# Patient Record
Sex: Female | Born: 1965 | Race: White | Hispanic: No | Marital: Married | Smoking: Former smoker
Health system: Southern US, Community
[De-identification: ages and names within clinical notes are randomized; demographics above are authoritative.]

## PROBLEM LIST (undated history)

## (undated) DIAGNOSIS — M51369 Other intervertebral disc degeneration, lumbar region without mention of lumbar back pain or lower extremity pain: Secondary | ICD-10-CM

## (undated) DIAGNOSIS — I83891 Varicose veins of right lower extremities with other complications: Secondary | ICD-10-CM

## (undated) DIAGNOSIS — M48061 Spinal stenosis, lumbar region without neurogenic claudication: Secondary | ICD-10-CM

## (undated) DIAGNOSIS — I83893 Varicose veins of bilateral lower extremities with other complications: Secondary | ICD-10-CM

## (undated) DIAGNOSIS — K449 Diaphragmatic hernia without obstruction or gangrene: Secondary | ICD-10-CM

## (undated) DIAGNOSIS — R1011 Right upper quadrant pain: Secondary | ICD-10-CM

## (undated) DIAGNOSIS — M47817 Spondylosis without myelopathy or radiculopathy, lumbosacral region: Secondary | ICD-10-CM

## (undated) DIAGNOSIS — M519 Unspecified thoracic, thoracolumbar and lumbosacral intervertebral disc disorder: Secondary | ICD-10-CM

## (undated) DIAGNOSIS — M7989 Other specified soft tissue disorders: Secondary | ICD-10-CM

## (undated) DIAGNOSIS — M5136 Other intervertebral disc degeneration, lumbar region: Secondary | ICD-10-CM

## (undated) DIAGNOSIS — M47816 Spondylosis without myelopathy or radiculopathy, lumbar region: Secondary | ICD-10-CM

## (undated) DIAGNOSIS — I83812 Varicose veins of left lower extremities with pain: Secondary | ICD-10-CM

## (undated) DIAGNOSIS — K5900 Constipation, unspecified: Secondary | ICD-10-CM

## (undated) DIAGNOSIS — F32A Depression, unspecified: Secondary | ICD-10-CM

## (undated) DIAGNOSIS — E7800 Pure hypercholesterolemia, unspecified: Secondary | ICD-10-CM

## (undated) DIAGNOSIS — I341 Nonrheumatic mitral (valve) prolapse: Secondary | ICD-10-CM

## (undated) DIAGNOSIS — R51 Headache: Secondary | ICD-10-CM

## (undated) DIAGNOSIS — K859 Acute pancreatitis without necrosis or infection, unspecified: Secondary | ICD-10-CM

## (undated) DIAGNOSIS — N8110 Cystocele, unspecified: Secondary | ICD-10-CM

## (undated) DIAGNOSIS — R0602 Shortness of breath: Secondary | ICD-10-CM

## (undated) DIAGNOSIS — N809 Endometriosis, unspecified: Secondary | ICD-10-CM

## (undated) DIAGNOSIS — E785 Hyperlipidemia, unspecified: Secondary | ICD-10-CM

## (undated) DIAGNOSIS — I454 Nonspecific intraventricular block: Secondary | ICD-10-CM

## (undated) DIAGNOSIS — G4733 Obstructive sleep apnea (adult) (pediatric): Secondary | ICD-10-CM

## (undated) DIAGNOSIS — E039 Hypothyroidism, unspecified: Secondary | ICD-10-CM

## (undated) DIAGNOSIS — D219 Benign neoplasm of connective and other soft tissue, unspecified: Secondary | ICD-10-CM

## (undated) DIAGNOSIS — K760 Fatty (change of) liver, not elsewhere classified: Secondary | ICD-10-CM

## (undated) DIAGNOSIS — N83519 Torsion of ovary and ovarian pedicle, unspecified side: Secondary | ICD-10-CM

## (undated) DIAGNOSIS — D352 Benign neoplasm of pituitary gland: Secondary | ICD-10-CM

## (undated) DIAGNOSIS — F419 Anxiety disorder, unspecified: Secondary | ICD-10-CM

## (undated) DIAGNOSIS — F319 Bipolar disorder, unspecified: Secondary | ICD-10-CM

## (undated) HISTORY — DX: Obstructive sleep apnea (adult) (pediatric): G47.33

## (undated) HISTORY — DX: Constipation, unspecified: K59.00

## (undated) HISTORY — DX: Anxiety disorder, unspecified: F41.9

## (undated) HISTORY — DX: Hyperlipidemia, unspecified: E78.5

## (undated) HISTORY — PX: DILATION AND CURETTAGE OF UTERUS: SHX78

## (undated) HISTORY — PX: CHOLECYSTECTOMY: SHX55

## (undated) HISTORY — DX: Depression, unspecified: F32.A

## (undated) HISTORY — PX: HERNIA REPAIR: SHX51

## (undated) HISTORY — PX: OTHER SURGICAL HISTORY: SHX169

## (undated) HISTORY — PX: OOPHORECTOMY: SHX86

## (undated) HISTORY — PX: ABDOMINAL HYSTERECTOMY: SHX81

## (undated) HISTORY — DX: Fatty (change of) liver, not elsewhere classified: K76.0

## (undated) HISTORY — PX: APPENDECTOMY: SHX54

## (undated) HISTORY — DX: Benign neoplasm of pituitary gland: D35.2

## (undated) HISTORY — PX: COLONOSCOPY: SHX174

## (undated) MED FILL — OXYCODONE-ACETAMINOPHEN 5-325 TABS: 5-325 MG | 3 days supply | Qty: 12 | Fill #0 | Status: AC

---

## 2012-04-19 ENCOUNTER — Other Ambulatory Visit: Payer: Self-pay | Admitting: Internal Medicine

## 2012-04-19 DIAGNOSIS — Z1231 Encounter for screening mammogram for malignant neoplasm of breast: Secondary | ICD-10-CM

## 2012-05-24 ENCOUNTER — Ambulatory Visit: Payer: Self-pay

## 2012-06-03 ENCOUNTER — Ambulatory Visit: Payer: Self-pay

## 2013-08-16 ENCOUNTER — Other Ambulatory Visit: Payer: Self-pay | Admitting: Obstetrics and Gynecology

## 2013-08-16 DIAGNOSIS — Z1231 Encounter for screening mammogram for malignant neoplasm of breast: Secondary | ICD-10-CM

## 2013-08-31 ENCOUNTER — Ambulatory Visit
Admission: RE | Admit: 2013-08-31 | Discharge: 2013-08-31 | Disposition: A | Discharge status: Home / Self Care | Payer: Federal, State, Local not specified - PPO | Source: Ambulatory Visit | Attending: Obstetrics and Gynecology | Admitting: Obstetrics and Gynecology

## 2013-08-31 DIAGNOSIS — Z1231 Encounter for screening mammogram for malignant neoplasm of breast: Secondary | ICD-10-CM

## 2013-09-05 ENCOUNTER — Encounter (HOSPITAL_COMMUNITY): Payer: Self-pay | Admitting: Emergency Medicine

## 2013-09-05 ENCOUNTER — Emergency Department (HOSPITAL_COMMUNITY): Payer: Federal, State, Local not specified - PPO

## 2013-09-05 ENCOUNTER — Observation Stay (HOSPITAL_COMMUNITY)
Admission: EM | Admit: 2013-09-05 | Discharge: 2013-09-06 | Disposition: A | Discharge status: Home / Self Care | Payer: Federal, State, Local not specified - PPO | Attending: Cardiovascular Disease | Admitting: Cardiovascular Disease

## 2013-09-05 DIAGNOSIS — R0602 Shortness of breath: Secondary | ICD-10-CM | POA: Insufficient documentation

## 2013-09-05 DIAGNOSIS — R42 Dizziness and giddiness: Secondary | ICD-10-CM | POA: Insufficient documentation

## 2013-09-05 DIAGNOSIS — I454 Nonspecific intraventricular block: Secondary | ICD-10-CM | POA: Insufficient documentation

## 2013-09-05 DIAGNOSIS — I059 Rheumatic mitral valve disease, unspecified: Secondary | ICD-10-CM | POA: Insufficient documentation

## 2013-09-05 DIAGNOSIS — I341 Nonrheumatic mitral (valve) prolapse: Secondary | ICD-10-CM | POA: Diagnosis present

## 2013-09-05 DIAGNOSIS — E7800 Pure hypercholesterolemia, unspecified: Secondary | ICD-10-CM

## 2013-09-05 DIAGNOSIS — R079 Chest pain, unspecified: Principal | ICD-10-CM

## 2013-09-05 HISTORY — DX: Pure hypercholesterolemia, unspecified: E78.00

## 2013-09-05 HISTORY — DX: Nonrheumatic mitral (valve) prolapse: I34.1

## 2013-09-05 HISTORY — DX: Nonspecific intraventricular block: I45.4

## 2013-09-05 LAB — BASIC METABOLIC PANEL
BUN: 10 mg/dL (ref 6–23)
CHLORIDE: 102 meq/L (ref 96–112)
CO2: 25 mEq/L (ref 19–32)
Calcium: 9.7 mg/dL (ref 8.4–10.5)
Creatinine, Ser: 0.71 mg/dL (ref 0.50–1.10)
GFR calc non Af Amer: >90 mL/min (ref >90)
Glucose, Bld: 98 mg/dL (ref 70–99)
POTASSIUM: 4.2 meq/L (ref 3.7–5.3)
Sodium: 140 mEq/L (ref 137–147)

## 2013-09-05 LAB — CBC
HCT: 37.3 % (ref 36.0–46.0)
HCT: 34.7 % — ABNORMAL LOW (ref 36.0–46.0)
Hemoglobin: 12.8 g/dL (ref 12.0–15.0)
Hemoglobin: 11.7 g/dL — ABNORMAL LOW (ref 12.0–15.0)
MCH: 28.5 pg (ref 26.0–34.0)
MCH: 28 pg (ref 26.0–34.0)
MCHC: 34.3 g/dL (ref 30.0–36.0)
MCHC: 33.7 g/dL (ref 30.0–36.0)
MCV: 83.1 fL (ref 78.0–100.0)
MCV: 83 fL (ref 78.0–100.0)
PLATELETS: 259 10*3/uL (ref 150–400)
PLATELETS: 242 10*3/uL (ref 150–400)
RBC: 4.49 MIL/uL (ref 3.87–5.11)
RBC: 4.18 MIL/uL (ref 3.87–5.11)
RDW: 13.6 % (ref 11.5–15.5)
RDW: 13.7 % (ref 11.5–15.5)
WBC: 9.7 10*3/uL (ref 4.0–10.5)
WBC: 9.7 10*3/uL (ref 4.0–10.5)

## 2013-09-05 LAB — PROTIME-INR
INR: 1.06 (ref 0.00–1.49)
Prothrombin Time: 13.6 seconds (ref 11.6–15.2)

## 2013-09-05 LAB — D-DIMER, QUANTITATIVE (NOT AT ARMC)

## 2013-09-05 LAB — TROPONIN I
Troponin I: <0.3 ng/mL (ref <0.30)
Troponin I: <0.3 ng/mL (ref <0.30)

## 2013-09-05 LAB — CREATININE, SERUM
CREATININE: 0.79 mg/dL (ref 0.50–1.10)
GFR calc non Af Amer: >90 mL/min (ref >90)

## 2013-09-05 LAB — I-STAT TROPONIN, ED: Troponin i, poc: 0 ng/mL (ref 0.00–0.08)

## 2013-09-05 LAB — APTT: APTT: 29 s (ref 24–37)

## 2013-09-05 LAB — MRSA PCR SCREENING: MRSA by PCR: NEGATIVE

## 2013-09-05 MED ORDER — HEPARIN SODIUM (PORCINE) 5000 UNIT/ML IJ SOLN
5000.0000 [IU] | Freq: Three times a day (TID) | INTRAMUSCULAR | Status: DC
  Administered 2013-09-05 – 2013-09-06 (×3): 5000 [IU] via SUBCUTANEOUS
  Filled 2013-09-05 (×6): qty 1

## 2013-09-05 MED ORDER — LEVOTHYROXINE SODIUM 88 MCG PO TABS
88.0000 ug | ORAL_TABLET | Freq: Every day | ORAL | Status: DC
  Administered 2013-09-06: 88 ug via ORAL
  Filled 2013-09-05 (×2): qty 1

## 2013-09-05 MED ORDER — NITROGLYCERIN 0.4 MG SL SUBL
0.4000 mg | SUBLINGUAL_TABLET | SUBLINGUAL | Status: DC | PRN
  Administered 2013-09-05: 0.4 mg via SUBLINGUAL
  Filled 2013-09-05: qty 1

## 2013-09-05 MED ORDER — ASPIRIN 81 MG PO CHEW
324.0000 mg | CHEWABLE_TABLET | ORAL | Status: AC
  Administered 2013-09-05: 324 mg via ORAL
  Filled 2013-09-05: qty 4

## 2013-09-05 MED ORDER — CLOBETASOL PROPIONATE 0.05 % EX CREA
1.0000 "application " | TOPICAL_CREAM | Freq: Two times a day (BID) | CUTANEOUS | Status: DC
  Administered 2013-09-05: 1 via TOPICAL
  Filled 2013-09-05: qty 15

## 2013-09-05 MED ORDER — LAMOTRIGINE 200 MG PO TABS
200.0000 mg | ORAL_TABLET | Freq: Every day | ORAL | Status: DC
  Administered 2013-09-05: 200 mg via ORAL
  Filled 2013-09-05 (×2): qty 1

## 2013-09-05 MED ORDER — MORPHINE SULFATE 4 MG/ML IJ SOLN
3.0000 mg | INTRAMUSCULAR | Status: DC | PRN
  Administered 2013-09-05: 3 mg via INTRAVENOUS
  Filled 2013-09-05: qty 1

## 2013-09-05 MED ORDER — ACETAMINOPHEN 500 MG PO TABS
1000.0000 mg | ORAL_TABLET | Freq: Once | ORAL | Status: AC
  Administered 2013-09-05: 1000 mg via ORAL
  Filled 2013-09-05: qty 2

## 2013-09-05 MED ORDER — ASPIRIN EC 81 MG PO TBEC: 81.0000 mg | DELAYED_RELEASE_TABLET | Freq: Every day | ORAL | Status: DC

## 2013-09-05 MED ORDER — VENLAFAXINE HCL ER 75 MG PO CP24
75.0000 mg | ORAL_CAPSULE | Freq: Every day | ORAL | Status: DC
  Administered 2013-09-06: 75 mg via ORAL
  Filled 2013-09-05 (×2): qty 1

## 2013-09-05 MED ORDER — ASPIRIN 81 MG PO CHEW
324.0000 mg | CHEWABLE_TABLET | Freq: Once | ORAL | Status: AC
  Administered 2013-09-05: 324 mg via ORAL
  Filled 2013-09-05: qty 4

## 2013-09-05 MED ORDER — NITROGLYCERIN 0.4 MG SL SUBL
0.4000 mg | SUBLINGUAL_TABLET | SUBLINGUAL | Status: AC | PRN
  Administered 2013-09-05 (×3): 0.4 mg via SUBLINGUAL

## 2013-09-05 MED ORDER — ASPIRIN 300 MG RE SUPP
300.0000 mg | RECTAL | Status: AC
  Filled 2013-09-05: qty 1

## 2013-09-05 MED ORDER — ASPIRIN EC 81 MG PO TBEC
81.0000 mg | DELAYED_RELEASE_TABLET | Freq: Every day | ORAL | Status: DC
  Filled 2013-09-05: qty 1

## 2013-09-05 NOTE — ED Notes (Signed)
Pt's husband found pt's cardiologist name: Lendell Caprice.

## 2013-09-05 NOTE — ED Provider Notes (Signed)
CSN: [id number]     Arrival date & time 09/05/13  0815 History   First MD Initiated Contact with Patient 09/05/13 [phone]     Chief Complaint  Patient presents with  . Chest Pain     (Consider location/radiation/quality/duration/timing/severity/associated sxs/prior Treatment) HPI Comments: 48 year old female presents with chest pressure and dyspnea that started about 10:30 pm last night (about 10 hours ago). The pain has been constant but intermittently becomes worse and the pain radiates up to her neck. This lasts only a minute or so. Denies any cough or fever. When the pain is not severe she has a mild, 7/10 pressure, but seems to only occur with inspiration. Denies any leg swelling or leg pain. No recent trips or surgeries. She took times for the pain last night thinking it was GI in origin but this did not help. The pain also seems to be a little bit her back. Patient has a history of mitral valve prolapse and a "bundle branch block" but has not had any coronary problems to her knowledge. She has a cardiologist in [place] but does not know his name.   Past Medical History  Diagnosis Date  . Elevated cholesterol     dx in early 85s  . Mitral valve prolapse     during adolescence   Past Surgical History  Procedure Laterality Date  . Appendectomy    . Cholecystectomy    . Abdominal hysterectomy     No family history on file. History  Substance Use Topics  . Smoking status: Not on file  . Smokeless tobacco: Not on file  . Alcohol Use: Not on file   OB History   Grav Para Term Preterm Abortions TAB SAB Ect Mult Living                 Review of Systems  Constitutional: Negative for fever and diaphoresis.  Respiratory: Positive for shortness of breath. Negative for cough.   Cardiovascular: Positive for chest pain. Negative for leg swelling.  Gastrointestinal: Negative for nausea, vomiting and abdominal pain.  Neurological: Positive for dizziness.  All other systems reviewed  and are negative.      Allergies  Review of patient's allergies indicates not on file.  Home Medications  No current outpatient prescriptions on file. BP 133/75  Pulse 80  Temp(Src) 98.4 F (36.9 C) (Oral)  Resp 14  Ht 5\' 5"  (1.651 m)  Wt 168 lb (76.204 kg)  BMI 27.96 kg/m2  SpO2 100% Physical Exam  Nursing note and vitals reviewed. Constitutional: She is oriented to person, place, and time. She appears well-developed and well-nourished. No distress.  HENT:  Head: Normocephalic and atraumatic.  Right Ear: External ear normal.  Left Ear: External ear normal.  Nose: Nose normal.  Eyes: Right eye exhibits no discharge. Left eye exhibits no discharge.  Cardiovascular: Normal rate, regular rhythm and normal heart sounds.   Pulmonary/Chest: Effort normal and breath sounds normal. She exhibits tenderness (mild sternal tenderness).  Abdominal: Soft. She exhibits no distension. There is no tenderness.  Musculoskeletal: She exhibits no edema.  No calf tenderness or edema  Neurological: She is alert and oriented to person, place, and time.  Skin: Skin is warm and dry.    ED Course  Procedures (including critical care time) Labs Review Labs Reviewed  CBC - Abnormal; Notable for the following:    Hemoglobin 11.7 (*)    HCT 34.7 (*)    All other components within normal limits  MRSA  PCR SCREENING  CBC  BASIC METABOLIC PANEL  D-DIMER, QUANTITATIVE  PROTIME-INR  APTT  CREATININE, SERUM  TROPONIN I  HEMOGLOBIN A1C  TROPONIN I  TROPONIN I  BASIC METABOLIC PANEL  LIPID PANEL  I-STAT TROPOININ, ED   Imaging Review Dg Chest 2 View  09/05/2013   CLINICAL DATA:  Chest pain.  EXAM: CHEST  2 VIEW  COMPARISON:  None.  FINDINGS: The heart size and mediastinal contours are within normal limits. Both lungs are clear. The visualized skeletal structures are unremarkable.  IMPRESSION: No active cardiopulmonary disease.   Electronically Signed   By: Kalman Jewels M.D.   On: 09/05/2013  09:24     EKG Interpretation   Date/Time:  Monday September 05 2013 08:19:59 EDT Ventricular Rate:  89 PR Interval:  172 QRS Duration: 146 QT Interval:  408 QTC Calculation: 496 R Axis:   -53 Text Interpretation:  Normal sinus rhythm Left axis deviation Right bundle  branch block Abnormal ECG No old tracing to compare Confirmed by [place] ([id number]) on 09/05/2013 8:23:17 AM      MDM   Final diagnoses:  Chest pain, unspecified  Mitral valve prolapse    Dr. Irish Lack has seen and will admit for cardiac workup. She is low risk for PE, with negative ddimer I feel this is ruled out as cause of her pleuritic symptoms. Will admit to floor on tele.    Ephraim Hamburger, MD 09/05/13 [phone]

## 2013-09-05 NOTE — H&P (Signed)
Admit date: 09/05/2013 Referring Physician [facility] ED Primary Cardiologist Irish Lack Chief complaint/reason for admission: Chest discomfort  HPI: 48 year old woman with a history of mitral valve prolapse. She had been in her usual state of health until last night. She began having a pressure in the lower part of her chest. She initially thought it was gas. She tried to walk around but the pain persisted. She eventually fell asleep at about 2 in the morning. When she woke up today, her discomfort was still present. There were no factors that seem to make it worse. She decided to come to the emergency room for further evaluation. She received sublingual nitroglycerin glycerin x3. She has had some relief of her discomfort. She did note that it was worse with inspiration. She denies any sweating, palpitations, nausea or vomiting. She has not had any lightheadedness or passing out spells.  Of note, she had an echocardiogram in May of 2013 showing normal LV function and adequate valvular function. She had borderline mitral valve prolapse.    PMH:    Past Medical History  Diagnosis Date  . Elevated cholesterol     dx in early 59s  . Mitral valve prolapse     during adolescence  . Bundle branch block     PSH:    Past Surgical History  Procedure Laterality Date  . Appendectomy    . Cholecystectomy    . Abdominal hysterectomy      ALLERGIES:   Penicillins and Vancomycin  Prior to Admit Meds:   (Not in a hospital admission) Family HX:   History reviewed. No pertinent family history. Social HX:    History   Social History  . Marital Status: Married    Spouse Name: N/A    Number of Children: N/A  . Years of Education: N/A   Occupational History  . Not on file.   Social History Main Topics  . Smoking status: Former Research scientist (life sciences)  . Smokeless tobacco: Never Used  . Alcohol Use: Yes     Comment: couple drinks a week  . Drug Use: No  . Sexual Activity: Yes    Birth Control/ Protection:  Post-menopausal   Other Topics Concern  . Not on file   Social History Narrative  . No narrative on file     ROS:  All 11 ROS were addressed and are negative except what is stated in the HPI  PHYSICAL EXAM Filed Vitals:   09/05/13 1100  BP: 110/70  Pulse: 79  Temp:   Resp: 16   General: Well developed, well nourished, in no acute distress Head:   Normal cephalic and atramatic  Lungs:   Clear bilaterally to auscultation and percussion. Heart:   HRRR S1 S2 Pulses are 2+ & equal.            No carotid bruit. No JVD.   Abdomen: abdomen soft and non-tender  Msk:   Normal strength and tone for age. Extremities: No edema.  DP +1 Neuro: Alert and oriented X 3. Psych:  Normal affect, responds appropriately   Labs:   Lab Results  Component Value Date   WBC 9.7 09/05/2013   HGB 12.8 09/05/2013   HCT 37.3 09/05/2013   MCV 83.1 09/05/2013   PLT 259 09/05/2013    Recent Labs Lab 09/05/13 0854  NA 140  K 4.2  CL 102  CO2 25  BUN 10  CREATININE 0.71  CALCIUM 9.7  GLUCOSE 98   No results found for this basename: CKTOTAL,  CKMB, CKMBINDEX, TROPONINI   No results found for this basename: PTT   No results found for this basename: INR, PROTIME     No results found for this basename: CHOL   No results found for this basename: HDL   No results found for this basename: LDLCALC   No results found for this basename: TRIG   No results found for this basename: CHOLHDL   No results found for this basename: LDLDIRECT      Radiology:  Chest x-ray showed no cardiopulmonary disease  EKG:  Normal sinus rhythm, right bundle branch block  ASSESSMENT: Atypical chest pain, mitral valve prolapse  PLAN:  1) we discussed letting her go home from the emergency room with an outpatient stress test. She is more comfortable with stent in it and having her stress test as an inpatient. Will rule out for MI with enzymes. Watch on telemetry. Plan for stress echo in the morning. Continue  aggressive risk factor modification.  2) echo results from 2013 as noted above. Nothing on exam to indicate severe mitral regurgitation. No heart failure symptoms.   3) Baseline ECG with right bundle branch block. This is unchanged from 2013.  Jettie Booze., MD  09/05/2013  11:40 AM

## 2013-09-05 NOTE — ED Notes (Signed)
Lactic acid and troponin results given to Dr. Bednar 

## 2013-09-05 NOTE — ED Notes (Addendum)
Pt c/o chest pain starting 10:30 last night.  It's located middle of chest and radiates up to the throat and to back.  Pt c/o of lightheadedness and hands swelling.  Denies n/v and diaphoresis.  Pt describes pain as pressure and tightness and difficult to take a deep breath.  Hx mitral valve prolapse and bundle branch block.  Denies hx HTN and DM.  Pt states she has had similar pain before but did not seek treatment (about a month or two ago). Previous episode last 1-2 hours.  Hx depression but never diagnosed with anxiety.   No apparent distress, respirations equal and unlabored, skin warm and dry.

## 2013-09-06 DIAGNOSIS — R079 Chest pain, unspecified: Secondary | ICD-10-CM

## 2013-09-06 DIAGNOSIS — E7800 Pure hypercholesterolemia, unspecified: Secondary | ICD-10-CM

## 2013-09-06 LAB — BASIC METABOLIC PANEL
BUN: 11 mg/dL (ref 6–23)
CALCIUM: 8.8 mg/dL (ref 8.4–10.5)
CO2: 25 mEq/L (ref 19–32)
Chloride: 104 mEq/L (ref 96–112)
Creatinine, Ser: 0.81 mg/dL (ref 0.50–1.10)
GFR calc Af Amer: >90 mL/min (ref >90)
GFR calc non Af Amer: 85 mL/min — ABNORMAL LOW (ref >90)
GLUCOSE: 92 mg/dL (ref 70–99)
Potassium: 4.3 mEq/L (ref 3.7–5.3)
Sodium: 142 mEq/L (ref 137–147)

## 2013-09-06 LAB — LIPID PANEL
Cholesterol: 203 mg/dL — ABNORMAL HIGH (ref 0–200)
Cholesterol: 218 mg/dL — ABNORMAL HIGH (ref 0–200)
HDL: 33 mg/dL — AB (ref >39)
HDL: 30 mg/dL — AB (ref >39)
LDL CALC: UNDETERMINED mg/dL (ref 0–99)
LDL Cholesterol: UNDETERMINED mg/dL (ref 0–99)
Total CHOL/HDL Ratio: 6.2 RATIO
Total CHOL/HDL Ratio: 7.3 RATIO
Triglycerides: 539 mg/dL — ABNORMAL HIGH (ref <150)
Triglycerides: 623 mg/dL — ABNORMAL HIGH (ref <150)
VLDL: UNDETERMINED mg/dL (ref 0–40)
VLDL: UNDETERMINED mg/dL (ref 0–40)

## 2013-09-06 LAB — TROPONIN I: Troponin I: <0.3 ng/mL (ref <0.30)

## 2013-09-06 LAB — HEMOGLOBIN A1C
HEMOGLOBIN A1C: 5.4 % (ref <5.7)
Mean Plasma Glucose: 108 mg/dL (ref <117)

## 2013-09-06 LAB — LIPASE, BLOOD: LIPASE: 37 U/L (ref 11–59)

## 2013-09-06 LAB — AMYLASE: AMYLASE: 76 U/L (ref 0–105)

## 2013-09-06 MED ORDER — NITROGLYCERIN 0.4 MG SL SUBL: 0.4000 mg | SUBLINGUAL_TABLET | SUBLINGUAL | Status: DC | PRN

## 2013-09-06 NOTE — Progress Notes (Addendum)
Reviewed discharge instructions with patient and she stated her understanding.  Patient knows to followup lipid results at followup appointment.  Patient wanted to wait for her amalyse and lipase results prior to discharge.  Awaiting ride home for discharge.  Alyssa Holmes

## 2013-09-06 NOTE — Discharge Summary (Signed)
See full note this am. cdm 

## 2013-09-06 NOTE — Discharge Summary (Signed)
Discharge Summary   Patient ID: Alyssa Holmes MRN: [id number], DOB/AGE: 48/09/1965 48 y.o. Admit date: 09/05/2013 D/C date:     09/06/2013  Primary Cardiologist: Dr. Irish Lack  Primary Problem:  Chest pain, unspecified Active Problems:   Elevated cholesterol/Hypertriglyceridemia    Mitral valve prolapse       Admission Dates: 09/05/13- 09/06/13 Discharge Diagnosis: Chest pain, non-cardiac  Hospital Course: 48 year old woman with a history of HLD, mitral valve prolapse and no other past cardiac history who presented to the [facility] ED on 09/06/13 with chest pain.   Chest pain- atypical in nature. It was described as a pressure that was worse with inspiration and only partially relieved with NTG x3. She was admitted by Dr. Irish Lack 09/06/13 and given the option to go home with outpatient stress testing or stay overnight for observation with stress ECHO in the AM. She elected to be admitted. She ruled out for MI. Her troponin remained negative x3 and EKG with no acute ST or T wave changes. She does have a right bundle branch block at baseline, which is unchanged from 2013. She underwent stress testing today which was without ischemia or EKG changes. She exercised for 8 minutes with no chest pain. Of note, a D-Dimer to rule out PE was done in the setting of atypical chest pain worse with inspiration. This returned normal.   Hyperlipidemia/hypertriglyceridemia- Lipid panel on this admission revealed, TC: 203, TG: 539, HDL;33; LDL unable to be calculated with this TG level. These labs resulted at 1am. The patient admited to eating around 10pm the night before. A repeat fasting lipid panel was ordered which is still pending. The patient was concerned about pancreatitis with elevated TG. Clinically, her presentation was not consistent with pancreatitis without N/V/abd pain but a amylase/lipase level was checked, which are also pending. For now, she will continue home niacin and elevated cholesterol levels  can be addressed as an outpatient.  MVP- echo results from 2013 showing normal LV function and adequate valvular function. She had borderline mitral valve prolapse. There was nothing on her exam to indicate severe mitral regurgitation and she did not have any heart failure symptoms.   The patient has had an uncomplicated hospital course and is recovering well. She has been seen by Dr. Julianne Handler today and deemed stable for discharge home. All follow-up appointments have been scheduled.  Discharge medications include ASA, niacin and NTG. She will be discharged after lab draw and lunch and it was felt that she did not need to wait for amylase and lipase to result before going home.    Discharge Vitals: Blood pressure 112/69, pulse 72, temperature 98 F (36.7 C), temperature source Oral, resp. rate 18, height 5\' 5"  (1.651 m), weight 169 lb 3.2 oz (76.749 kg), SpO2 99.00%.  Labs: Lab Results  Component Value Date   WBC 9.7 09/05/2013   HGB 11.7* 09/05/2013   HCT 34.7* 09/05/2013   MCV 83.0 09/05/2013   PLT 242 09/05/2013     Recent Labs Lab 09/06/13 0103  NA 142  K 4.3  CL 104  CO2 25  BUN 11  CREATININE 0.81  CALCIUM 8.8  GLUCOSE 92    Recent Labs  09/05/13 1305 09/05/13 1834 09/06/13 0103  TROPONINI <0.30 <0.30 <0.30   Lab Results  Component Value Date   CHOL 203* 09/06/2013   HDL 33* 09/06/2013   LDLCALC UNABLE TO CALCULATE IF TRIGLYCERIDE OVER 400 mg/dL 09/06/2013   TRIG 539* 09/06/2013   Lab Results  Component  Value Date   DDIMER <0.27 09/05/2013    Diagnostic Studies/Procedures   Dg Chest 2 View  09/05/2013   CLINICAL DATA:  Chest pain.  EXAM: CHEST  2 VIEW  COMPARISON:  None.  FINDINGS: The heart size and mediastinal contours are within normal limits. Both lungs are clear. The visualized skeletal structures are unremarkable.  IMPRESSION: No active cardiopulmonary disease.      Stress Echocardiography: 09/06/2013 Study Conclusions - Stress ECG conclusions: The  stress ECG showed no significant ST or T wave changes indicative of ischemia. - Staged echo: Normal echo stress Impressions: - Low risk study after maximal exercise. Normal EF. Recommendations: This procedure has been discussed with the referring physician. Bruce protocol. Stress echocardiography. Height: Height: 165.1cm. Height: 65in. Weight: Weight: 76.8kg. Weight: 169lb. Body mass index: BMI: 28.2kg/m^2. Body surface area: BSA: 1.26m^2. Blood pressure: 112/69. Patient status: Inpatient. ----------------------------------------------------------- ------------------------------------------------------------ Baseline ECG: Normal sinus rhythm with right bundle branch block. Nonspecific ST and T wave changes. ------------------------------------------------------------ Stress protocol: +---------------+---+-----------+--------+ Stage HR BP (mmHg) Symptoms +---------------+---+-----------+--------+ Baseline 85 113/77 (89)None  +---------------+---+-----------+--------+ Stage 1 136140/69 (93)-------- +---------------+---+-----------+--------+ Stage 2 157148/66 (93)-------- +---------------+---+-----------+--------+ Stage 3 169------------------- +---------------+---+-----------+--------+ Recovery; 1 [id number] (87)-------- +---------------+---+-----------+--------+ ------------------------------------------------------------ Stress results: Maximal heart rate during stress was 169bpm (103% of maximal predicted heart rate). The target heart rate was achieved. The heart rate response to stress was normal. There was a normal resting blood pressure with an appropriate response to stress. The rate-pressure product for the peak heart rate and blood pressure was 23259mm Hg/min. The patient experienced no chest pain during stress. ------------------------------------------------------------ Stress ECG: The stress ECG showed no significant ST or T wave  changes indicative of ischemia. ------------------------------------------------------------ Baseline: Peak stress: ------------------------------------------------------------ Stress echo results: Left ventricular ejection fraction was normal at rest and with stress. Normal echo stress     Discharge Medications     Medication List         Apple Cider Vinegar Tabs  Take 1 tablet by mouth daily.     aspirin 81 MG tablet  Take 81 mg by mouth daily.     BIOTIN PO  Take 1 tablet by mouth daily.     CINNAMON PO  Take 1 tablet by mouth daily.     CLOBETASOL PROPIONATE E 0.05 % emollient cream  Generic drug:  Clobetasol Prop Emollient Base  Apply 1 application topically 2 (two) times daily.     lamoTRIgine 200 MG tablet  Commonly known as:  LAMICTAL  Take 200 mg by mouth daily.     levothyroxine 88 MCG tablet  Commonly known as:  SYNTHROID, LEVOTHROID  Take 88 mcg by mouth daily before breakfast.     MAGNESIUM PO  Take 1 tablet by mouth daily.     multivitamin with minerals tablet  Take 1 tablet by mouth daily.     NIACIN PO  Take 1 tablet by mouth daily.     nitroGLYCERIN 0.4 MG SL tablet  Commonly known as:  NITROSTAT  Place 1 tablet (0.4 mg total) under the tongue every 5 (five) minutes x 3 doses as needed for chest pain.     venlafaxine XR 75 MG 24 hr capsule  Commonly known as:  EFFEXOR-XR  Take 75 mg by mouth daily with breakfast.        Disposition   The patient will be discharged in stable condition to home.  Future Appointments Provider Department Dept Phone   09/29/2013 8:00 AM Burtis Junes, NP [address] Office [phone]  Follow-up Information   Follow up with Truitt Merle, NP On 09/29/2013. (8am)    Specialty:  Nurse Practitioner   Contact information:   [address] [place] [address] [phone]         Duration of Discharge Encounter: Greater than 30 minutes including physician and PA  time.  Tretha Sciara, Analina Filla PA-C 09/06/2013, 12:43 PM

## 2013-09-06 NOTE — Discharge Instructions (Signed)
Chest Pain (Nonspecific) Chest pain has many causes. Your pain could be caused by something serious, such as a heart attack or a blood clot in the lungs. It could also be caused by something less serious, such as a chest bruise or a virus. Follow up with your doctor. More lab tests or other studies may be needed to find the cause of your pain. Most of the time, nonspecific chest pain will improve within 2 to 3 days of rest and mild pain medicine. HOME CARE  For chest bruises, you may put ice on the sore area for 15-20 minutes, 03-04 times a day. Do this only if it makes you feel better.  Put ice in a plastic bag.  Place a towel between the skin and the bag.  Rest for the next 2 to 3 days.  Go back to work if the pain improves.  See your doctor if the pain lasts longer than 1 to 2 weeks.  Only take medicine as told by your doctor.  Quit smoking if you smoke. GET HELP RIGHT AWAY IF:   There is more pain or pain that spreads to the arm, neck, jaw, back, or belly (abdomen).  You have shortness of breath.  You cough more than usual or cough up blood.  You have very bad back or belly pain, feel sick to your stomach (nauseous), or throw up (vomit).  You have very bad weakness.  You pass out (faint).  You have a fever. Any of these problems may be serious and may be an emergency. Do not wait to see if the problems will go away. Get medical help right away. Call your local emergency services 911 in [place] Do not drive yourself to the hospital. MAKE SURE YOU:   Understand these instructions.  Will watch this condition.  Will get help right away if you or your child is not doing well or gets worse. Document Released: 11/19/2007 Document Revised: 08/25/2011 Document Reviewed: 11/19/2007 [facility] Patient Information 2014 [place], [place].  Fat and Cholesterol Control Diet Fat and cholesterol levels in your blood and organs are influenced by your diet. High levels of fat and cholesterol  may lead to diseases of the heart, small and large blood vessels, gallbladder, liver, and pancreas. CONTROLLING FAT AND CHOLESTEROL WITH DIET Although exercise and lifestyle factors are important, your diet is key. That is because certain foods are known to raise cholesterol and others to lower it. The goal is to balance foods for their effect on cholesterol and more importantly, to replace saturated and trans fat with other types of fat, such as monounsaturated fat, polyunsaturated fat, and omega-3 fatty acids. On average, a person should consume no more than 15 to 17 g of saturated fat daily. Saturated and trans fats are considered "bad" fats, and they will raise LDL cholesterol. Saturated fats are primarily found in animal products such as meats, butter, and cream. However, that does not mean you need to give up all your favorite foods. Today, there are good tasting, low-fat, low-cholesterol substitutes for most of the things you like to eat. Choose low-fat or nonfat alternatives. Choose round or loin cuts of red meat. These types of cuts are lowest in fat and cholesterol. Chicken (without the skin), fish, veal, and ground [place] breast are great choices. Eliminate fatty meats, such as hot dogs and salami. Even shellfish have little or no saturated fat. Have a 3 oz (85 g) portion when you eat lean meat, poultry, or fish. Trans fats  are also called "partially hydrogenated oils." They are oils that have been scientifically manipulated so that they are solid at room temperature resulting in a longer shelf life and improved taste and texture of foods in which they are added. Trans fats are found in stick margarine, some tub margarines, cookies, crackers, and baked goods.  When baking and cooking, oils are a great substitute for butter. The monounsaturated oils are especially beneficial since it is believed they lower LDL and raise HDL. The oils you should avoid entirely are saturated tropical oils, such as  coconut and palm.  Remember to eat a lot from food groups that are naturally free of saturated and trans fat, including fish, fruit, vegetables, beans, grains (barley, rice, couscous, bulgur wheat), and pasta (without cream sauces).  IDENTIFYING FOODS THAT LOWER FAT AND CHOLESTEROL  Soluble fiber may lower your cholesterol. This type of fiber is found in fruits such as apples, vegetables such as broccoli, potatoes, and carrots, legumes such as beans, peas, and lentils, and grains such as barley. Foods fortified with plant sterols (phytosterol) may also lower cholesterol. You should eat at least 2 g per day of these foods for a cholesterol lowering effect.  Read package labels to identify low-saturated fats, trans fat free, and low-fat foods at the supermarket. Select cheeses that have only 2 to 3 g saturated fat per ounce. Use a heart-healthy tub margarine that is free of trans fats or partially hydrogenated oil. When buying baked goods (cookies, crackers), avoid partially hydrogenated oils. Breads and muffins should be made from whole grains (whole-wheat or whole oat flour, instead of "flour" or "enriched flour"). Buy non-creamy canned soups with reduced salt and no added fats.  FOOD PREPARATION TECHNIQUES  Never deep-fry. If you must fry, either stir-fry, which uses very little fat, or use non-stick cooking sprays. When possible, broil, bake, or roast meats, and steam vegetables. Instead of putting butter or margarine on vegetables, use lemon and herbs, applesauce, and cinnamon (for squash and sweet potatoes). Use nonfat yogurt, salsa, and low-fat dressings for salads.  LOW-SATURATED FAT / LOW-FAT FOOD SUBSTITUTES Meats / Saturated Fat (g)  Avoid: Steak, marbled (3 oz/85 g) / 11 g  Choose: Steak, lean (3 oz/85 g) / 4 g  Avoid: Hamburger (3 oz/85 g) / 7 g  Choose: Hamburger, lean (3 oz/85 g) / 5 g  Avoid: Ham (3 oz/85 g) / 6 g  Choose: Ham, lean cut (3 oz/85 g) / 2.4 g  Avoid: Chicken, with  skin, dark meat (3 oz/85 g) / 4 g  Choose: Chicken, skin removed, dark meat (3 oz/85 g) / 2 g  Avoid: Chicken, with skin, light meat (3 oz/85 g) / 2.5 g  Choose: Chicken, skin removed, light meat (3 oz/85 g) / 1 g Dairy / Saturated Fat (g)  Avoid: Whole milk (1 cup) / 5 g  Choose: Low-fat milk, 2% (1 cup) / 3 g  Choose: Low-fat milk, 1% (1 cup) / 1.5 g  Choose: Skim milk (1 cup) / 0.3 g  Avoid: Hard cheese (1 oz/28 g) / 6 g  Choose: Skim milk cheese (1 oz/28 g) / 2 to 3 g  Avoid: Cottage cheese, 4% fat (1 cup) / 6.5 g  Choose: Low-fat cottage cheese, 1% fat (1 cup) / 1.5 g  Avoid: Ice cream (1 cup) / 9 g  Choose: Sherbet (1 cup) / 2.5 g  Choose: Nonfat frozen yogurt (1 cup) / 0.3 g  Choose: Frozen fruit bar / trace  Avoid: Whipped cream (1 tbs) / 3.5 g  Choose: Nondairy whipped topping (1 tbs) / 1 g Condiments / Saturated Fat (g)  Avoid: Mayonnaise (1 tbs) / 2 g  Choose: Low-fat mayonnaise (1 tbs) / 1 g  Avoid: Butter (1 tbs) / 7 g  Choose: Extra light margarine (1 tbs) / 1 g  Avoid: Coconut oil (1 tbs) / 11.8 g  Choose: Olive oil (1 tbs) / 1.8 g  Choose: Corn oil (1 tbs) / 1.7 g  Choose: Safflower oil (1 tbs) / 1.2 g  Choose: Sunflower oil (1 tbs) / 1.4 g  Choose: Soybean oil (1 tbs) / 2.4 g  Choose: Canola oil (1 tbs) / 1 g Document Released: 06/02/2005 Document Revised: 09/27/2012 Document Reviewed: 11/21/2010 [employer] Patient Information 2014 [place], [place].  Hypertriglyceridemia  Diet for High blood levels of Triglycerides Most fats in food are triglycerides. Triglycerides in your blood are stored as fat in your body. High levels of triglycerides in your blood may put you at a greater risk for heart disease and stroke.  Normal triglyceride levels are less than 150 mg/dL. Borderline high levels are 150-199 mg/dl. High levels are 200 - 499 mg/dL, and very high triglyceride levels are greater than 500 mg/dL. The decision to treat high triglycerides  is generally based on the level. For people with borderline or high triglyceride levels, treatment includes weight loss and exercise. Drugs are recommended for people with very high triglyceride levels. Many people who need treatment for high triglyceride levels have metabolic syndrome. This syndrome is a collection of disorders that often include: insulin resistance, high blood pressure, blood clotting problems, high cholesterol and triglycerides. TESTING PROCEDURE FOR TRIGLYCERIDES  You should not eat 4 hours before getting your triglycerides measured. The normal range of triglycerides is between 10 and 250 milligrams per deciliter (mg/dl). Some people may have extreme levels (1000 or above), but your triglyceride level may be too high if it is above 150 mg/dl, depending on what other risk factors you have for heart disease.  People with high blood triglycerides may also have high blood cholesterol levels. If you have high blood cholesterol as well as high blood triglycerides, your risk for heart disease is probably greater than if you only had high triglycerides. High blood cholesterol is one of the main risk factors for heart disease. CHANGING YOUR DIET  Your weight can affect your blood triglyceride level. If you are more than 20% above your ideal body weight, you may be able to lower your blood triglycerides by losing weight. Eating less and exercising regularly is the best way to combat this. Fat provides more calories than any other food. The best way to lose weight is to eat less fat. Only 30% of your total calories should come from fat. Less than 7% of your diet should come from saturated fat. A diet low in fat and saturated fat is the same as a diet to decrease blood cholesterol. By eating a diet lower in fat, you may lose weight, lower your blood cholesterol, and lower your blood triglyceride level.  Eating a diet low in fat, especially saturated fat, may also help you lower your blood  triglyceride level. Ask your dietitian to help you figure how much fat you can eat based on the number of calories your caregiver has prescribed for you.  Exercise, in addition to helping with weight loss may also help lower triglyceride levels.   Alcohol can increase blood triglycerides. You may need to stop  drinking alcoholic beverages.  Too much carbohydrate in your diet may also increase your blood triglycerides. Some complex carbohydrates are necessary in your diet. These may include bread, rice, potatoes, other starchy vegetables and cereals.  Reduce "simple" carbohydrates. These may include pure sugars, candy, honey, and jelly without losing other nutrients. If you have the kind of high blood triglycerides that is affected by the amount of carbohydrates in your diet, you will need to eat less sugar and less high-sugar foods. Your caregiver can help you with this.  Adding 2-4 grams of fish oil (EPA+ DHA) may also help lower triglycerides. Speak with your caregiver before adding any supplements to your regimen. Following the Diet  Maintain your ideal weight. Your caregivers can help you with a diet. Generally, eating less food and getting more exercise will help you lose weight. Joining a weight control group may also help. Ask your caregivers for a good weight control group in your area.  Eat low-fat foods instead of high-fat foods. This can help you lose weight too.  These foods are lower in fat. Eat MORE of these:   Dried beans, peas, and lentils.  Egg whites.  Low-fat cottage cheese.  Fish.  Lean cuts of meat, such as round, sirloin, rump, and flank (cut extra fat off meat you fix).  Whole grain breads, cereals and pasta.  Skim and nonfat dry milk.  Low-fat yogurt.  Poultry without the skin.  Cheese made with skim or part-skim milk, such as mozzarella, parmesan, farmers', ricotta, or pot cheese. These are higher fat foods. Eat LESS of these:   Whole milk and foods made  from whole milk, such as American, blue, cheddar, monterey jack, and swiss cheese  High-fat meats, such as luncheon meats, sausages, knockwurst, bratwurst, hot dogs, ribs, corned beef, ground pork, and regular ground beef.  Fried foods. Limit saturated fats in your diet. Substituting unsaturated fat for saturated fat may decrease your blood triglyceride level. You will need to read package labels to know which products contain saturated fats.  These foods are high in saturated fat. Eat LESS of these:   Fried pork skins.  Whole milk.  Skin and fat from poultry.  Palm oil.  Butter.  Shortening.  Cream cheese.  Berniece Salines.  Margarines and baked goods made from listed oils.  Vegetable shortenings.  Chitterlings.  Fat from meats.  Coconut oil.  Palm kernel oil.  Lard.  Cream.  Sour cream.  Fatback.  Coffee whiteners and non-dairy creamers made with these oils.  Cheese made from whole milk. Use unsaturated fats (both polyunsaturated and monounsaturated) moderately. Remember, even though unsaturated fats are better than saturated fats; you still want a diet low in total fat.  These foods are high in unsaturated fat:   Canola oil.  Sunflower oil.  Mayonnaise.  Almonds.  Peanuts.  Pine nuts.  Margarines made with these oils.  Safflower oil.  Olive oil.  Avocados.  Cashews.  Peanut butter.  Sunflower seeds.  Soybean oil.  Peanut oil.  Olives.  Pecans.  Walnuts.  Pumpkin seeds. Avoid sugar and other high-sugar foods. This will decrease carbohydrates without decreasing other nutrients. Sugar in your food goes rapidly to your blood. When there is excess sugar in your blood, your liver may use it to make more triglycerides. Sugar also contains calories without other important nutrients.  Eat LESS of these:   Sugar, brown sugar, powdered sugar, jam, jelly, preserves, honey, syrup, molasses, pies, candy, cakes, cookies, frosting, pastries, colas,  soft  drinks, punches, fruit drinks, and regular gelatin.  Avoid alcohol. Alcohol, even more than sugar, may increase blood triglycerides. In addition, alcohol is high in calories and low in nutrients. Ask for sparkling water, or a diet soft drink instead of an alcoholic beverage. Suggestions for planning and preparing meals   Bake, broil, grill or roast meats instead of frying.  Remove fat from meats and skin from poultry before cooking.  Add spices, herbs, lemon juice or vinegar to vegetables instead of salt, rich sauces or gravies.  Use a non-stick skillet without fat or use no-stick sprays.  Cool and refrigerate stews and broth. Then remove the hardened fat floating on the surface before serving.  Refrigerate meat drippings and skim off fat to make low-fat gravies.  Serve more fish.  Use less butter, margarine and other high-fat spreads on bread or vegetables.  Use skim or reconstituted non-fat dry milk for cooking.  Cook with low-fat cheeses.  Substitute low-fat yogurt or cottage cheese for all or part of the sour cream in recipes for sauces, dips or congealed salads.  Use half yogurt/half mayonnaise in salad recipes.  Substitute evaporated skim milk for cream. Evaporated skim milk or reconstituted non-fat dry milk can be whipped and substituted for whipped cream in certain recipes.  Choose fresh fruits for dessert instead of high-fat foods such as pies or cakes. Fruits are naturally low in fat. When Dining Out   Order low-fat appetizers such as fruit or vegetable juice, pasta with vegetables or tomato sauce.  Select clear, rather than cream soups.  Ask that dressings and gravies be served on the side. Then use less of them.  Order foods that are baked, broiled, poached, steamed, stir-fried, or roasted.  Ask for margarine instead of butter, and use only a small amount.  Drink sparkling water, unsweetened tea or coffee, or diet soft drinks instead of alcohol or other  sweet beverages. QUESTIONS AND ANSWERS ABOUT OTHER FATS IN THE BLOOD: SATURATED FAT, TRANS FAT, AND CHOLESTEROL What is trans fat? Trans fat is a type of fat that is formed when vegetable oil is hardened through a process called hydrogenation. This process helps makes foods more solid, gives them shape, and prolongs their shelf life. Trans fats are also called hydrogenated or partially hydrogenated oils.  What do saturated fat, trans fat, and cholesterol in foods have to do with heart disease? Saturated fat, trans fat, and cholesterol in the diet all raise the level of LDL "bad" cholesterol in the blood. The higher the LDL cholesterol, the greater the risk for coronary heart disease (CHD). Saturated fat and trans fat raise LDL similarly.  What foods contain saturated fat, trans fat, and cholesterol? High amounts of saturated fat are found in animal products, such as fatty cuts of meat, chicken skin, and full-fat dairy products like butter, whole milk, cream, and cheese, and in tropical vegetable oils such as palm, palm kernel, and coconut oil. Trans fat is found in some of the same foods as saturated fat, such as vegetable shortening, some margarines (especially hard or stick margarine), crackers, cookies, baked goods, fried foods, salad dressings, and other processed foods made with partially hydrogenated vegetable oils. Small amounts of trans fat also occur naturally in some animal products, such as milk products, beef, and lamb. Foods high in cholesterol include liver, other organ meats, egg yolks, shrimp, and full-fat dairy products. How can I use the new food label to make heart-healthy food choices? Check the Nutrition Facts panel of the  food label. Choose foods lower in saturated fat, trans fat, and cholesterol. For saturated fat and cholesterol, you can also use the Percent Daily Value (%DV): 5% DV or less is low, and 20% DV or more is high. (There is no %DV for trans fat.) Use the Nutrition Facts  panel to choose foods low in saturated fat and cholesterol, and if the trans fat is not listed, read the ingredients and limit products that list shortening or hydrogenated or partially hydrogenated vegetable oil, which tend to be high in trans fat. POINTS TO REMEMBER:   Discuss your risk for heart disease with your caregivers, and take steps to reduce risk factors.  Change your diet. Choose foods that are low in saturated fat, trans fat, and cholesterol.  Add exercise to your daily routine if it is not already being done. Participate in physical activity of moderate intensity, like brisk walking, for at least 30 minutes on most, and preferably all days of the week. No time? Break the 30 minutes into three, 10-minute segments during the day.  Stop smoking. If you do smoke, contact your caregiver to discuss ways in which they can help you quit.  Do not use street drugs.  Maintain a normal weight.  Maintain a healthy blood pressure.  Keep up with your blood work for checking the fats in your blood as directed by your caregiver. Document Released: 03/20/2004 Document Revised: 12/02/2011 Document Reviewed: 10/16/2008 [facility] Patient Information 2014 [place].

## 2013-09-06 NOTE — Progress Notes (Signed)
Pt c/o midsternal chest pain, radiating to her back. Pain 6/10 associated with sob and mild diaphoresis, BP 125/65. Pt given 1 sublingual nitroglycerin, which brought pain down to 4/10, BP 100/59 Pain increased back to a 7/10 a few minutes later. EKG obtained, no acute changed noted. Dr. Aundra Dubin paged and ordered IV morphine.   IV morphine given with complete relief of symptoms.

## 2013-09-06 NOTE — Progress Notes (Signed)
Patient Name: Alyssa Holmes Date of Encounter: 09/06/2013     Active Problems:   Chest pain, unspecified    SUBJECTIVE   Feeling good. Last episode of CP last night: 7/10--> 0/10 w/ NTG and morphine. Has a history of HLD with hyperTG.  CURRENT MEDS . aspirin EC  81 mg Oral Daily  . clobetasol cream  1 application Topical BID  . heparin  5,000 Units Subcutaneous 3 times per day  . lamoTRIgine  200 mg Oral Daily  . levothyroxine  88 mcg Oral QAC breakfast  . venlafaxine XR  75 mg Oral Q breakfast    OBJECTIVE  Filed Vitals:   09/05/13 2150 09/05/13 2155 09/05/13 2209 09/06/13 0554  BP: 100/59 101/67 114/63 112/69  Pulse: 81   72  Temp:    98 F (36.7 C)  TempSrc:    Oral  Resp:    18  Height:      Weight:      SpO2:    99%    Intake/Output Summary (Last 24 hours) at 09/06/13 0749 Last data filed at 09/05/13 1700  Gross per 24 hour  Intake    240 ml  Output      0 ml  Net    240 ml   Filed Weights   09/05/13 0824 09/05/13 0835 09/05/13 1410  Weight: 168 lb (76.204 kg) 168 lb (76.204 kg) 169 lb 3.2 oz (76.749 kg)    PHYSICAL EXAM  General: Pleasant, NAD. Neuro: Alert and oriented X 3. Moves all extremities spontaneously. Psych: Normal affect. HEENT:  Normal  Neck: Supple without bruits or JVD. Lungs:  Resp regular and unlabored, CTA. Heart: RRR no s3, s4, or murmurs. Abdomen: Soft, non-tender, non-distended, BS + x 4.  Extremities: No clubbing, cyanosis or edema. DP/PT/Radials 2+ and equal bilaterally.  Accessory Clinical Findings  CBC  Recent Labs  09/05/13 0854 09/05/13 1305  WBC 9.7 9.7  HGB 12.8 11.7*  HCT 37.3 34.7*  MCV 83.1 83.0  PLT 259 734   Basic Metabolic Panel  Recent Labs  09/05/13 0854 09/05/13 1305 09/06/13 0103  NA 140  --  142  K 4.2  --  4.3  CL 102  --  104  CO2 25  --  25  GLUCOSE 98  --  92  BUN 10  --  11  CREATININE 0.71 0.79 0.81  CALCIUM 9.7  --  8.8   Cardiac Enzymes  Recent Labs  09/05/13 1305  09/05/13 1834 09/06/13 0103  TROPONINI <0.30 <0.30 <0.30     D-Dimer  Recent Labs  09/05/13 0854  DDIMER <0.27    Fasting Lipid Panel  Recent Labs  09/06/13 0103  CHOL 203*  HDL 33*  LDLCALC UNABLE TO CALCULATE IF TRIGLYCERIDE OVER 400 mg/dL  TRIG 539*  CHOLHDL 6.2     TELE  nsr rbbb  Radiology/Studies  Dg Chest 2 View  09/05/2013   CLINICAL DATA:  Chest pain.  EXAM: CHEST  2 VIEW  COMPARISON:  None.  FINDINGS: The heart size and mediastinal contours are within normal limits. Both lungs are clear. The visualized skeletal structures are unremarkable.  IMPRESSION: No active cardiopulmonary disease.     Mm Digital Screening Bilateral  09/02/2013   CLINICAL DATA:  Screening.  EXAM: DIGITAL SCREENING BILATERAL MAMMOGRAM WITH CAD  COMPARISON:  Previous exam(s).  ACR Breast Density Category c: The breast tissue is heterogeneously dense, which may obscure small masses.  FINDINGS: There are no findings suspicious for malignancy.  Images were processed with CAD.  IMPRESSION: No mammographic evidence of malignancy. A result letter of this screening mammogram will be mailed directly to the patient.  RECOMMENDATION: Screening mammogram in one year   ASSESSMENT AND PLAN 48 year old woman with a history of HLD, mitral valve prolapse and no other past cardiac history who presented to the [facility] ED on 09/06/13 with chest pain.   Chest pain- atypical. She was admitted by Dr. Irish Lack yesterday and given the option to go home with outpatient stress test or stay for observation overnight with stress ECHO in the AM. She elected to be admitted. She will undergo stress ECHO this morning and if negative she can likely go home. --  She complained of pain worse with inspiration. D-Dimer negative making PE less likely.  -- Troponin x3 negative. EKG with no acute ST or T wave changes.  -- She does have a right bundle branch block. This is unchanged from 2013.  Hyperlipidemia/hypertriglyceridemia. Not  fasting, resulted at 1am. Ate around 10pm.  -- Repeat fasting. Continue home niacin.   MVP- echo results from 2013 showing normal LV function and adequate valvular function. She had borderline mitral valve prolapse. -- Nothing on exam to indicate severe mitral regurgitation. No heart failure symptoms.     Tyrell Antonio PA-C  Pager [phone]  I have personally seen and examined this patient with Perry Mount, PA-C. I agree with the assessment and plan as outlined above. Stress echo without ischemia or EKG changes. She exercised for 8 minutes with no chest pain. She is concerned about pancreatitis with elevated TG. Clinically not c/w pancreatitis without N/V/abd pain but will check amylase/lipase. She can be discharged after lab draw and lunch. Does not need to wait for amylase and lipase to result. Follow up with Dr. Irish Lack in 3-4 weeks.   Scorpio Fortin 11:32 AM 09/06/2013

## 2013-09-08 ENCOUNTER — Encounter (HOSPITAL_COMMUNITY): Payer: Self-pay | Admitting: *Deleted

## 2013-09-09 ENCOUNTER — Other Ambulatory Visit: Payer: Self-pay | Admitting: Gastroenterology

## 2013-09-09 ENCOUNTER — Encounter (HOSPITAL_COMMUNITY): Payer: Federal, State, Local not specified - PPO | Admitting: Anesthesiology

## 2013-09-09 ENCOUNTER — Encounter (HOSPITAL_COMMUNITY): Payer: Self-pay | Admitting: *Deleted

## 2013-09-09 ENCOUNTER — Ambulatory Visit (HOSPITAL_COMMUNITY)
Admission: RE | Admit: 2013-09-09 | Discharge: 2013-09-09 | Disposition: A | Discharge status: Home / Self Care | Payer: Federal, State, Local not specified - PPO | Source: Ambulatory Visit | Attending: Gastroenterology | Admitting: Gastroenterology

## 2013-09-09 ENCOUNTER — Encounter (HOSPITAL_COMMUNITY)
Admission: RE | Disposition: A | Discharge status: Home / Self Care | Payer: Self-pay | Source: Ambulatory Visit | Attending: Gastroenterology

## 2013-09-09 ENCOUNTER — Ambulatory Visit (HOSPITAL_COMMUNITY): Payer: Federal, State, Local not specified - PPO | Admitting: Anesthesiology

## 2013-09-09 DIAGNOSIS — I509 Heart failure, unspecified: Secondary | ICD-10-CM | POA: Insufficient documentation

## 2013-09-09 DIAGNOSIS — I059 Rheumatic mitral valve disease, unspecified: Secondary | ICD-10-CM | POA: Insufficient documentation

## 2013-09-09 DIAGNOSIS — R748 Abnormal levels of other serum enzymes: Secondary | ICD-10-CM | POA: Insufficient documentation

## 2013-09-09 DIAGNOSIS — I2510 Atherosclerotic heart disease of native coronary artery without angina pectoris: Secondary | ICD-10-CM | POA: Insufficient documentation

## 2013-09-09 DIAGNOSIS — R51 Headache: Secondary | ICD-10-CM | POA: Insufficient documentation

## 2013-09-09 DIAGNOSIS — F3289 Other specified depressive episodes: Secondary | ICD-10-CM | POA: Insufficient documentation

## 2013-09-09 DIAGNOSIS — Z87891 Personal history of nicotine dependence: Secondary | ICD-10-CM | POA: Insufficient documentation

## 2013-09-09 DIAGNOSIS — E039 Hypothyroidism, unspecified: Secondary | ICD-10-CM | POA: Insufficient documentation

## 2013-09-09 DIAGNOSIS — K2970 Gastritis, unspecified, without bleeding: Secondary | ICD-10-CM | POA: Insufficient documentation

## 2013-09-09 DIAGNOSIS — R109 Unspecified abdominal pain: Secondary | ICD-10-CM | POA: Insufficient documentation

## 2013-09-09 DIAGNOSIS — F329 Major depressive disorder, single episode, unspecified: Secondary | ICD-10-CM | POA: Insufficient documentation

## 2013-09-09 DIAGNOSIS — K2990 Gastroduodenitis, unspecified, without bleeding: Secondary | ICD-10-CM

## 2013-09-09 HISTORY — DX: Headache: R51

## 2013-09-09 HISTORY — PX: ESOPHAGOGASTRODUODENOSCOPY (EGD) WITH PROPOFOL: SHX5813

## 2013-09-09 HISTORY — PX: EUS: SHX5427

## 2013-09-09 HISTORY — DX: Shortness of breath: R06.02

## 2013-09-09 HISTORY — DX: Bipolar disorder, unspecified: F31.9

## 2013-09-09 HISTORY — DX: Hypothyroidism, unspecified: E03.9

## 2013-09-09 LAB — TRIGLYCERIDES: Triglycerides: 403 mg/dL — ABNORMAL HIGH

## 2013-09-09 LAB — LIPASE, BLOOD: Lipase: 33 U/L (ref 11–59)

## 2013-09-09 SURGERY — ESOPHAGOGASTRODUODENOSCOPY (EGD) WITH PROPOFOL
Anesthesia: Monitor Anesthesia Care

## 2013-09-09 MED ORDER — SODIUM CHLORIDE 0.9 % IV SOLN: INTRAVENOUS | Status: DC

## 2013-09-09 MED ORDER — PROPOFOL INFUSION 10 MG/ML OPTIME
INTRAVENOUS | Status: DC | PRN
  Administered 2013-09-09: 100 ug/kg/min via INTRAVENOUS

## 2013-09-09 MED ORDER — PROPOFOL 10 MG/ML IV BOLUS
INTRAVENOUS | Status: DC | PRN
  Administered 2013-09-09: 50 mg via INTRAVENOUS

## 2013-09-09 MED ORDER — LACTATED RINGERS IV SOLN
INTRAVENOUS | Status: DC | PRN
  Administered 2013-09-09: 12:00:00 via INTRAVENOUS

## 2013-09-09 MED ORDER — LACTATED RINGERS IV SOLN
INTRAVENOUS | Status: DC
  Administered 2013-09-09: 1000 mL via INTRAVENOUS

## 2013-09-09 NOTE — Anesthesia Preprocedure Evaluation (Addendum)
Anesthesia Evaluation  Patient identified by MRN, date of birth, ID band Patient awake    Reviewed: Allergy & Precautions, H&P , NPO status , Patient's Chart, lab work & pertinent test results  History of Anesthesia Complications Negative for: history of anesthetic complications  Airway Mallampati: II TM Distance: >3 FB Neck ROM: Full    Dental  (+) Teeth Intact   Pulmonary neg sleep apnea, neg COPDneg recent URI, former smoker,  breath sounds clear to auscultation        Cardiovascular - angina- CAD and - CHF + Valvular Problems/Murmurs MVP Rhythm:Regular Rate:Normal + Systolic Click Neg stress   Neuro/Psych  Headaches, PSYCHIATRIC DISORDERS Depression    GI/Hepatic negative GI ROS, Neg liver ROS,   Endo/Other  Hypothyroidism   Renal/GU negative Renal ROS     Musculoskeletal   Abdominal   Peds  Hematology negative hematology ROS (+)   Anesthesia Other Findings   Reproductive/Obstetrics                          Anesthesia Physical Anesthesia Plan  ASA: II  Anesthesia Plan: MAC   Post-op Pain Management:    Induction: Intravenous  Airway Management Planned: Natural Airway and Simple Face Mask  Additional Equipment: None  Intra-op Plan:   Post-operative Plan:   Informed Consent: I have reviewed the patients History and Physical, chart, labs and discussed the procedure including the risks, benefits and alternatives for the proposed anesthesia with the patient or authorized representative who has indicated his/her understanding and acceptance.   Dental advisory given  Plan Discussed with: CRNA and Surgeon  Anesthesia Plan Comments:         Anesthesia Quick Evaluation

## 2013-09-09 NOTE — H&P (Signed)
Patient interval history reviewed.  Patient examined again.  There has been no change from documented H/P dated 09/07/13 (scanned into chart from our office) except as documented above.  Assessment:   1.  Acute idiopathic pancreatitis.  Plan:  1.  Endoscopy, and upper endoscopic ultrasound. 2.  Risks (bleeding, infection, bowel perforation that could require surgery, sedation-related changes in cardiopulmonary systems), benefits (identification and possible treatment of source of symptoms, exclusion of certain causes of symptoms), and alternatives (watchful waiting, radiographic imaging studies, empiric medical treatment) of upper endoscopy with ultrasound (EGD + EUS) were explained to patient/family in detail and patient wishes to proceed.

## 2013-09-09 NOTE — Discharge Instructions (Signed)
Endoscopy/Endoscopic ultrasound ° °Care After °Please read the instructions outlined below and refer to this sheet in the next few weeks. These discharge instructions provide you with general information on caring for yourself after you leave the hospital. Your doctor may also give you specific instructions. While your treatment has been planned according to the most current medical practices available, unavoidable complications occasionally occur. If you have any problems or questions after discharge, please call Dr. Alie Hardgrove ([facility]) at [phone]. ° °HOME CARE INSTRUCTIONS °Activity °· You may resume your regular activity but move at a slower pace for the next 24 hours.  °· Take frequent rest periods for the next 24 hours.  °· Walking will help expel (get rid of) the air and reduce the bloated feeling in your abdomen.  °· No driving for 24 hours (because of the anesthesia (medicine) used during the test).  °· You may shower.  °· Do not sign any important legal documents or operate any machinery for 24 hours (because of the anesthesia used during the test).  °Nutrition °· Drink plenty of fluids.  °· You may resume your normal diet.  °· Begin with a light meal and progress to your normal diet.  °· Avoid alcoholic beverages for 24 hours or as instructed by your caregiver.  °Medications °You may resume your normal medications unless your caregiver tells you otherwise. °What you can expect today °· You may experience abdominal discomfort such as a feeling of fullness or "gas" pains.  °· You may experience a sore throat for 2 to 3 days. This is normal. Gargling with salt water may help this.  °·  °SEEK IMMEDIATE MEDICAL CARE IF: °· You have excessive nausea (feeling sick to your stomach) and/or vomiting.  °· You have severe abdominal pain and distention (swelling).  °· You have trouble swallowing.  °· You have a temperature over 100° F (37.8° C).  °· You have rectal bleeding or vomiting of blood.   °Document Released: 01/15/2004 Document Revised: 02/12/2011 Document Reviewed: 07/28/2007 °Exit[employer]® Patient Information ©2012 [employer]. °

## 2013-09-09 NOTE — Anesthesia Postprocedure Evaluation (Signed)
  Anesthesia Post-op Note  Patient: Alyssa Holmes  Procedure(s) Performed: Procedure(s): ESOPHAGOGASTRODUODENOSCOPY (EGD) WITH PROPOFOL (N/A) ESOPHAGEAL ENDOSCOPIC ULTRASOUND (EUS) RADIAL (N/A)  Patient Location: PACU  Anesthesia Type:MAC  Level of Consciousness: awake, alert  and oriented  Airway and Oxygen Therapy: Patient Spontanous Breathing  Post-op Pain: mild  Post-op Assessment: Post-op Vital signs reviewed, Patient's Cardiovascular Status Stable, Respiratory Function Stable, Patent Airway, No signs of Nausea or vomiting and Pain level controlled  Post-op Vital Signs: Reviewed and stable  Complications: No apparent anesthesia complications

## 2013-09-09 NOTE — Op Note (Addendum)
[facility] [place], [address]   ENDOSCOPIC ULTRASOUND PROCEDURE REPORT  PATIENT: Alyssa Holmes, Alyssa Holmes  MR#: [id number] BIRTHDATE: 09-06-1965  GENDER: Female ENDOSCOPIST: Arta Silence, MD REFERRED BY: PROCEDURE DATE:  09/09/2013 PROCEDURE:   Upper EUS ASA CLASS:      Class II INDICATIONS:   1.  abdominal pain, elevated lipase. MEDICATIONS: MAC sedation, administered by CRNA  DESCRIPTION OF PROCEDURE:   After the risks benefits and alternatives of the procedure were  explained, informed consent was obtained. The patient was then placed in the left, lateral, decubitus postion and IV sedation was administered. Throughout the procedure, the patients blood pressure, pulse and oxygen saturations were monitored continuously.  Under direct visualization, the EG-[id number] ([id number]) followed by a forward-viewing radial echoendoscope was introduced through the mouth  and advanced to the second portion of the duodenum .  Water was used as necessary to provide an acoustic interface.  Upon completion of the imaging, water was removed and the patient was sent to the recovery room in satisfactory condition.    FINDINGS:      EGD:  Mild antral erosions and gastritis.  Otherwise normal endoscopy to the second portion of the duodenum. EUS:  Normal-appearing pancreas; no features to suggest chronic pancreatitis; no pancreatic mass; no periopancreatic adenopathy. Post-cholecystectomy.  Bile duct normal; no biliary dilatation, wall thickening, or bile duct stones.  IMPRESSION:     As above.  No explanation for patient's abdominal pain.  Suspect patient might have had pancreatitis, but of unclear etiology; recent LFTs normal, arguing against choledocholithiasis, but it is possible she passed a bile duct stone. Hypertriglyceridemia is another consideration.  RECOMMENDATIONS:     1.  Watch for potential complications of procedure. 2.  Criss Rosales, low fat  diet. 3.  Check lipase again today, and check triglyceride level. 4.  If labs today unrevealing, and patient has another pain flare, consider MRCP to assess for pancreas divisum.  If MRCP is unrevealing, and patient has persistent pain flares, consider tertiary center evaluation for evaluation of Sphincter of Oddi dysfunction.   _______________________________ Lorrin MaisArta Silence, MD 09/09/2013 1:04 PM   CC:

## 2013-09-09 NOTE — Transfer of Care (Signed)
Immediate Anesthesia Transfer of Care Note  Patient: Alyssa Holmes  Procedure(s) Performed: Procedure(s): ESOPHAGOGASTRODUODENOSCOPY (EGD) WITH PROPOFOL (N/A) ESOPHAGEAL ENDOSCOPIC ULTRASOUND (EUS) RADIAL (N/A)  Patient Location: Endoscopy Unit  Anesthesia Type:MAC  Level of Consciousness: awake, alert  and oriented  Airway & Oxygen Therapy: Patient Spontanous Breathing and Patient connected to nasal cannula oxygen  Post-op Assessment: Report given to PACU RN, Post -op Vital signs reviewed and stable and Patient moving all extremities  Post vital signs: Reviewed and stable  Complications: No apparent anesthesia complications

## 2013-09-09 NOTE — Addendum Note (Signed)
Addended by: Arta Silence on: 09/09/2013 09:05 AM   Modules accepted: Orders

## 2013-09-12 ENCOUNTER — Encounter (HOSPITAL_COMMUNITY): Payer: Self-pay | Admitting: Gastroenterology

## 2013-09-28 ENCOUNTER — Inpatient Hospital Stay (HOSPITAL_COMMUNITY)
Admission: AD | Admit: 2013-09-28 | Discharge: 2013-09-28 | Disposition: A | Discharge status: Home / Self Care | Payer: Federal, State, Local not specified - PPO | Source: Ambulatory Visit | Attending: Obstetrics and Gynecology | Admitting: Obstetrics and Gynecology

## 2013-09-28 ENCOUNTER — Inpatient Hospital Stay (HOSPITAL_COMMUNITY): Payer: Federal, State, Local not specified - PPO

## 2013-09-28 ENCOUNTER — Encounter (HOSPITAL_COMMUNITY): Payer: Self-pay | Admitting: *Deleted

## 2013-09-28 DIAGNOSIS — N949 Unspecified condition associated with female genital organs and menstrual cycle: Secondary | ICD-10-CM | POA: Insufficient documentation

## 2013-09-28 DIAGNOSIS — N8111 Cystocele, midline: Secondary | ICD-10-CM | POA: Insufficient documentation

## 2013-09-28 DIAGNOSIS — N83209 Unspecified ovarian cyst, unspecified side: Secondary | ICD-10-CM | POA: Insufficient documentation

## 2013-09-28 HISTORY — DX: Endometriosis, unspecified: N80.9

## 2013-09-28 HISTORY — DX: Benign neoplasm of connective and other soft tissue, unspecified: D21.9

## 2013-09-28 HISTORY — DX: Cystocele, unspecified: N81.10

## 2013-09-28 HISTORY — DX: Acute pancreatitis without necrosis or infection, unspecified: K85.90

## 2013-09-28 HISTORY — DX: Torsion of ovary and ovarian pedicle, unspecified side: N83.519

## 2013-09-28 LAB — URINALYSIS, ROUTINE W REFLEX MICROSCOPIC
Bilirubin Urine: NEGATIVE
GLUCOSE, UA: NEGATIVE mg/dL
Hgb urine dipstick: NEGATIVE
Ketones, ur: NEGATIVE mg/dL
LEUKOCYTES UA: NEGATIVE
NITRITE: NEGATIVE
PH: 5.5 (ref 5.0–8.0)
Protein, ur: NEGATIVE mg/dL
Urobilinogen, UA: 0.2 mg/dL (ref 0.0–1.0)

## 2013-09-28 MED ORDER — KETOROLAC TROMETHAMINE 30 MG/ML IJ SOLN
30.0000 mg | Freq: Once | INTRAMUSCULAR | Status: AC
  Administered 2013-09-28: 30 mg via INTRAMUSCULAR
  Filled 2013-09-28: qty 1

## 2013-09-28 NOTE — MAU Provider Note (Signed)
History   47y.o. Presents unannounced complaining of left sided abdominal and back pain.  Patient states pain presents as throbbing and radiates to left side of her back.  Patient states pain started after difficult bowel movement yesterday and has increased in intensity since.  Patient also reporting burning with urination, but denies vaginal discharge. Patient with recent treatment for UTI and has history of hysterectomy, Rt oophorectomy, endometriosis, fibroids, and recent bladder prolapse.  Patient states that she took toradol at 1pm and pain was 10.  Currently pain 8-9/10.      Patient Active Problem List   Diagnosis Date Noted  . Chest pain, unspecified 09/05/2013  . Elevated cholesterol   . Mitral valve prolapse     Chief Complaint  Patient presents with  . Abdominal Pain   HPI  OB History   Grav Para Term Preterm Abortions TAB SAB Ect Mult Living   2 2 2       2       Past Medical History  Diagnosis Date  . Elevated cholesterol     dx in early 82s  . Mitral valve prolapse     during adolescence  . Bundle branch block   . Shortness of breath   . Hypothyroidism   . Bipolar disorder   . Headache(784.0)   . Fibroid   . Endometriosis   . Pancreatitis   . Ovarian torsion   . Female bladder prolapse     Past Surgical History  Procedure Laterality Date  . Appendectomy    . Cholecystectomy    . Abdominal hysterectomy    . Hernia repair    . Colonoscopy    . Dilation and curettage of uterus    . Esophagogastroduodenoscopy (egd) with propofol N/A 09/09/2013    Procedure: ESOPHAGOGASTRODUODENOSCOPY (EGD) WITH PROPOFOL;  Surgeon: Arta Silence, MD;  Location: [place];  Service: Endoscopy;  Laterality: N/A;  . Eus N/A 09/09/2013    Procedure: ESOPHAGEAL ENDOSCOPIC ULTRASOUND (EUS) RADIAL;  Surgeon: Arta Silence, MD;  Location: [facility] ENDOSCOPY;  Service: Endoscopy;  Laterality: N/A;  . Oophorectomy      Family History  Problem Relation Age of Onset  . Thyroid  disease Mother   . Heart disease Mother   . Diabetes Mother   . Asthma Father   . Stroke Father   . Cancer - Colon Other     History  Substance Use Topics  . Smoking status: Former Smoker    Types: Cigarettes  . Smokeless tobacco: Never Used  . Alcohol Use: Yes     Comment: couple drinks a week    Allergies:  Allergies  Allergen Reactions  . Vancomycin Hives  . Penicillins Hives    Prescriptions prior to admission  Medication Sig Dispense Refill  . aspirin 81 MG tablet Take 81 mg by mouth daily.      Marland Kitchen BIOTIN PO Take 1 tablet by mouth daily.      Marland Kitchen CINNAMON PO Take 1 tablet by mouth daily.      Marland Kitchen CLOBETASOL PROPIONATE E 0.05 % emollient cream Apply 1 application topically 2 (two) times daily.      Marland Kitchen ketorolac (TORADOL) 10 MG tablet Take 10 mg by mouth every 6 (six) hours.      Marland Kitchen lamoTRIgine (LAMICTAL) 200 MG tablet Take 200 mg by mouth daily.      Marland Kitchen levothyroxine (SYNTHROID, LEVOTHROID) 88 MCG tablet Take 88 mcg by mouth daily before breakfast.      . MAGNESIUM PO Take  1 tablet by mouth daily.      . Misc Natural Products (APPLE CIDER VINEGAR) TABS Take 1 tablet by mouth daily.      . Multiple Vitamins-Minerals (MULTIVITAMIN WITH MINERALS) tablet Take 1 tablet by mouth daily.      Marland Kitchen NIACIN PO Take 1 tablet by mouth daily.      . nitrofurantoin (MACRODANTIN) 100 MG capsule Take 100 mg by mouth 4 (four) times daily.      Marland Kitchen venlafaxine XR (EFFEXOR-XR) 75 MG 24 hr capsule Take 75 mg by mouth daily with breakfast.      . nitroGLYCERIN (NITROSTAT) 0.4 MG SL tablet Place 1 tablet (0.4 mg total) under the tongue every 5 (five) minutes x 3 doses as needed for chest pain.  25 tablet  3    ROS See HPI Above Physical Exam   Blood pressure 137/73, pulse 90, temperature 99.1 F (37.3 C), temperature source Oral, resp. rate 18, height 5\' 5"  (1.651 m), weight 168 lb (76.204 kg).  Physical Exam Abdomen: Soft, tender at left side near ovary BS x 4Q Back: No CVAT Pelvic Exam:deferred  per patient request ED Course  48 y.o. Female Left Abdominal Pain Back Pain  UA-WNL Toradol IM Pelvic US-EXAM: TRANSABDOMINAL AND TRANSVAGINAL ULTRASOUND OF PELVIS  Both transabdominal and transvaginal ultrasound examinations of the  pelvis were performed. Transabdominal technique was performed for  global imaging of the pelvis including uterus, ovaries, adnexal  regions, and pelvic cul-de-sac. It was necessary to proceed with  endovaginal exam following the transabdominal exam to visualize the  left ovary.   Uterus reportedly surgically absent.  Right ovary Not visualized, reportedly surgically absent.  Left ovary Measurements: 5.5 x 2.7 x 3.3 cm. 3.0 x 2.5 x 2.7 cm hemorrhagic cyst.  Dr. Chauncey Cruel. [place] consulted and results discussed Continue to take toradol as prescribed Patient needs to have ovary removed--please call office for follow up care Call if you have any questions or concerns prior to your next visit.    Gavin Pound CNM, MSN 09/28/2013 4:57 PM

## 2013-09-28 NOTE — MAU Note (Signed)
2 cysts (5cm and 2 cm)on left ovary, was told if pain worse to get checked out.  Bladder prolapse and treated for UTI 2 wks ago, seems like it is coming back.

## 2013-09-29 ENCOUNTER — Encounter: Payer: Federal, State, Local not specified - PPO | Admitting: Nurse Practitioner

## 2013-10-10 ENCOUNTER — Encounter: Payer: Self-pay | Admitting: Physician Assistant

## 2013-10-10 ENCOUNTER — Ambulatory Visit (INDEPENDENT_AMBULATORY_CARE_PROVIDER_SITE_OTHER): Payer: Federal, State, Local not specified - PPO | Admitting: Physician Assistant

## 2013-10-10 VITALS — BP 120/68 | HR 92 | Ht 65.0 in | Wt 168.0 lb

## 2013-10-10 DIAGNOSIS — I341 Nonrheumatic mitral (valve) prolapse: Secondary | ICD-10-CM

## 2013-10-10 DIAGNOSIS — I059 Rheumatic mitral valve disease, unspecified: Secondary | ICD-10-CM

## 2013-10-10 DIAGNOSIS — E7800 Pure hypercholesterolemia, unspecified: Secondary | ICD-10-CM

## 2013-10-10 DIAGNOSIS — R079 Chest pain, unspecified: Secondary | ICD-10-CM

## 2013-10-10 NOTE — Assessment & Plan Note (Signed)
Chest pain felt secondary to pancreatitis much better and enzyme therapy. She had a negative stress test while in the hospital. Followup when necessary.

## 2013-10-10 NOTE — Patient Instructions (Signed)
Your physician recommends that you schedule a follow-up appointment in: AS NEEDED  Your physician recommends that you continue on your current medications as directed. Please refer to the Current Medication list given to you today.  

## 2013-10-10 NOTE — Progress Notes (Signed)
HPI:   This is a 48 year old female patient who is admitted to the hospital with chest pain. Her troponins were negative and EKG was without acute change she underwent stress testing that showed no ischemia or EKG changes she exercised 8 minutes without chest pain. 2-D echo in 2013 showed normal LV function and adequate valvular function she had borderline mitral valve prolapse. This was not repeated. Since discharge she saw a gastroenterologist who did an endoscopy which was normal but was told that she had pancreatitis and was started on enzymes. She feels so much better. She also was found to have 2 ovarian cyst that will require surgery.  Patient denies any further chest pain, palpitations, dyspnea, dizziness, or presyncope.   Allergies-- Vancomycin -- Hives  -- Penicillins -- Hives  Current Outpatient Prescriptions on File Prior to Visit: aspirin 81 MG tablet, Take 81 mg by mouth daily., Disp: , Rfl:  BIOTIN PO, Take 1 tablet by mouth daily., Disp: , Rfl:  CINNAMON PO, Take 1 tablet by mouth daily., Disp: , Rfl:  ketorolac (TORADOL) 10 MG tablet, Take 10 mg by mouth every 6 (six) hours., Disp: , Rfl:  lamoTRIgine (LAMICTAL) 200 MG tablet, Take 200 mg by mouth daily., Disp: , Rfl:  levothyroxine (SYNTHROID, LEVOTHROID) 88 MCG tablet, Take 88 mcg by mouth daily before breakfast., Disp: , Rfl:  MAGNESIUM PO, Take 1 tablet by mouth daily., Disp: , Rfl:  Misc Natural Products (APPLE CIDER VINEGAR) TABS, Take 1 tablet by mouth daily., Disp: , Rfl:  Multiple Vitamins-Minerals (MULTIVITAMIN WITH MINERALS) tablet, Take 1 tablet by mouth daily., Disp: , Rfl:  NIACIN PO, Take 1 tablet by mouth daily., Disp: , Rfl:  nitroGLYCERIN (NITROSTAT) 0.4 MG SL tablet, Place 1 tablet (0.4 mg total) under the tongue every 5 (five) minutes x 3 doses as needed for chest pain., Disp: 25 tablet, Rfl: 3 venlafaxine XR (EFFEXOR-XR) 75 MG 24 hr capsule, Take 75 mg by mouth daily with breakfast., Disp: , Rfl:   No  current facility-administered medications on file prior to visit.   Past Medical History:   Elevated cholesterol                                           Comment:dx in early 74s   Mitral valve prolapse                                          Comment:during adolescence   Bundle branch block                                          Shortness of breath                                          Hypothyroidism                                               Bipolar disorder  Headache(784.0)                                              Fibroid                                                      Endometriosis                                                Pancreatitis                                                 Ovarian torsion                                              Female bladder prolapse                                     Past Surgical History:   APPENDECTOMY                                                  CHOLECYSTECTOMY                                               ABDOMINAL HYSTERECTOMY                                        HERNIA REPAIR                                                 COLONOSCOPY                                                   DILATION AND CURETTAGE OF UTERUS                              ESOPHAGOGASTRODUODENOSCOPY (EGD) WITH PROPOFOL  N/A 09/09/2013      Comment:Procedure: ESOPHAGOGASTRODUODENOSCOPY (EGD)               WITH PROPOFOL;  Surgeon: Arta Silence, MD;  Location: [facility] ENDOSCOPY;  Service: Endoscopy;                Laterality: N/A;   EUS                                             N/A 09/09/2013      Comment:Procedure: ESOPHAGEAL ENDOSCOPIC ULTRASOUND               (EUS) RADIAL;  Surgeon: Arta Silence, MD;                Location: [facility];  Service: Endoscopy;                Laterality: N/A;   OOPHORECTOMY                                                 Review of patient's family  history indicates:   Thyroid disease                Mother                   Heart disease                  Mother                   Diabetes                       Mother                   Asthma                         Father                   Stroke                         Father                   Cancer - Colon                 Other                    Social History   Marital Status: Married             Spouse Name:                      Years of Education:                 Number of children:             Occupational History   None on file  Social History Main Topics   Smoking Status: Former Smoker                   Packs/Day: 0.00  Years:           Types: Cigarettes   Smokeless Status: Never Used                       Alcohol  Use: Yes               Comment: couple drinks a week   Drug Use: No             Sexual Activity: Yes                    Birth Control/Protection: Post-menopausal  Other Topics            Concern   None on file  Social History Narrative   None on file    ROS: See history of present illness otherwise negative   PHYSICAL EXAM: Well-nournished, in no acute distress. Neck: No JVD, HJR, Bruit, or thyroid enlargement  Lungs: No tachypnea, clear without wheezing, rales, or rhonchi  Cardiovascular: RRR, PMI not displaced, heart sounds normal, no murmurs, gallops, bruit, thrill, or heave.  Abdomen: BS normal. Soft without organomegaly, masses, lesions or tenderness.  Extremities: without cyanosis, clubbing or edema. Good distal pulses bilateral  SKin: Warm, no lesions or rashes   Musculoskeletal: No deformities  Neuro: no focal signs  BP 120/68  Pulse 92  Ht 5\' 5"  (1.651 m)  Wt 168 lb (76.204 kg)  BMI 27.96 kg/m2  SpO2 99%

## 2013-10-10 NOTE — Assessment & Plan Note (Signed)
She is on niacin. This will be monitored by her primary M.D.

## 2013-10-10 NOTE — Assessment & Plan Note (Signed)
Stable

## 2013-10-26 ENCOUNTER — Other Ambulatory Visit: Payer: Self-pay | Admitting: Obstetrics and Gynecology

## 2013-10-31 ENCOUNTER — Other Ambulatory Visit (HOSPITAL_COMMUNITY): Payer: Self-pay | Admitting: Obstetrics and Gynecology

## 2013-10-31 NOTE — H&P (Signed)
Alyssa Holmes is a 48 y.o. female P 2-0-0-2 who presents   for a  left salpingo- oophorectomy because of persistent pelvic pain and multiple left ovarian cysts.  This patient has a history of endometriosis, polycystic ovarian syndrome and is S/P right  oophorectomy because of  ovarian torsion.  Over the past several years she's experienced intermittent pelvic pain,  but over the past 3 months has had persistent pelvic pain- right greater than left particularly with defecation and intercourse. She reports the pain as being intense and partially relieved with defecation or cessation of intercourse. She's not had any vaginitis symptoms, urinary tract symptoms or diarrhea.  She does have occasional constipation that is relieved with Magnesium supplement.  A pelvic ultrasound in March of this year showed: uterus and left ovary surgically absent but a left ovary-5.39 x 5.33 x 3.84 cm with #2 cysts: superiorly a simple cyst-2.96 x 2.79 x 2.66 cm and a second cyst, inferiorly with cystic and solid encomponents suggestive of an endometrioma-4.12 x 3.48 x 4.46 cm.  The patient was offered GYN-Oncology consult however, she declined.  She was placed on oral Toradol for pain relief  but continued to experience discomfort.  A follow up ultrasound in May 2015 showed that both left ovarian cysts had somewhat decreased in size: superior cyst-2.07 x 2.18 x 2.15 cm and the inferior cyst, now had only a solid component-2.17 x 2.23 x 2.39 cm.  A review of both medical and surgical management options were given to the patient,  but due to the protracted nature of her symptoms, past history and findings on ultrasound she wants to proceed with removal of her left ovary.   Past Medical History  OB History: G: 2: P 2-0-0-2;  SVB 1996 and 2002  GYN History: menarche: 48 YO;    PYP:[id number]    Contracepton; History of Abnormal PAP (remote) but no history of STD; Last PAP smear: March 2015  Medical History:  Hypercholesterolemia, Mitral Valve Prolapse, Polycystic Ovarian Syndrome, Thyroid Disease, Gallbladder Disease, Hemorrhoids, Bipolar Disorder, Endometriosis and Uterine Fibroids  Surgical History: 1979  Removal of Pilonidal Cyst;  1979  Appendectomy;   1993  Laparoscopic Ovarian Cystectomy; 2009 Cholecystectomy 2001 Right Salpingo-oophorectomy due to Torsion;  2012 Hysterectomy due to Endometriosis and  Uterine Fibroids Denies problems with anesthesia or history of blood transfusions  Family History: Thyroid Disease, Cardiac Vascular Disease, Elevated Cholesterol, Stroke, Diabetes Mellitus and Skin Cancer  Social History: 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 Vancomycin Hives  . Penicillins Hives     Denies sensitivity to peanuts, shellfish, soy, latex or adhesives.  ROS: Admits to glasses, constipation, hemorroids and occasional problems stopping urine flow;   Denies headache, vision changes, nasal congestion, dysphagia, tinnitus, dizziness, hoarseness, cough,  chest pain, shortness of breath, nausea, vomiting, diarrhea,constipation,  urinary frequency, urgency  dysuria, hematuria, vaginitis symptoms, swelling of joints,easy bruising,  myalgias, arthralgias, skin rashes, unexplained weight loss and except as is mentioned in the history of present illness, patient's review of systems is otherwise negative.  Physical Exam  Bp: 104/60  P: 70   R: 16  Temperature: 98.8 degrees F orally Weight: 170 lbs.  Height: 5\' 5"    BMI: 28.3  Neck: supple without masses or thyromegaly Lungs: clear to auscultation  Heart: regular rate and rhythm Abdomen: soft, left lower quadrant tenderness with guarding and no organomegaly Pelvic:EGBUS- wnl; vagina-normal rugae;  uterus/cervix-surgically absent;  adnexae-right-non-tender but left tenderness with no palpable masses Extremities:  no clubbing, cyanosis or edema   Assesment: Pelvic Pain           Complex Left  Ovarian Cyst   Disposition: Reviewed the risks of surgery to include, but not limited to: reaction to anesthesia, damage to adjacent organs, infection and excessive bleeding. The patient verbalized understanding of these risks and has consented to proceed with Laparoscopic Left Salpingo-oophorectomy on November 15, 2013 at 1:30 p.m. at [place].  CSN# [id number]   Tyrone Pautsch J. Florene Glen, PA-C  for Dr. Franklyn Lor. Dillard

## 2013-11-08 ENCOUNTER — Encounter (HOSPITAL_COMMUNITY): Payer: Self-pay

## 2013-11-09 ENCOUNTER — Other Ambulatory Visit: Payer: Self-pay | Admitting: Internal Medicine

## 2013-11-09 ENCOUNTER — Ambulatory Visit
Admission: RE | Admit: 2013-11-09 | Discharge: 2013-11-09 | Disposition: A | Discharge status: Home / Self Care | Payer: Federal, State, Local not specified - PPO | Source: Ambulatory Visit | Attending: Internal Medicine | Admitting: Internal Medicine

## 2013-11-09 DIAGNOSIS — E041 Nontoxic single thyroid nodule: Secondary | ICD-10-CM

## 2013-11-15 ENCOUNTER — Ambulatory Visit (HOSPITAL_COMMUNITY): Payer: Federal, State, Local not specified - PPO | Admitting: Anesthesiology

## 2013-11-15 ENCOUNTER — Encounter (HOSPITAL_COMMUNITY)
Admission: RE | Disposition: A | Discharge status: Home / Self Care | Payer: Self-pay | Source: Ambulatory Visit | Attending: Obstetrics and Gynecology

## 2013-11-15 ENCOUNTER — Encounter (HOSPITAL_COMMUNITY): Payer: Self-pay | Admitting: Anesthesiology

## 2013-11-15 ENCOUNTER — Ambulatory Visit (HOSPITAL_COMMUNITY)
Admission: RE | Admit: 2013-11-15 | Discharge: 2013-11-15 | Disposition: A | Discharge status: Home / Self Care | Payer: Federal, State, Local not specified - PPO | Source: Ambulatory Visit | Attending: Obstetrics and Gynecology | Admitting: Obstetrics and Gynecology

## 2013-11-15 ENCOUNTER — Encounter (HOSPITAL_COMMUNITY): Payer: Federal, State, Local not specified - PPO | Admitting: Anesthesiology

## 2013-11-15 DIAGNOSIS — Z87891 Personal history of nicotine dependence: Secondary | ICD-10-CM | POA: Insufficient documentation

## 2013-11-15 DIAGNOSIS — E282 Polycystic ovarian syndrome: Secondary | ICD-10-CM | POA: Insufficient documentation

## 2013-11-15 DIAGNOSIS — E7800 Pure hypercholesterolemia, unspecified: Secondary | ICD-10-CM | POA: Insufficient documentation

## 2013-11-15 DIAGNOSIS — N83209 Unspecified ovarian cyst, unspecified side: Secondary | ICD-10-CM | POA: Insufficient documentation

## 2013-11-15 DIAGNOSIS — M25559 Pain in unspecified hip: Secondary | ICD-10-CM

## 2013-11-15 DIAGNOSIS — I059 Rheumatic mitral valve disease, unspecified: Secondary | ICD-10-CM | POA: Insufficient documentation

## 2013-11-15 DIAGNOSIS — Z8742 Personal history of other diseases of the female genital tract: Secondary | ICD-10-CM | POA: Insufficient documentation

## 2013-11-15 DIAGNOSIS — N949 Unspecified condition associated with female genital organs and menstrual cycle: Secondary | ICD-10-CM | POA: Insufficient documentation

## 2013-11-15 HISTORY — PX: LAPAROSCOPY: SHX197

## 2013-11-15 LAB — CBC
HEMATOCRIT: 41.8 % (ref 36.0–46.0)
HEMOGLOBIN: 14.1 g/dL (ref 12.0–15.0)
MCH: 28 pg (ref 26.0–34.0)
MCHC: 33.7 g/dL (ref 30.0–36.0)
MCV: 82.9 fL (ref 78.0–100.0)
Platelets: 250 10*3/uL (ref 150–400)
RBC: 5.04 MIL/uL (ref 3.87–5.11)
RDW: 13.8 % (ref 11.5–15.5)
WBC: 8.2 10*3/uL (ref 4.0–10.5)

## 2013-11-15 LAB — PREGNANCY, URINE: Preg Test, Ur: NEGATIVE

## 2013-11-15 SURGERY — LAPAROSCOPY OPERATIVE
Anesthesia: General | Site: Abdomen | Laterality: Left

## 2013-11-15 MED ORDER — ONDANSETRON HCL 4 MG/2ML IJ SOLN
INTRAMUSCULAR | Status: DC | PRN
  Administered 2013-11-15: 4 mg via INTRAVENOUS

## 2013-11-15 MED ORDER — NEOSTIGMINE METHYLSULFATE 10 MG/10ML IV SOLN
INTRAVENOUS | Status: DC | PRN
  Administered 2013-11-15: 1 mg via INTRAVENOUS

## 2013-11-15 MED ORDER — KETOROLAC TROMETHAMINE 30 MG/ML IJ SOLN
INTRAMUSCULAR | Status: DC | PRN
Start: 2013-11-15 — End: 2013-11-15
  Administered 2013-11-15: 30 mg via INTRAVENOUS

## 2013-11-15 MED ORDER — PROPOFOL 10 MG/ML IV EMUL
INTRAVENOUS | Status: AC
  Filled 2013-11-15: qty 20

## 2013-11-15 MED ORDER — OXYCODONE-ACETAMINOPHEN 5-325 MG PO TABS: 1.0000 | ORAL_TABLET | Freq: Four times a day (QID) | ORAL | Status: DC | PRN

## 2013-11-15 MED ORDER — LACTATED RINGERS IV SOLN
INTRAVENOUS | Status: DC
  Administered 2013-11-15 (×3): via INTRAVENOUS

## 2013-11-15 MED ORDER — FENTANYL CITRATE 0.05 MG/ML IJ SOLN
INTRAMUSCULAR | Status: AC
  Filled 2013-11-15: qty 2

## 2013-11-15 MED ORDER — ROCURONIUM BROMIDE 100 MG/10ML IV SOLN
INTRAVENOUS | Status: DC | PRN
  Administered 2013-11-15: 35 mg via INTRAVENOUS
  Administered 2013-11-15: 15 mg via INTRAVENOUS

## 2013-11-15 MED ORDER — DEXAMETHASONE SODIUM PHOSPHATE 10 MG/ML IJ SOLN
INTRAMUSCULAR | Status: DC | PRN
  Administered 2013-11-15: 5 mg via INTRAVENOUS

## 2013-11-15 MED ORDER — BUPIVACAINE HCL (PF) 0.25 % IJ SOLN
INTRAMUSCULAR | Status: AC
  Filled 2013-11-15: qty 30

## 2013-11-15 MED ORDER — FENTANYL CITRATE 0.05 MG/ML IJ SOLN: 25.0000 ug | INTRAMUSCULAR | Status: DC | PRN

## 2013-11-15 MED ORDER — FENTANYL CITRATE 0.05 MG/ML IJ SOLN
INTRAMUSCULAR | Status: DC | PRN
  Administered 2013-11-15 (×5): 50 ug via INTRAVENOUS

## 2013-11-15 MED ORDER — MEPERIDINE HCL 25 MG/ML IJ SOLN
6.2500 mg | INTRAMUSCULAR | Status: DC | PRN
Start: 2013-11-15 — End: 2013-11-15

## 2013-11-15 MED ORDER — LIDOCAINE HCL (CARDIAC) 20 MG/ML IV SOLN
INTRAVENOUS | Status: AC
  Filled 2013-11-15: qty 5

## 2013-11-15 MED ORDER — MIDAZOLAM HCL 2 MG/2ML IJ SOLN
INTRAMUSCULAR | Status: DC | PRN
  Administered 2013-11-15: 1 mg via INTRAVENOUS

## 2013-11-15 MED ORDER — ONDANSETRON HCL 4 MG/2ML IJ SOLN: 4.0000 mg | Freq: Once | INTRAMUSCULAR | Status: DC | PRN

## 2013-11-15 MED ORDER — FENTANYL CITRATE 0.05 MG/ML IJ SOLN
INTRAMUSCULAR | Status: AC
Start: 2013-11-15 — End: 2013-11-15
  Filled 2013-11-15: qty 2

## 2013-11-15 MED ORDER — NEOSTIGMINE METHYLSULFATE 10 MG/10ML IV SOLN
INTRAVENOUS | Status: AC
  Filled 2013-11-15: qty 1

## 2013-11-15 MED ORDER — ROCURONIUM BROMIDE 100 MG/10ML IV SOLN
INTRAVENOUS | Status: AC
  Filled 2013-11-15: qty 1

## 2013-11-15 MED ORDER — MIDAZOLAM HCL 2 MG/2ML IJ SOLN
INTRAMUSCULAR | Status: AC
  Filled 2013-11-15: qty 2

## 2013-11-15 MED ORDER — KETOROLAC TROMETHAMINE 30 MG/ML IJ SOLN
INTRAMUSCULAR | Status: AC
  Filled 2013-11-15: qty 1

## 2013-11-15 MED ORDER — BUPIVACAINE-EPINEPHRINE 0.25% -1:200000 IJ SOLN
INTRAMUSCULAR | Status: DC | PRN
  Administered 2013-11-15: 7 mg

## 2013-11-15 MED ORDER — IBUPROFEN 600 MG PO TABS: ORAL_TABLET | ORAL | Status: DC

## 2013-11-15 MED ORDER — BUPIVACAINE-EPINEPHRINE (PF) 0.25% -1:200000 IJ SOLN
INTRAMUSCULAR | Status: AC
  Filled 2013-11-15: qty 30

## 2013-11-15 MED ORDER — LACTATED RINGERS IR SOLN
Status: DC | PRN
  Administered 2013-11-15: 3000 mL

## 2013-11-15 MED ORDER — KETOROLAC TROMETHAMINE 30 MG/ML IJ SOLN: 15.0000 mg | Freq: Once | INTRAMUSCULAR | Status: DC | PRN

## 2013-11-15 MED ORDER — HEPARIN SODIUM (PORCINE) 5000 UNIT/ML IJ SOLN
INTRAMUSCULAR | Status: AC
  Filled 2013-11-15: qty 1

## 2013-11-15 MED ORDER — LIDOCAINE HCL (CARDIAC) 20 MG/ML IV SOLN
INTRAVENOUS | Status: DC | PRN
  Administered 2013-11-15: 30 mg via INTRAVENOUS
  Administered 2013-11-15: 70 mg via INTRAVENOUS

## 2013-11-15 MED ORDER — ONDANSETRON HCL 4 MG/2ML IJ SOLN
INTRAMUSCULAR | Status: AC
  Filled 2013-11-15: qty 2

## 2013-11-15 MED ORDER — GLYCOPYRROLATE 0.2 MG/ML IJ SOLN
INTRAMUSCULAR | Status: AC
  Filled 2013-11-15: qty 3

## 2013-11-15 MED ORDER — DEXAMETHASONE SODIUM PHOSPHATE 10 MG/ML IJ SOLN
INTRAMUSCULAR | Status: AC
  Filled 2013-11-15: qty 1

## 2013-11-15 MED ORDER — METHYLENE BLUE 1 % INJ SOLN
INTRAMUSCULAR | Status: AC
  Filled 2013-11-15: qty 10

## 2013-11-15 MED ORDER — PROPOFOL 10 MG/ML IV BOLUS
INTRAVENOUS | Status: DC | PRN
  Administered 2013-11-15: 170 mg via INTRAVENOUS

## 2013-11-15 MED ORDER — GLYCOPYRROLATE 0.2 MG/ML IJ SOLN
INTRAMUSCULAR | Status: DC | PRN
  Administered 2013-11-15: 0.2 mg via INTRAVENOUS

## 2013-11-15 SURGICAL SUPPLY — 34 items
BARRIER ADHS 3X4 INTERCEED (GAUZE/BANDAGES/DRESSINGS) IMPLANT
CABLE HIGH FREQUENCY MONO STRZ (ELECTRODE) ×2 IMPLANT
CHLORAPREP W/TINT 26ML (MISCELLANEOUS) ×2 IMPLANT
CLOTH BEACON ORANGE TIMEOUT ST (SAFETY) ×2 IMPLANT
DERMABOND ADVANCED (GAUZE/BANDAGES/DRESSINGS) ×1
DERMABOND ADVANCED .7 DNX12 (GAUZE/BANDAGES/DRESSINGS) ×1 IMPLANT
DRESSING OPSITE X SMALL 2X3 (GAUZE/BANDAGES/DRESSINGS) ×2 IMPLANT
DRSG COVADERM PLUS 2X2 (GAUZE/BANDAGES/DRESSINGS) ×2 IMPLANT
DRSG OPSITE POSTOP 3X4 (GAUZE/BANDAGES/DRESSINGS) IMPLANT
DRSG VASELINE 3X18 (GAUZE/BANDAGES/DRESSINGS) IMPLANT
EVACUATOR SMOKE 8.L (FILTER) ×2 IMPLANT
FORCEPS CUTTING 33CM 5MM (CUTTING FORCEPS) IMPLANT
FORCEPS CUTTING 45CM 5MM (CUTTING FORCEPS) IMPLANT
GLOVE BIO SURGEON STRL SZ 6.5 (GLOVE) ×2 IMPLANT
GLOVE BIOGEL PI IND STRL 7.0 (GLOVE) ×2 IMPLANT
GLOVE BIOGEL PI INDICATOR 7.0 (GLOVE) ×2
GOWN STRL REUS W/TWL LRG LVL3 (GOWN DISPOSABLE) ×4 IMPLANT
MANIPULATOR UTERINE 4.5 ZUMI (MISCELLANEOUS) IMPLANT
NS IRRIG 1000ML POUR BTL (IV SOLUTION) ×2 IMPLANT
PACK LAPAROSCOPY BASIN (CUSTOM PROCEDURE TRAY) ×2 IMPLANT
POUCH SPECIMEN RETRIEVAL 10MM (ENDOMECHANICALS) ×2 IMPLANT
PROTECTOR NERVE ULNAR (MISCELLANEOUS) ×2 IMPLANT
SET IRRIG TUBING LAPAROSCOPIC (IRRIGATION / IRRIGATOR) ×2 IMPLANT
SHEARS HARMONIC ACE PLUS 36CM (ENDOMECHANICALS) IMPLANT
SOLUTION ELECTROLUBE (MISCELLANEOUS) IMPLANT
SUT MNCRL AB 3-0 PS2 27 (SUTURE) ×2 IMPLANT
SUT VICRYL 0 ENDOLOOP (SUTURE) ×4 IMPLANT
SUT VICRYL 0 UR6 27IN ABS (SUTURE) ×4 IMPLANT
TOWEL OR 17X24 6PK STRL BLUE (TOWEL DISPOSABLE) ×4 IMPLANT
TRAY FOLEY CATH 14FR (SET/KITS/TRAYS/PACK) ×2 IMPLANT
TROCAR BALLN 12MMX100 BLUNT (TROCAR) ×2 IMPLANT
TROCAR XCEL NON-BLD 5MMX100MML (ENDOMECHANICALS) ×4 IMPLANT
WARMER LAPAROSCOPE (MISCELLANEOUS) ×2 IMPLANT
WATER STERILE IRR 1000ML POUR (IV SOLUTION) ×2 IMPLANT

## 2013-11-15 NOTE — Op Note (Signed)
Diagnosis: Pain in left ovarian cyst Post op Diagnosis:  Left ovarian cyst and its multiple abdominal pelvic adhesions Procedure: Laparoscopy, lysis of adhesions, left ovarian salpingo-oophorectomy: Removal of left ovarian cyst The blood loss: Is minimal Complications: None Surgeon: Dr. Charlesetta Garibaldi Assistant Florene Glen PA Aesthesias general endotracheal tube Change in detail The patient was taken to the operating room. In dorsal lithotomy position. Prepped and draped in the normal sterile fashion. Is on a stick was placed into the vagina to be used as a reference point. Percent Marcaine was used and placed around the umbilicus. A knife was used to go over previous scar infraumbilically. Fascia was then grasped with cokers. This was incised with a knife. And entered using a hemostat.  Fascia incision was extended using Mayo scissors.  0 Vicryl was placed around the fascia circumferentially.  A Hassan trocarwas placed into the abdomen anchored by filling the balloon.  Abdomen was insufflated with CO2 gas.  There were dense omental abdominal wall adhesions seen.  A 5 mm was placed in the right lower quadrant as this was clear from adhesions.  See the operative scope adhesions were lysed with scissors using countertraction from a a grasper was placed in the right lower quadrant.  The omentum was irrigated and there was no bowel seen in the omentum.  A millimeters trochars and placed in the left lower quadrant.  There were adhesions going from the colon to the infundibulopelvic ligament and ovary. His were sharply and bluntly lysed. Stasis was assured and no damage was seen to the to the pelvic ligament or the colon.  2 Endoloops were placed on the infundibulopelvic ligament.  The ureter was seen before the loops were placed and it  Was far away from the the ligation.  The ovary was then removed and placed in the cul-de-sac. A small cyst, just to the ovary was removed and sent to pathology.  Some fimbria it was seen  that was cut with scissors and sent to pathology.  A 5 scope was then placed through the right lower quadrant port and is in the Endo Catch bag the ovary was placed into the Endo Catch through the 10 port and removed without difficulty.  Also had some adhesions from her bowel down to the cul-de-sacs which were sharply lysed with scissors. Hemostasis was assured.  Patient was done to the intra-abdominal wall there were some bleeders which was made hemostatic with Kleppingers.  Ovarian pedicle was hemostatic.  The removed from the abdomen.  Was removed and the abdomen  stick was removed the vagina.  Fascia at the 10 mm port was reapproximated by tying the suture.  Skin a 10 mm port was approximated using 3-0 Monocryl subarticular fashion.  Her mom is placed on all skin incisions.  All counts were correct patient her room in stable condition this is Dr. Charlesetta Garibaldi dictating thank you very much

## 2013-11-15 NOTE — Discharge Instructions (Signed)
Call [facility] @ [phone] if:  You have a temperature greater than or equal to 100.4 degrees Farenheit orally You have pain that is not made better by the pain medication given and taken as directed You have excessive bleeding or problems urinating  Take Colace (Docusate Sodium/Stool Softener) 100 mg 2-3 times daily while taking narcotic pain medicine to avoid constipation or until bowel movements are regular. Do not take your daily Aspirin while taking the Ibuprofen,  you may resume after you have finished the Ibuprofen 600 mg with food every 6 hours x 3 days   You may shower tomorrow You may resume a regular diet Keep incisions clean and dry Do not lift over 15 pounds for 6 weeks Avoid anything in vagina  until after your post-operative visit  Keep follow up appointment with Dr.Dillard on December 01, 2013 at 9:30 a.m. Diagnostic Laparoscopy Laparoscopy is a surgical procedure. It is used to diagnose and treat diseases inside the belly (abdomen). It is usually a brief, common, and relatively simple procedure. The laparoscopeis a thin, lighted, pencil-sized instrument. It is like a telescope. It is inserted into your abdomen through a small cut (incision). Your caregiver can look at the organs inside your body through this instrument. He or she can see if there is anything abnormal. Laparoscopy can be done either in a hospital or outpatient clinic. You may be given a mild sedative to help you relax before the procedure. Once in the operating room, you will be given a drug to make you sleep (general anesthesia). Laparoscopy usually lasts less than 1 hour. After the procedure, you will be monitored in a recovery area until you are stable and doing well. Once you are home, it will take 2 to 3 days to fully recover. RISKS AND COMPLICATIONS  Laparoscopy has relatively few risks. Your caregiver will discuss the risks with you before the procedure. Some problems that can occur  include:  Infection.  Bleeding.  Damage to other organs.  Anesthetic side effects. PROCEDURE Once you receive anesthesia, your surgeon inflates the abdomen with a harmless gas (carbon dioxide). This makes the organs easier to see. The laparoscope is inserted into the abdomen through a small incision. This allows your surgeon to see into the abdomen. Other small instruments are also inserted into the abdomen through other small openings. Many surgeons attach a video camera to the laparoscope to enlarge the view. During a diagnostic laparoscopy, the surgeon may be looking for inflammation, infection, or cancer. Your surgeon may take tissue samples(biopsies). The samples are sent to a specialist in looking at cells and tissue samples (pathologist). The pathologist examines them under a microscope. Biopsies can help to diagnose or confirm a disease. AFTER THE PROCEDURE   The gas is released from inside the abdomen.  The incisions are closed with stitches (sutures). Because these incisions are small (usually less than 1/2 inch), there is usually minimal discomfort after the procedure. There may be some mild discomfort in the throat. This is from the tube placed in the throat while you were sleeping. You may have some mild abdominal discomfort. There may also be discomfort from the instrument placement incisions in the abdomen.  The recovery time is shortened as long as there are no complications.  You will rest in a recovery room until stable and doing well. As long as there are no complications, you may be allowed to go home. FINDING OUT THE RESULTS OF YOUR TEST Not all test results are available  during your visit. If your test results are not back during the visit, make an appointment with your caregiver to find out the results. Do not assume everything is normal if you have not heard from your caregiver or the medical facility. It is important for you to follow up on all of your test  results. HOME CARE INSTRUCTIONS   Take all medicines as directed.  Only take over-the-counter or prescription medicines for pain, discomfort, or fever as directed by your caregiver.  Resume daily activities as directed.  Showers are preferred over baths.  You may resume sexual activities in 1 week or as directed.  Do not drive while taking narcotics. SEEK MEDICAL CARE IF:   There is increasing abdominal pain.  There is new pain in the shoulders (shoulder strap areas).  You feel lightheaded or faint.  You have the chills.  You or your child has an oral temperature above 102 F (38.9 C).  There is pus-like (purulent) drainage from any of the wounds.  You are unable to pass gas or have a bowel movement.  You feel sick to your stomach (nauseous) or throw up (vomit). MAKE SURE YOU:   Understand these instructions.  Will watch your condition.  Will get help right away if you are not doing well or get worse. Document Released: 09/08/2000 Document Revised: 09/27/2012 Document Reviewed: 06/02/2007 [facility] Patient Information 2014 [place], [place].  Post Anesthesia Home Care Instructions  Activity: Get plenty of rest for the remainder of the day. A responsible adult should stay with you for 24 hours following the procedure.  For the next 24 hours, DO NOT: -Drive a car -Paediatric nurse -Drink alcoholic beverages -Take any medication unless instructed by your physician -Make any legal decisions or sign important papers.  Meals: Start with liquid foods such as gelatin or soup. Progress to regular foods as tolerated. Avoid greasy, spicy, heavy foods. If nausea and/or vomiting occur, drink only clear liquids until the nausea and/or vomiting subsides. Call your physician if vomiting continues.  Special Instructions/Symptoms: Your throat may feel dry or sore from the anesthesia or the breathing tube placed in your throat during surgery. If this causes discomfort, gargle with  warm salt water. The discomfort should disappear within 24 hours.  Post Anesthesia Home Care Instructions  Activity: Get plenty of rest for the remainder of the day. A responsible adult should stay with you for 24 hours following the procedure.  For the next 24 hours, DO NOT: -Drive a car -Paediatric nurse -Drink alcoholic beverages -Take any medication unless instructed by your physician -Make any legal decisions or sign important papers.  Meals: Start with liquid foods such as gelatin or soup. Progress to regular foods as tolerated. Avoid greasy, spicy, heavy foods. If nausea and/or vomiting occur, drink only clear liquids until the nausea and/or vomiting subsides. Call your physician if vomiting continues.  Special Instructions/Symptoms: Your throat may feel dry or sore from the anesthesia or the breathing tube placed in your throat during surgery. If this causes discomfort, gargle with warm salt water. The discomfort should disappear within 24 hours.

## 2013-11-15 NOTE — H&P (View-Only) (Signed)
Alyssa Holmes is a 48 y.o. female P 2-0-0-2 who presents   for a  left salpingo- oophorectomy because of persistent pelvic pain and multiple left ovarian cysts.  This patient has a history of endometriosis, polycystic ovarian syndrome and is S/P right  oophorectomy because of  ovarian torsion.  Over the past several years she's experienced intermittent pelvic pain,  but over the past 3 months has had persistent pelvic pain- right greater than left particularly with defecation and intercourse. She reports the pain as being intense and partially relieved with defecation or cessation of intercourse. She's not had any vaginitis symptoms, urinary tract symptoms or diarrhea.  She does have occasional constipation that is relieved with Magnesium supplement.  A pelvic ultrasound in March of this year showed: uterus and left ovary surgically absent but a left ovary-5.39 x 5.33 x 3.84 cm with #2 cysts: superiorly a simple cyst-2.96 x 2.79 x 2.66 cm and a second cyst, inferiorly with cystic and solid encomponents suggestive of an endometrioma-4.12 x 3.48 x 4.46 cm.  The patient was offered GYN-Oncology consult however, she declined.  She was placed on oral Toradol for pain relief  but continued to experience discomfort.  A follow up ultrasound in May 2015 showed that both left ovarian cysts had somewhat decreased in size: superior cyst-2.07 x 2.18 x 2.15 cm and the inferior cyst, now had only a solid component-2.17 x 2.23 x 2.39 cm.  A review of both medical and surgical management options were given to the patient,  but due to the protracted nature of her symptoms, past history and findings on ultrasound she wants to proceed with removal of her left ovary.   Past Medical History  OB History: G: 2: P 2-0-0-2;  SVB 1996 and 2002  GYN History: menarche: 48 YO;    PYP:[id number]    Contracepton; History of Abnormal PAP (remote) but no history of STD; Last PAP smear: March 2015  Medical History:  Hypercholesterolemia, Mitral Valve Prolapse, Polycystic Ovarian Syndrome, Thyroid Disease, Gallbladder Disease, Hemorrhoids, Bipolar Disorder, Endometriosis and Uterine Fibroids  Surgical History: 1979  Removal of Pilonidal Cyst;  1979  Appendectomy;   1993  Laparoscopic Ovarian Cystectomy; 2009 Cholecystectomy 2001 Right Salpingo-oophorectomy due to Torsion;  2012 Hysterectomy due to Endometriosis and  Uterine Fibroids Denies problems with anesthesia or history of blood transfusions  Family History: Thyroid Disease, Cardiac Vascular Disease, Elevated Cholesterol, Stroke, Diabetes Mellitus and Skin Cancer  Social History: 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 Vancomycin Hives  . Penicillins Hives     Denies sensitivity to peanuts, shellfish, soy, latex or adhesives.  ROS: Admits to glasses, constipation, hemorroids and occasional problems stopping urine flow;   Denies headache, vision changes, nasal congestion, dysphagia, tinnitus, dizziness, hoarseness, cough,  chest pain, shortness of breath, nausea, vomiting, diarrhea,constipation,  urinary frequency, urgency  dysuria, hematuria, vaginitis symptoms, swelling of joints,easy bruising,  myalgias, arthralgias, skin rashes, unexplained weight loss and except as is mentioned in the history of present illness, patient's review of systems is otherwise negative.  Physical Exam  Bp: 104/60  P: 70   R: 16  Temperature: 98.8 degrees F orally Weight: 170 lbs.  Height: 5\' 5"    BMI: 28.3  Neck: supple without masses or thyromegaly Lungs: clear to auscultation  Heart: regular rate and rhythm Abdomen: soft, left lower quadrant tenderness with guarding and no organomegaly Pelvic:EGBUS- wnl; vagina-normal rugae;  uterus/cervix-surgically absent;  adnexae-right-non-tender but left tenderness with no palpable masses Extremities:  no clubbing, cyanosis or edema   Assesment: Pelvic Pain           Complex Left  Ovarian Cyst   Disposition: Reviewed the risks of surgery to include, but not limited to: reaction to anesthesia, damage to adjacent organs, infection and excessive bleeding. The patient verbalized understanding of these risks and has consented to proceed with Laparoscopic Left Salpingo-oophorectomy on November 15, 2013 at 1:30 p.m. at [facility].  CSN# [id number]   Tamira Ryland J. Florene Glen, PA-C  for Dr. Franklyn Lor. Dillard

## 2013-11-15 NOTE — Anesthesia Postprocedure Evaluation (Signed)
  Anesthesia Post-op Note  Patient: Alyssa Holmes  Procedure(s) Performed: Procedure(s): LAPAROSCOPY OPERATIVE WITH LEFT SALPINGO-OOPHORECTOMY/LYSIS OF ADHESIONS/REMOVAL OF LEFT OVARIAN CYST (Left)  Patient Location: PACU  Anesthesia Type:General  Level of Consciousness: awake, alert  and oriented  Airway and Oxygen Therapy: Patient Spontanous Breathing  Post-op Pain: mild  Post-op Assessment: Post-op Vital signs reviewed, Patient's Cardiovascular Status Stable, Respiratory Function Stable, Patent Airway, No signs of Nausea or vomiting and Pain level controlled  Post-op Vital Signs: Reviewed and stable  Last Vitals:  Filed Vitals:   11/15/13 1745  BP: 115/67  Pulse: 89  Temp: 36.8 C  Resp: 16    Complications: No apparent anesthesia complications

## 2013-11-15 NOTE — Interval H&P Note (Signed)
History and Physical Interval Note:  11/15/2013 1:25 PM  Alyssa Holmes  has presented today for surgery, with the diagnosis of Ovarian Cyst  The various methods of treatment have been discussed with the patient and family. After consideration of risks, benefits and other options for treatment, the patient has consented to  Procedure(s): LAPAROSCOPY OPERATIVE WITH OOPHORECTOMY (N/A) as a surgical intervention .  The patient's history has been reviewed, patient examined, no change in status, stable for surgery.  I have reviewed the patient's chart and labs.  Questions were answered to the patient's satisfaction.   Pt right ovary is surgically absent.  She desires removal of left ovary and understands she will be in menopuase as a result   Sydne Krahl A Hosie Sharman

## 2013-11-15 NOTE — Anesthesia Preprocedure Evaluation (Signed)
Anesthesia Evaluation  Patient identified by MRN, date of birth, ID band Patient awake    Reviewed: Allergy & Precautions, H&P , NPO status , Patient's Chart, lab work & pertinent test results  Airway Mallampati: I TM Distance: >3 FB Neck ROM: full    Dental no notable dental hx. (+) Teeth Intact   Pulmonary former smoker,    Pulmonary exam normal       Cardiovascular negative cardio ROS      Neuro/Psych    GI/Hepatic negative GI ROS, Neg liver ROS,   Endo/Other    Renal/GU negative Renal ROS     Musculoskeletal   Abdominal Normal abdominal exam  (+)   Peds  Hematology negative hematology ROS (+)   Anesthesia Other Findings   Reproductive/Obstetrics negative OB ROS                           Anesthesia Physical Anesthesia Plan  ASA: II  Anesthesia Plan: General   Post-op Pain Management:    Induction: Intravenous  Airway Management Planned: Oral ETT  Additional Equipment:   Intra-op Plan:   Post-operative Plan: Extubation in OR  Informed Consent: I have reviewed the patients History and Physical, chart, labs and discussed the procedure including the risks, benefits and alternatives for the proposed anesthesia with the patient or authorized representative who has indicated his/her understanding and acceptance.   Dental Advisory Given  Plan Discussed with: CRNA and Surgeon  Anesthesia Plan Comments:         Anesthesia Quick Evaluation

## 2013-11-15 NOTE — Transfer of Care (Signed)
Immediate Anesthesia Transfer of Care Note  Patient: Alyssa Holmes  Procedure(s) Performed: Procedure(s): LAPAROSCOPY OPERATIVE WITH LEFT SALPINGO-OOPHORECTOMY/LYSIS OF ADHESIONS/REMOVAL OF LEFT OVARIAN CYST (Left)  Patient Location: PACU  Anesthesia Type:General  Level of Consciousness: awake  Airway & Oxygen Therapy: Patient Spontanous Breathing  Post-op Assessment: Report given to PACU RN  Post vital signs: stable  Filed Vitals:   11/15/13 1208  BP: 129/82  Pulse: 83  Temp: 36.8 C  Resp: 20    Complications: No apparent anesthesia complications

## 2013-11-16 ENCOUNTER — Encounter (HOSPITAL_COMMUNITY): Payer: Self-pay | Admitting: Obstetrics and Gynecology

## 2014-02-09 ENCOUNTER — Encounter: Payer: Self-pay | Admitting: Interventional Cardiology

## 2014-02-09 DIAGNOSIS — F319 Bipolar disorder, unspecified: Secondary | ICD-10-CM | POA: Insufficient documentation

## 2014-02-09 DIAGNOSIS — D219 Benign neoplasm of connective and other soft tissue, unspecified: Secondary | ICD-10-CM | POA: Insufficient documentation

## 2014-02-09 DIAGNOSIS — I454 Nonspecific intraventricular block: Secondary | ICD-10-CM | POA: Insufficient documentation

## 2014-02-09 DIAGNOSIS — K859 Acute pancreatitis without necrosis or infection, unspecified: Secondary | ICD-10-CM | POA: Insufficient documentation

## 2014-02-09 DIAGNOSIS — N8110 Cystocele, unspecified: Secondary | ICD-10-CM | POA: Insufficient documentation

## 2014-02-09 DIAGNOSIS — R0602 Shortness of breath: Secondary | ICD-10-CM | POA: Insufficient documentation

## 2014-02-09 DIAGNOSIS — E039 Hypothyroidism, unspecified: Secondary | ICD-10-CM | POA: Insufficient documentation

## 2014-02-09 DIAGNOSIS — N83519 Torsion of ovary and ovarian pedicle, unspecified side: Secondary | ICD-10-CM | POA: Insufficient documentation

## 2014-02-09 DIAGNOSIS — N809 Endometriosis, unspecified: Secondary | ICD-10-CM | POA: Insufficient documentation

## 2014-04-17 ENCOUNTER — Encounter: Payer: Self-pay | Admitting: Interventional Cardiology

## 2014-06-15 ENCOUNTER — Inpatient Hospital Stay (HOSPITAL_COMMUNITY)
Admission: EM | Admit: 2014-06-15 | Discharge: 2014-06-15 | DRG: 392 | Disposition: A | Discharge status: Home / Self Care | Payer: Federal, State, Local not specified - PPO | Attending: Emergency Medicine | Admitting: Emergency Medicine

## 2014-06-15 ENCOUNTER — Encounter (HOSPITAL_COMMUNITY): Payer: Self-pay | Admitting: *Deleted

## 2014-06-15 ENCOUNTER — Inpatient Hospital Stay (HOSPITAL_COMMUNITY): Payer: Federal, State, Local not specified - PPO

## 2014-06-15 DIAGNOSIS — R0902 Hypoxemia: Secondary | ICD-10-CM | POA: Diagnosis present

## 2014-06-15 DIAGNOSIS — R1012 Left upper quadrant pain: Secondary | ICD-10-CM | POA: Diagnosis not present

## 2014-06-15 DIAGNOSIS — Z87891 Personal history of nicotine dependence: Secondary | ICD-10-CM | POA: Diagnosis not present

## 2014-06-15 DIAGNOSIS — E039 Hypothyroidism, unspecified: Secondary | ICD-10-CM | POA: Diagnosis present

## 2014-06-15 DIAGNOSIS — R109 Unspecified abdominal pain: Secondary | ICD-10-CM | POA: Diagnosis present

## 2014-06-15 LAB — URINALYSIS, ROUTINE W REFLEX MICROSCOPIC
Bilirubin Urine: NEGATIVE
Glucose, UA: NEGATIVE mg/dL
Ketones, ur: NEGATIVE mg/dL
NITRITE: NEGATIVE
PH: 5.5 (ref 5.0–8.0)
Protein, ur: NEGATIVE mg/dL
SPECIFIC GRAVITY, URINE: 1.016 (ref 1.005–1.030)
UROBILINOGEN UA: 0.2 mg/dL (ref 0.0–1.0)

## 2014-06-15 LAB — LIPASE, BLOOD: Lipase: 49 U/L (ref 11–59)

## 2014-06-15 LAB — CBC WITH DIFFERENTIAL/PLATELET
BASOS PCT: 0 % (ref 0–1)
Basophils Absolute: 0 10*3/uL (ref 0.0–0.1)
EOS ABS: 0.3 10*3/uL (ref 0.0–0.7)
Eosinophils Relative: 3 % (ref 0–5)
HCT: 44.6 % (ref 36.0–46.0)
Hemoglobin: 15.7 g/dL — ABNORMAL HIGH (ref 12.0–15.0)
LYMPHS ABS: 0.8 10*3/uL (ref 0.7–4.0)
Lymphocytes Relative: 7 % — ABNORMAL LOW (ref 12–46)
MCH: 30.2 pg (ref 26.0–34.0)
MCHC: 35.2 g/dL (ref 30.0–36.0)
MCV: 85.8 fL (ref 78.0–100.0)
Monocytes Absolute: 0.6 10*3/uL (ref 0.1–1.0)
Monocytes Relative: 5 % (ref 3–12)
Neutro Abs: 8.9 10*3/uL — ABNORMAL HIGH (ref 1.7–7.7)
Neutrophils Relative %: 85 % — ABNORMAL HIGH (ref 43–77)
PLATELETS: 226 10*3/uL (ref 150–400)
RBC: 5.2 MIL/uL — AB (ref 3.87–5.11)
RDW: 13.3 % (ref 11.5–15.5)
WBC: 10.6 10*3/uL — ABNORMAL HIGH (ref 4.0–10.5)

## 2014-06-15 LAB — COMPREHENSIVE METABOLIC PANEL
ALT: 34 U/L (ref 0–35)
AST: 29 U/L (ref 0–37)
Albumin: 4.3 g/dL (ref 3.5–5.2)
Alkaline Phosphatase: 80 U/L (ref 39–117)
Anion gap: 8 (ref 5–15)
BILIRUBIN TOTAL: 0.8 mg/dL (ref 0.3–1.2)
BUN: 16 mg/dL (ref 6–23)
CHLORIDE: 103 meq/L (ref 96–112)
CO2: 27 mmol/L (ref 19–32)
Calcium: 9.3 mg/dL (ref 8.4–10.5)
Creatinine, Ser: 0.83 mg/dL (ref 0.50–1.10)
GFR, EST NON AFRICAN AMERICAN: 82 mL/min — AB (ref >90)
Glucose, Bld: 115 mg/dL — ABNORMAL HIGH (ref 70–99)
Potassium: 4 mmol/L (ref 3.5–5.1)
Sodium: 138 mmol/L (ref 135–145)
Total Protein: 6.9 g/dL (ref 6.0–8.3)

## 2014-06-15 LAB — URINE MICROSCOPIC-ADD ON

## 2014-06-15 MED ORDER — ONDANSETRON HCL 4 MG PO TABS: 4.0000 mg | ORAL_TABLET | Freq: Four times a day (QID) | ORAL | Status: DC

## 2014-06-15 MED ORDER — DIPHENHYDRAMINE HCL 25 MG PO CAPS
25.0000 mg | ORAL_CAPSULE | Freq: Once | ORAL | Status: AC
  Administered 2014-06-15: 25 mg via ORAL
  Filled 2014-06-15: qty 1

## 2014-06-15 MED ORDER — PANTOPRAZOLE SODIUM 40 MG PO TBEC: 40.0000 mg | DELAYED_RELEASE_TABLET | Freq: Every day | ORAL | Status: DC

## 2014-06-15 MED ORDER — FAMOTIDINE 20 MG PO TABS
20.0000 mg | ORAL_TABLET | Freq: Once | ORAL | Status: AC
  Administered 2014-06-15: 20 mg via ORAL
  Filled 2014-06-15: qty 1

## 2014-06-15 MED ORDER — SODIUM CHLORIDE 0.9 % IV BOLUS (SEPSIS)
1000.0000 mL | Freq: Once | INTRAVENOUS | Status: AC
  Administered 2014-06-15: 1000 mL via INTRAVENOUS

## 2014-06-15 MED ORDER — OXYCODONE-ACETAMINOPHEN 5-325 MG PO TABS
1.0000 | ORAL_TABLET | Freq: Once | ORAL | Status: AC
  Administered 2014-06-15: 1 via ORAL
  Filled 2014-06-15: qty 1

## 2014-06-15 MED ORDER — PANTOPRAZOLE SODIUM 40 MG PO TBEC
40.0000 mg | DELAYED_RELEASE_TABLET | Freq: Once | ORAL | Status: AC
  Administered 2014-06-15: 40 mg via ORAL
  Filled 2014-06-15: qty 1

## 2014-06-15 MED ORDER — GI COCKTAIL ~~LOC~~
30.0000 mL | Freq: Once | ORAL | Status: AC
  Administered 2014-06-15: 30 mL via ORAL
  Filled 2014-06-15: qty 30

## 2014-06-15 MED ORDER — ONDANSETRON HCL 4 MG/2ML IJ SOLN
4.0000 mg | Freq: Once | INTRAMUSCULAR | Status: AC
  Administered 2014-06-15: 4 mg via INTRAVENOUS
  Filled 2014-06-15: qty 2

## 2014-06-15 NOTE — ED Notes (Signed)
Pt tolerating oral fluids. States she feels much better.

## 2014-06-15 NOTE — Discharge Instructions (Signed)
Abdominal Pain, Women °Abdominal (stomach, pelvic, or belly) pain can be caused by many things. It is important to tell your doctor: °· The location of the pain. °· Does it come and go or is it present all the time? °· Are there things that start the pain (eating certain foods, exercise)? °· Are there other symptoms associated with the pain (fever, nausea, vomiting, diarrhea)? °All of this is helpful to know when trying to find the cause of the pain. °CAUSES  °· Stomach: virus or bacteria infection, or ulcer. °· Intestine: appendicitis (inflamed appendix), regional ileitis (Crohn's disease), ulcerative colitis (inflamed colon), irritable bowel syndrome, diverticulitis (inflamed diverticulum of the colon), or cancer of the stomach or intestine. °· Gallbladder disease or stones in the gallbladder. °· Kidney disease, kidney stones, or infection. °· Pancreas infection or cancer. °· Fibromyalgia (pain disorder). °· Diseases of the female organs: °¨ Uterus: fibroid (non-cancerous) tumors or infection. °¨ Fallopian tubes: infection or tubal pregnancy. °¨ Ovary: cysts or tumors. °¨ Pelvic adhesions (scar tissue). °¨ Endometriosis (uterus lining tissue growing in the pelvis and on the pelvic organs). °¨ Pelvic congestion syndrome (female organs filling up with blood just before the menstrual period). °¨ Pain with the menstrual period. °¨ Pain with ovulation (producing an egg). °¨ Pain with an IUD (intrauterine device, birth control) in the uterus. °¨ Cancer of the female organs. °· Functional pain (pain not caused by a disease, may improve without treatment). °· Psychological pain. °· Depression. °DIAGNOSIS  °Your doctor will decide the seriousness of your pain by doing an examination. °· Blood tests. °· X-rays. °· Ultrasound. °· CT scan (computed tomography, special type of X-ray). °· MRI (magnetic resonance imaging). °· Cultures, for infection. °· Barium enema (dye inserted in the large intestine, to better view it with  X-rays). °· Colonoscopy (looking in intestine with a lighted tube). °· Laparoscopy (minor surgery, looking in abdomen with a lighted tube). °· Major abdominal exploratory surgery (looking in abdomen with a large incision). °TREATMENT  °The treatment will depend on the cause of the pain.  °· Many cases can be observed and treated at home. °· Over-the-counter medicines recommended by your caregiver. °· Prescription medicine. °· Antibiotics, for infection. °· Birth control pills, for painful periods or for ovulation pain. °· Hormone treatment, for endometriosis. °· Nerve blocking injections. °· Physical therapy. °· Antidepressants. °· Counseling with a psychologist or psychiatrist. °· Minor or major surgery. °HOME CARE INSTRUCTIONS  °· Do not take laxatives, unless directed by your caregiver. °· Take over-the-counter pain medicine only if ordered by your caregiver. Do not take aspirin because it can cause an upset stomach or bleeding. °· Try a clear liquid diet (broth or water) as ordered by your caregiver. Slowly move to a bland diet, as tolerated, if the pain is related to the stomach or intestine. °· Have a thermometer and take your temperature several times a day, and record it. °· Bed rest and sleep, if it helps the pain. °· Avoid sexual intercourse, if it causes pain. °· Avoid stressful situations. °· Keep your follow-up appointments and tests, as your caregiver orders. °· If the pain does not go away with medicine or surgery, you may try: °¨ Acupuncture. °¨ Relaxation exercises (yoga, meditation). °¨ Group therapy. °¨ Counseling. °SEEK MEDICAL CARE IF:  °· You notice certain foods cause stomach pain. °· Your home care treatment is not helping your pain. °· You need stronger pain medicine. °· You want your IUD removed. °· You feel faint or   lightheaded. °· You develop nausea and vomiting. °· You develop a rash. °· You are having side effects or an allergy to your medicine. °SEEK IMMEDIATE MEDICAL CARE IF:  °· Your  pain does not go away or gets worse. °· You have a fever. °· Your pain is felt only in portions of the abdomen. The right side could possibly be appendicitis. The left lower portion of the abdomen could be colitis or diverticulitis. °· You are passing blood in your stools (bright red or black tarry stools, with or without vomiting). °· You have blood in your urine. °· You develop chills, with or without a fever. °· You pass out. °MAKE SURE YOU:  °· Understand these instructions. °· Will watch your condition. °· Will get help right away if you are not doing well or get worse. °Document Released: 03/30/2007 Document Revised: 10/17/2013 Document Reviewed: 04/19/2009 °[employer]® Patient Information ©2015 [employer]. This information is not intended to replace advice given to you by your health care provider. Make sure you discuss any questions you have with your health care provider. ° °

## 2014-06-15 NOTE — ED Notes (Signed)
Pt returned from xray

## 2014-06-15 NOTE — ED Notes (Signed)
Pt reports hx of pancreatitis, recent changes in meds and now having return of mid abd pain that radiates through to her back and having nausea. No acute distress noted at triage.

## 2014-06-15 NOTE — ED Notes (Signed)
Patient profusely vomiting

## 2014-06-15 NOTE — ED Provider Notes (Signed)
CSN: [id number]     Arrival date & time 06/15/14  1315 History   First MD Initiated Contact with Patient 06/15/14 1546     Chief Complaint  Patient presents with  . Abdominal Pain     (Consider location/radiation/quality/duration/timing/severity/associated sxs/prior Treatment) Patient is a 48 y.o. female presenting with abdominal pain. The history is provided by the patient and the spouse.  Abdominal Pain Pain location:  LUQ Pain quality: sharp   Pain radiates to:  Epigastric region Pain severity:  Severe Onset quality:  Sudden Duration:  1 day Timing:  Constant Progression:  Unchanged Chronicity:  Recurrent Context: eating (had just eaten a cheese burger)   Relieved by:  Nothing Worsened by:  Eating and palpation Ineffective treatments: creon. Associated symptoms: nausea   Associated symptoms: no chest pain, no chills, no constipation, no cough, no dysuria, no fatigue, no fever, no shortness of breath, no sore throat, no vaginal bleeding, no vaginal discharge and no vomiting     Past Medical History  Diagnosis Date  . Elevated cholesterol     dx in early 42s  . Mitral valve prolapse     during adolescence  . Bundle branch block   . Shortness of breath   . Hypothyroidism   . Bipolar disorder   . Headache(784.0)   . Fibroid   . Endometriosis   . Pancreatitis   . Ovarian torsion   . Female bladder prolapse    Past Surgical History  Procedure Laterality Date  . Appendectomy    . Cholecystectomy    . Abdominal hysterectomy    . Hernia repair    . Colonoscopy    . Dilation and curettage of uterus    . Esophagogastroduodenoscopy (egd) with propofol N/A 09/09/2013    Procedure: ESOPHAGOGASTRODUODENOSCOPY (EGD) WITH PROPOFOL;  Surgeon: Arta Silence, MD;  Location: [facility];  Service: Endoscopy;  Laterality: N/A;  . Eus N/A 09/09/2013    Procedure: ESOPHAGEAL ENDOSCOPIC ULTRASOUND (EUS) RADIAL;  Surgeon: Arta Silence, MD;  Location: [facility];  Service:  Endoscopy;  Laterality: N/A;  . Oophorectomy    . Laparoscopy Left 11/15/2013    Procedure: LAPAROSCOPY OPERATIVE WITH LEFT SALPINGO-OOPHORECTOMY/LYSIS OF ADHESIONS/REMOVAL OF LEFT OVARIAN CYST;  Surgeon: Betsy Coder, MD;  Location: [facility];  Service: Gynecology;  Laterality: Left;   Family History  Problem Relation Age of Onset  . Thyroid disease Mother   . Heart disease Mother   . Diabetes Mother   . Asthma Father   . Stroke Father   . Cancer - Colon Other    History  Substance Use Topics  . Smoking status: Former Smoker    Types: Cigarettes  . Smokeless tobacco: Never Used  . Alcohol Use: Yes     Comment: couple drinks a week   OB History    Gravida Para Term Preterm AB TAB SAB Ectopic Multiple Living   2 2 2       2      Review of Systems  Constitutional: Negative for fever, chills, diaphoresis, activity change, appetite change and fatigue.  HENT: Negative for facial swelling, rhinorrhea, sore throat, trouble swallowing and voice change.   Eyes: Negative for photophobia, pain and visual disturbance.  Respiratory: Negative for cough, shortness of breath, wheezing and stridor.   Cardiovascular: Negative for chest pain, palpitations and leg swelling.  Gastrointestinal: Positive for nausea and abdominal pain. Negative for vomiting, constipation and anal bleeding.  Endocrine: Negative.   Genitourinary: Negative for dysuria, vaginal bleeding, vaginal discharge and  vaginal pain.  Musculoskeletal: Negative for myalgias, back pain and arthralgias.  Skin: Negative.  Negative for rash.  Allergic/Immunologic: Negative.   Neurological: Negative for dizziness, tremors, syncope, weakness and headaches.  Psychiatric/Behavioral: Negative for suicidal ideas, sleep disturbance and self-injury.  All other systems reviewed and are negative.     Allergies  Vancomycin and Penicillins  Home Medications   Prior to Admission medications   Medication Sig Start Date End Date Taking?  Authorizing Provider  aspirin 81 MG tablet Take 81 mg by mouth at bedtime.    Yes Historical Provider, MD  b complex vitamins tablet Take 1 tablet by mouth daily.   Yes Historical Provider, MD  BIOTIN PO Take 1 tablet by mouth daily.   Yes Historical Provider, MD  Cholecalciferol (VITAMIN D3) 2000 UNITS TABS Take 1 tablet by mouth daily.   Yes Historical Provider, MD  CINNAMON PO Take 1,000 mg by mouth daily.    Yes Historical Provider, MD  ibuprofen (ADVIL,MOTRIN) 600 MG tablet 1  po pc every 6 hours x 3 days then as needed for pain 11/15/13  Yes Earnstine Regal, PA-C  lamoTRIgine (LAMICTAL) 200 MG tablet Take 200 mg by mouth at bedtime.    Yes Historical Provider, MD  levothyroxine (SYNTHROID, LEVOTHROID) 88 MCG tablet Take 88 mcg by mouth daily before breakfast.   Yes Historical Provider, MD  MAGNESIUM PO Take 1 tablet by mouth daily.   Yes Historical Provider, MD  Misc Natural Products (APPLE CIDER VINEGAR) TABS Take 2 tablets by mouth daily.    Yes Historical Provider, MD  Multiple Vitamins-Minerals (MULTIVITAMIN WITH MINERALS) tablet Take 1 tablet by mouth daily.   Yes Historical Provider, MD  NIACIN PO Take 500 mg by mouth daily.    Yes Historical Provider, MD  nitroGLYCERIN (NITROSTAT) 0.4 MG SL tablet Place 1 tablet (0.4 mg total) under the tongue every 5 (five) minutes x 3 doses as needed for chest pain. 09/06/13  Yes Eileen Stanford, PA-C  oxyCODONE-acetaminophen (ROXICET) 5-325 MG per tablet Take 1 tablet by mouth every 6 (six) hours as needed for moderate pain or severe pain. 11/15/13  Yes Elmira Powell, PA-C  Pancrelipase, Lip-Prot-Amyl, (CREON PO) Take by mouth 2 (two) times daily before a meal.   Yes Historical Provider, MD  Probiotic Product (PROBIOTIC DAILY PO) Take 1 capsule by mouth daily.   Yes Historical Provider, MD  venlafaxine XR (EFFEXOR-XR) 75 MG 24 hr capsule Take 75 mg by mouth daily with breakfast.   Yes Historical Provider, MD  pantoprazole (PROTONIX) 40 MG tablet Take 1  tablet (40 mg total) by mouth daily. 06/15/14   Margaretann Loveless, MD   BP 105/66 mmHg  Pulse 92  Temp(Src) 98.2 F (36.8 C) (Oral)  Resp 18  Ht 5\' 5"  (1.651 m)  Wt 164 lb (74.39 kg)  BMI 27.29 kg/m2  SpO2 96% Physical Exam  Constitutional: She is oriented to person, place, and time. She appears well-developed and well-nourished. No distress.  HENT:  Head: Normocephalic and atraumatic.  Right Ear: External ear normal.  Left Ear: External ear normal.  Mouth/Throat: Oropharynx is clear and moist. No oropharyngeal exudate.  Eyes: Conjunctivae and EOM are normal. Pupils are equal, round, and reactive to light. No scleral icterus.  Neck: Normal range of motion. Neck supple. No JVD present. No tracheal deviation present. No thyromegaly present.  Cardiovascular: Normal rate, regular rhythm and intact distal pulses.  Exam reveals no gallop and no friction rub.   No murmur heard. Pulmonary/Chest:  Effort normal and breath sounds normal. No respiratory distress. She has no wheezes. She has no rales.  Abdominal: Soft. Bowel sounds are normal. She exhibits no distension. There is tenderness (Mild tenderness to LUQ. No other abdominal tenderness).  Musculoskeletal: Normal range of motion. She exhibits no edema or tenderness.  Neurological: She is alert and oriented to person, place, and time. No cranial nerve deficit. She exhibits normal muscle tone. Coordination normal.  Skin: Skin is warm and dry. She is not diaphoretic. No pallor.  Psychiatric: She has a normal mood and affect. She expresses no homicidal and no suicidal ideation. She expresses no suicidal plans and no homicidal plans.  Nursing note and vitals reviewed.   ED Course  Procedures (including critical care time) Labs Review Labs Reviewed  CBC WITH DIFFERENTIAL - Abnormal; Notable for the following:    WBC 10.6 (*)    RBC 5.20 (*)    Hemoglobin 15.7 (*)    Neutrophils Relative % 85 (*)    Neutro Abs 8.9 (*)    Lymphocytes Relative 7  (*)    All other components within normal limits  COMPREHENSIVE METABOLIC PANEL - Abnormal; Notable for the following:    Glucose, Bld 115 (*)    GFR calc non Af Amer 82 (*)    All other components within normal limits  URINALYSIS, ROUTINE W REFLEX MICROSCOPIC - Abnormal; Notable for the following:    Hgb urine dipstick TRACE (*)    Leukocytes, UA MODERATE (*)    All other components within normal limits  URINE MICROSCOPIC-ADD ON - Abnormal; Notable for the following:    Squamous Epithelial / LPF FEW (*)    All other components within normal limits  URINE CULTURE  LIPASE, BLOOD    Imaging Review Dg Abd Acute W/chest  06/15/2014   CLINICAL DATA:  Epigastric abdominal pain.  EXAM: ACUTE ABDOMEN SERIES (ABDOMEN 2 VIEW & CHEST 1 VIEW)  COMPARISON:  September 05, 2013.  FINDINGS: There is no evidence of dilated bowel loops or free intraperitoneal air. No radiopaque calculi or other significant radiographic abnormality is seen. Status post cholecystectomy. Heart size and mediastinal contours are within normal limits. Both lungs are clear.  IMPRESSION: Negative abdominal radiographs.  No acute cardiopulmonary disease.   Electronically Signed   By: Sabino Dick M.D.   On: 06/15/2014 19:21     EKG Interpretation None      MDM   Final diagnoses:  LUQ abdominal pain    The patient is a 48 y.o. F w/ hx of pancreatitis who presents with 1 day of LUQ sharp abdominal pain that started after eating a cheeseburger yesterday. Patient reports pain is similar to previous pancreatitis but not exactly the same as it is a little more left sided and does not get better with creon unlike her previous "pancreas attacks". Patient AFVSS and appears very comfortable throughout her hospital stay. Labs including lipase unremarkable. Acute abdominal series negative for any signs of SBO. Patient given GI cocktail with moderate improvement in pain and patient is able to tolerate PO well by drinking water and eating gram  crackers while in the ED. Feel patient likely has gastritis and will treat with PPI and PCP follow up. Patient instructed to stop taking ibuprofen or other NSAIDs until symptoms completely resolve (patient endorses daily ibuprofen use recently).   I estimate there is LOW risk for ACUTE APPENDICITIS, BOWEL OBSTRUCTION, CHOLECYSTITIS, DIVERTICULITIS, INCARCERATED HERNIA, MESENTERIC ISCHEMIA, PANCREATITIS, or PERFORATED BOWEL or ULCER, thus I consider  the discharge disposition reasonable. Also, there is no evidence or peritonitis, sepsis or toxicity. We have discussed the diagnosis and risks and agree with discharging home to follow-up with their primary doctor. We also discussed returning to the Emergency Department immediately if new or worsening symptoms occur and have discussed the symptoms which are most concerning (e.g., bloody stool, fever, changing or worsening pain, vomiting) that necessitate immediate return.  Patient seen with attending, Dr. Johnney Killian, who oversaw clinical decision making.     Margaretann Loveless, MD 06/15/14 2025  Charlesetta Shanks, MD 06/16/14 [phone]

## 2014-06-18 LAB — URINE CULTURE
Colony Count: NO GROWTH
Culture: NO GROWTH

## 2014-08-14 ENCOUNTER — Other Ambulatory Visit: Payer: Self-pay

## 2014-08-14 DIAGNOSIS — Z1231 Encounter for screening mammogram for malignant neoplasm of breast: Secondary | ICD-10-CM

## 2014-09-06 ENCOUNTER — Ambulatory Visit
Admission: RE | Admit: 2014-09-06 | Discharge: 2014-09-06 | Disposition: A | Discharge status: Home / Self Care | Payer: Federal, State, Local not specified - PPO | Source: Ambulatory Visit

## 2014-09-06 DIAGNOSIS — Z1231 Encounter for screening mammogram for malignant neoplasm of breast: Secondary | ICD-10-CM

## 2015-04-09 ENCOUNTER — Ambulatory Visit
Admit: 2015-04-09 | Discharge: 2015-04-09 | Payer: PRIVATE HEALTH INSURANCE | Attending: Orthopaedic Surgery | Primary: Family Medicine

## 2015-04-09 DIAGNOSIS — M5136 Other intervertebral disc degeneration, lumbar region: Secondary | ICD-10-CM

## 2015-04-09 NOTE — Progress Notes (Signed)
Chief Complaint   Patient presents with   ??? Hip Pain     left hip pain for 2-3 years        Lauren Hull  Is a 49 y.o.  Caucasian female who presents today complaining of left hip pain. Pt. States that the pain began 3  year(s) ago. Patient did not have a history of injury.  Patients states pain is located lateral, over greater trochanter and has tenderness over the  buttock portion of the hip. Hip pain is described as aching and  is aggravated by walking and is worse after period of inactivity along with diffuse and lateral discomfort. Patient states the pain occurs in the  continuous. Patient states hip pain is relieved by rest. Patient does not have a history of back pain.  The patient does not use ambulatory aid.  The patients occupation is at [employer].  There is an occasional "burning" down the left lower extremity    Past Medical History   Diagnosis Date   ??? Anxiety      History reviewed. No pertinent past surgical history.    Current Outpatient Prescriptions:   ???  sertraline (ZOLOFT) 50 MG tablet, take 1 tablet by mouth once daily, Disp: , Rfl: 0  Allergies   Allergen Reactions   ??? Ibuprofen      Social History     Social History   ??? Marital status: Single     Spouse name: N/A   ??? Number of children: N/A   ??? Years of education: N/A     Occupational History   ??? Not on file.     Social History Main Topics   ??? Smoking status: Current Every Day Smoker     Packs/day: 0.50   ??? Smokeless tobacco: Not on file   ??? Alcohol use No   ??? Drug use: Not on file   ??? Sexual activity: Not on file     Other Topics Concern   ??? Not on file     Social History Narrative   ??? No narrative on file     History reviewed. No pertinent family history.      REVIEW OF SYSTEMS:     General/Constitution:  (-)weight loss, (-)fever, (-)chills, (-)weakness.   Skin: (-) rash,(-) psoriasis,(-) eczema, (-)skin cancer.   Musculoskeletal: (-) fractures,  (-) dislocations,(-) collagen vascular disease, (-) fibromyalgia, (-) multiple sclerosis, (-)  muscular dystrophy, (-) RSD,(-) joint pain (-)swelling, (-) joint pain,swelling.  Neurologic: (-) epilepsy, (-)seizures,(-) brain tumor,(-) TIA, (-)stroke, (-)headaches, (-)Parkinson disease,(-) memory loss, (-) LOC.  Cardiovascular: (-) Chest pain, (-) swelling in legs/feet, (-) SOB, (-) cramping in legs/feet with walking.  Respiratory: (-) SOB, (-) Coughing, (-) night sweats.  GI: (-) nausea, (-) vomiting, (-) diarrhea, (-) blood in stool, (-) gastric ulcer.  Psychiatric: (-) Depression, (-) Anxiety, (-) bipolar disease, (-) Alzheimer's Disease  Allergic/Immunologic: (-) allergies latex, (-) allergies metal, (-) skin sensitivity.  Hematlogic: (-) anemia, (-) blood transfusion, (-) DVT/PE, (-) Clotting disorders      Subjective:      Constitution:    Ht. 5 ft 7 in. , Wt. 208 lbs.    Psycihatric:    The patient is alert and oriented x 3, appears to be stated age and in no distress.      Respiratory:    Respiratory effort is not labored.  Patient is not gasping.  Palpation of the chest reveals no tactile fremitus.    Skin:    Upon inspection:  the skin appears warm, dry and intact.  There is not a previous scar over the affected area.There is not any cellulitis, lymphedema or cutaneous lesions noted in the lower extremities.   Upon palpation there is no induration noted.          Neurologic:      Motor exam of the lower extremities show ; quadriceps, hamstrings, foot dorsi and plantar flexors intact R.  5/5 and L. 5/5. Deep tendon reflexes are 2/4 at the knees and 2/4 at the ankles with strong extensor hallicus longus motor strength bilaterally. Sensory to both feet is intact to all sensory roots.    Cardiovascular:    The vascular exam is normal and is well perfused to distal extremities.  Distal pulses DP/PT: R. 2+; L. 2+.  There is cap refill noted less than two seconds in all digits. There is not edema of the bilateral lower extremities.  There is not varicosities noted in the distal extremities.       Lymph:    Upon palpation,  there is no lymphadenopathy noted in bilateral lower extremities.          Musculoskeletal:  Gait: antalgic;  Examination of the digits and nails reveal no cyanosis or clubbing        Lumbar exam:    On visual inspection, there is deformity of the spine.   limited range of motion, pain with motion noted during exam. Special tests: Straight Leg Raise positive, Faber test positive.      Hip exam:    Upon visual inspection there is not a deformity noted. Patient complains of tenderness at the  greater trochanter and buttock.     Exam of the left hip shows none leg length discrepancy.  Range of motion of the involved/uninvolved hip is 25 degrees internal rotation and 35 degrees external rotation/25 degrees internal rotation and 35 degrees external rotation, the hip joint is stable to testing. Strength of the lower extremity is equal bilaterally.The patient does not have ipsilateral knee pain, and is described as  diffuse.  Hip exam- Gait: normal; Strength: Hip Flexors 5/5; Hip Abductors 4/5; Hip Adduction 4/5.       Knee exam :    Upon visual inspection there is not deformity noted.  The patient does not have  pain on motion, there is not tenderness over the  global region.  Range of motion of R. Knee is 0 to 120, and L. Knee is 0 to 120.  there are not any masses, there is not ligamentous instability, there is not  deformity noted.        Xrays:  Films from outside facility are normal    Radiographic findings reviewed with patient    Impression:  Encounter Diagnoses   Name Primary?   ??? DDD (degenerative disc disease), lumbar Yes   ??? Lumbar spinal stenosis        Plan:  Natural history and expected course discussed. Questions answered.  Transport planner distributed.   Due to the findings of the exam, I would recommend an MRI of the lumbar spine

## 2015-04-23 ENCOUNTER — Inpatient Hospital Stay: Admit: 2015-04-23 | Attending: Orthopaedic Surgery | Primary: Family Medicine

## 2015-04-23 DIAGNOSIS — M5136 Other intervertebral disc degeneration, lumbar region: Secondary | ICD-10-CM

## 2015-04-29 ENCOUNTER — Encounter (HOSPITAL_COMMUNITY): Payer: Self-pay

## 2015-04-29 ENCOUNTER — Emergency Department (HOSPITAL_COMMUNITY)
Admission: EM | Admit: 2015-04-29 | Discharge: 2015-04-29 | Disposition: A | Discharge status: Home / Self Care | Payer: Federal, State, Local not specified - PPO | Attending: Emergency Medicine | Admitting: Emergency Medicine

## 2015-04-29 DIAGNOSIS — Z8742 Personal history of other diseases of the female genital tract: Secondary | ICD-10-CM | POA: Insufficient documentation

## 2015-04-29 DIAGNOSIS — Z79899 Other long term (current) drug therapy: Secondary | ICD-10-CM | POA: Insufficient documentation

## 2015-04-29 DIAGNOSIS — Z880 Allergy status to penicillin: Secondary | ICD-10-CM | POA: Insufficient documentation

## 2015-04-29 DIAGNOSIS — Z8719 Personal history of other diseases of the digestive system: Secondary | ICD-10-CM | POA: Insufficient documentation

## 2015-04-29 DIAGNOSIS — Z7982 Long term (current) use of aspirin: Secondary | ICD-10-CM | POA: Diagnosis not present

## 2015-04-29 DIAGNOSIS — Z86018 Personal history of other benign neoplasm: Secondary | ICD-10-CM | POA: Insufficient documentation

## 2015-04-29 DIAGNOSIS — R0789 Other chest pain: Secondary | ICD-10-CM | POA: Insufficient documentation

## 2015-04-29 DIAGNOSIS — Z87891 Personal history of nicotine dependence: Secondary | ICD-10-CM | POA: Diagnosis not present

## 2015-04-29 DIAGNOSIS — F319 Bipolar disorder, unspecified: Secondary | ICD-10-CM | POA: Diagnosis not present

## 2015-04-29 DIAGNOSIS — R51 Headache: Secondary | ICD-10-CM | POA: Insufficient documentation

## 2015-04-29 DIAGNOSIS — E039 Hypothyroidism, unspecified: Secondary | ICD-10-CM | POA: Diagnosis not present

## 2015-04-29 DIAGNOSIS — Z8679 Personal history of other diseases of the circulatory system: Secondary | ICD-10-CM | POA: Insufficient documentation

## 2015-04-29 DIAGNOSIS — G44209 Tension-type headache, unspecified, not intractable: Secondary | ICD-10-CM

## 2015-04-29 DIAGNOSIS — Z87448 Personal history of other diseases of urinary system: Secondary | ICD-10-CM | POA: Diagnosis not present

## 2015-04-29 DIAGNOSIS — R079 Chest pain, unspecified: Secondary | ICD-10-CM | POA: Diagnosis present

## 2015-04-29 LAB — CBC WITH DIFFERENTIAL/PLATELET
BASOS ABS: 0 10*3/uL (ref 0.0–0.1)
BASOS PCT: 0 %
Eosinophils Absolute: 0.3 10*3/uL (ref 0.0–0.7)
Eosinophils Relative: 4 %
HEMATOCRIT: 42.4 % (ref 36.0–46.0)
Hemoglobin: 14.3 g/dL (ref 12.0–15.0)
Lymphocytes Relative: 33 %
Lymphs Abs: 3 10*3/uL (ref 0.7–4.0)
MCH: 28.8 pg (ref 26.0–34.0)
MCHC: 33.7 g/dL (ref 30.0–36.0)
MCV: 85.5 fL (ref 78.0–100.0)
Monocytes Absolute: 0.7 10*3/uL (ref 0.1–1.0)
Monocytes Relative: 7 %
Neutro Abs: 5.2 10*3/uL (ref 1.7–7.7)
Neutrophils Relative %: 56 %
PLATELETS: 235 10*3/uL (ref 150–400)
RBC: 4.96 MIL/uL (ref 3.87–5.11)
RDW: 12.6 % (ref 11.5–15.5)
WBC: 9.3 10*3/uL (ref 4.0–10.5)

## 2015-04-29 LAB — COMPREHENSIVE METABOLIC PANEL
ALT: 37 U/L (ref 14–54)
AST: 32 U/L (ref 15–41)
Albumin: 4.3 g/dL (ref 3.5–5.0)
Alkaline Phosphatase: 84 U/L (ref 38–126)
Anion gap: 8 (ref 5–15)
BILIRUBIN TOTAL: 0.5 mg/dL (ref 0.3–1.2)
BUN: 15 mg/dL (ref 6–20)
CHLORIDE: 103 mmol/L (ref 101–111)
CO2: 28 mmol/L (ref 22–32)
Calcium: 9.6 mg/dL (ref 8.9–10.3)
Creatinine, Ser: 0.9 mg/dL (ref 0.44–1.00)
Glucose, Bld: 102 mg/dL — ABNORMAL HIGH (ref 65–99)
POTASSIUM: 4 mmol/L (ref 3.5–5.1)
Sodium: 139 mmol/L (ref 135–145)
TOTAL PROTEIN: 6.7 g/dL (ref 6.5–8.1)

## 2015-04-29 LAB — TROPONIN I: Troponin I: <0.03 ng/mL (ref <0.031)

## 2015-04-29 LAB — LIPASE, BLOOD: Lipase: 50 U/L (ref 11–51)

## 2015-04-29 MED ORDER — NITROGLYCERIN 0.4 MG SL SUBL: 0.4000 mg | SUBLINGUAL_TABLET | SUBLINGUAL | Status: DC | PRN

## 2015-04-29 MED ORDER — PANTOPRAZOLE SODIUM 40 MG IV SOLR
40.0000 mg | Freq: Once | INTRAVENOUS | Status: AC
  Administered 2015-04-29: 40 mg via INTRAVENOUS
  Filled 2015-04-29: qty 40

## 2015-04-29 MED ORDER — PANTOPRAZOLE SODIUM 40 MG PO TBEC: 40.0000 mg | DELAYED_RELEASE_TABLET | Freq: Every day | ORAL | Status: DC

## 2015-04-29 MED ORDER — GI COCKTAIL ~~LOC~~
30.0000 mL | Freq: Once | ORAL | Status: AC
  Administered 2015-04-29: 30 mL via ORAL
  Filled 2015-04-29: qty 30

## 2015-04-29 MED ORDER — METOCLOPRAMIDE HCL 5 MG/ML IJ SOLN
10.0000 mg | Freq: Once | INTRAMUSCULAR | Status: AC
Start: 2015-04-29 — End: 2015-04-29
  Administered 2015-04-29: 10 mg via INTRAVENOUS
  Filled 2015-04-29: qty 2

## 2015-04-29 MED ORDER — SODIUM CHLORIDE 0.9 % IV SOLN
1000.0000 mL | Freq: Once | INTRAVENOUS | Status: AC
  Administered 2015-04-29: 1000 mL via INTRAVENOUS

## 2015-04-29 MED ORDER — SODIUM CHLORIDE 0.9 % IV SOLN: 1000.0000 mL | INTRAVENOUS | Status: DC

## 2015-04-29 MED ORDER — DIPHENHYDRAMINE HCL 50 MG/ML IJ SOLN
25.0000 mg | Freq: Once | INTRAMUSCULAR | Status: AC
  Administered 2015-04-29: 25 mg via INTRAVENOUS
  Filled 2015-04-29: qty 1

## 2015-04-29 NOTE — ED Notes (Signed)
Pt started having intermittent chest pain, feeling weak, jaw pain and arm pain. Pt took Tylenol at 3am and took 2 Nitro's after that with no relief. Pt denies N/V

## 2015-04-29 NOTE — ED Notes (Signed)
Patient now reporting headache and posterior neck pain. MD Roxanne Mins made aware.

## 2015-04-29 NOTE — Discharge Instructions (Signed)
Nonspecific Chest Pain  Chest pain can be caused by many different conditions. There is always a chance that your pain could be related to something serious, such as a heart attack or a blood clot in your lungs. Chest pain can also be caused by conditions that are not life-threatening. If you have chest pain, it is very important to follow up with your health care provider. CAUSES  Chest pain can be caused by:  Heartburn.  Pneumonia or bronchitis.  Anxiety or stress.  Inflammation around your heart (pericarditis) or lung (pleuritis or pleurisy).  A blood clot in your lung.  A collapsed lung (pneumothorax). It can develop suddenly on its own (spontaneous pneumothorax) or from trauma to the chest.  Shingles infection (varicella-zoster virus).  Heart attack.  Damage to the bones, muscles, and cartilage that make up your chest wall. This can include:  Bruised bones due to injury.  Strained muscles or cartilage due to frequent or repeated coughing or overwork.  Fracture to one or more ribs.  Sore cartilage due to inflammation (costochondritis). RISK FACTORS  Risk factors for chest pain may include:  Activities that increase your risk for trauma or injury to your chest.  Respiratory infections or conditions that cause frequent coughing.  Medical conditions or overeating that can cause heartburn.  Heart disease or family history of heart disease.  Conditions or health behaviors that increase your risk of developing a blood clot.  Having had chicken pox (varicella zoster). SIGNS AND SYMPTOMS Chest pain can feel like:  Burning or tingling on the surface of your chest or deep in your chest.  Crushing, pressure, aching, or squeezing pain.  Dull or sharp pain that is worse when you move, cough, or take a deep breath.  Pain that is also felt in your back, neck, shoulder, or arm, or pain that spreads to any of these areas. Your chest pain may come and go, or it may stay  constant. DIAGNOSIS Lab tests or other studies may be needed to find the cause of your pain. Your health care provider may have you take a test called an ambulatory ECG (electrocardiogram). An ECG records your heartbeat patterns at the time the test is performed. You may also have other tests, such as:  Transthoracic echocardiogram (TTE). During echocardiography, sound waves are used to create a picture of all of the heart structures and to look at how blood flows through your heart.  Transesophageal echocardiogram (TEE).This is a more advanced imaging test that obtains images from inside your body. It allows your health care provider to see your heart in finer detail.  Cardiac monitoring. This allows your health care provider to monitor your heart rate and rhythm in real time.  Holter monitor. This is a portable device that records your heartbeat and can help to diagnose abnormal heartbeats. It allows your health care provider to track your heart activity for several days, if needed.  Stress tests. These can be done through exercise or by taking medicine that makes your heart beat more quickly.  Blood tests.  Imaging tests. TREATMENT  Your treatment depends on what is causing your chest pain. Treatment may include:  Medicines. These may include:  Acid blockers for heartburn.  Anti-inflammatory medicine.  Pain medicine for inflammatory conditions.  Antibiotic medicine, if an infection is present.  Medicines to dissolve blood clots.  Medicines to treat coronary artery disease.  Supportive care for conditions that do not require medicines. This may include:  Resting.  Applying heat  or cold packs to injured areas.  Limiting activities until pain decreases. HOME CARE INSTRUCTIONS  If you were prescribed an antibiotic medicine, finish it all even if you start to feel better.  Avoid any activities that bring on chest pain.  Do not use any tobacco products, including  cigarettes, chewing tobacco, or electronic cigarettes. If you need help quitting, ask your health care provider.  Do not drink alcohol.  Take medicines only as directed by your health care provider.  Keep all follow-up visits as directed by your health care provider. This is important. This includes any further testing if your chest pain does not go away.  If heartburn is the cause for your chest pain, you may be told to keep your head raised (elevated) while sleeping. This reduces the chance that acid will go from your stomach into your esophagus.  Make lifestyle changes as directed by your health care provider. These may include:  Getting regular exercise. Ask your health care provider to suggest some activities that are safe for you.  Eating a heart-healthy diet. A registered dietitian can help you to learn healthy eating options.  Maintaining a healthy weight.  Managing diabetes, if necessary.  Reducing stress. SEEK MEDICAL CARE IF:  Your chest pain does not go away after treatment.  You have a rash with blisters on your chest.  You have a fever. SEEK IMMEDIATE MEDICAL CARE IF:   Your chest pain is worse.  You have an increasing cough, or you cough up blood.  You have severe abdominal pain.  You have severe weakness.  You faint.  You have chills.  You have sudden, unexplained chest discomfort.  You have sudden, unexplained discomfort in your arms, back, neck, or jaw.  You have shortness of breath at any time.  You suddenly start to sweat, or your skin gets clammy.  You feel nauseous or you vomit.  You suddenly feel light-headed or dizzy.  Your heart begins to beat quickly, or it feels like it is skipping beats. These symptoms may represent a serious problem that is an emergency. Do not wait to see if the symptoms will go away. Get medical help right away. Call your local emergency services (911 in the [place] Do not drive yourself to the hospital.   This  information is not intended to replace advice given to you by your health care provider. Make sure you discuss any questions you have with your health care provider.   Document Released: 03/12/2005 Document Revised: 06/23/2014 Document Reviewed: 01/06/2014 Elsevier Interactive Patient Education 2016 [place] Headache Without Cause A headache is pain or discomfort felt around the head or neck area. The specific cause of a headache may not be found. There are many causes and types of headaches. A few common ones are:  Tension headaches.  Migraine headaches.  Cluster headaches.  Chronic daily headaches. HOME CARE INSTRUCTIONS  Watch your condition for any changes. Take these steps to help with your condition: Managing Pain  Take over-the-counter and prescription medicines only as told by your health care provider.  Lie down in a dark, quiet room when you have a headache.  If directed, apply ice to the head and neck area:  Put ice in a plastic bag.  Place a towel between your skin and the bag.  Leave the ice on for 20 minutes, 2-3 times per day.  Use a heating pad or hot shower to apply heat to the head and neck area as told  by your health care provider.  Keep lights dim if bright lights bother you or make your headaches worse. Eating and Drinking  Eat meals on a regular schedule.  Limit alcohol use.  Decrease the amount of caffeine you drink, or stop drinking caffeine. General Instructions  Keep all follow-up visits as told by your health care provider. This is important.  Keep a headache journal to help find out what may trigger your headaches. For example, write down:  What you eat and drink.  How much sleep you get.  Any change to your diet or medicines.  Try massage or other relaxation techniques.  Limit stress.  Sit up straight, and do not tense your muscles.  Do not use tobacco products, including cigarettes, chewing tobacco, or  e-cigarettes. If you need help quitting, ask your health care provider.  Exercise regularly as told by your health care provider.  Sleep on a regular schedule. Get 7-9 hours of sleep, or the amount recommended by your health care provider. SEEK MEDICAL CARE IF:   Your symptoms are not helped by medicine.  You have a headache that is different from the usual headache.  You have nausea or you vomit.  You have a fever. SEEK IMMEDIATE MEDICAL CARE IF:   Your headache becomes severe.  You have repeated vomiting.  You have a stiff neck.  You have a loss of vision.  You have problems with speech.  You have pain in the eye or ear.  You have muscular weakness or loss of muscle control.  You lose your balance or have trouble walking.  You feel faint or pass out.  You have confusion.   This information is not intended to replace advice given to you by your health care provider. Make sure you discuss any questions you have with your health care provider.   Document Released: 06/02/2005 Document Revised: 02/21/2015 Document Reviewed: 09/25/2014 Elsevier Interactive Patient Education 2016 Elsevier Inc.  Pantoprazole tablets What is this medicine? PANTOPRAZOLE (pan TOE pra zole) prevents the production of acid in the stomach. It is used to treat gastroesophageal reflux disease (GERD), inflammation of the esophagus, and Zollinger-Ellison syndrome. This medicine may be used for other purposes; ask your health care provider or pharmacist if you have questions. What should I tell my health care provider before I take this medicine? They need to know if you have any of these conditions: -liver disease -low levels of magnesium in the blood -an unusual or allergic reaction to omeprazole, lansoprazole, pantoprazole, rabeprazole, other medicines, foods, dyes, or preservatives -pregnant or trying to get pregnant -breast-feeding How should I use this medicine? Take this medicine by mouth.  Swallow the tablets whole with a drink of water. Follow the directions on the prescription label. Do not crush, break, or chew. Take your medicine at regular intervals. Do not take your medicine more often than directed. Talk to your pediatrician regarding the use of this medicine in children. While this drug may be prescribed for children as young as 5 years for selected conditions, precautions do apply. Overdosage: If you think you have taken too much of this medicine contact a poison control center or emergency room at once. NOTE: This medicine is only for you. Do not share this medicine with others. What if I miss a dose? If you miss a dose, take it as soon as you can. If it is almost time for your next dose, take only that dose. Do not take double or extra doses. What may  interact with this medicine? Do not take this medicine with any of the following medications: -atazanavir -nelfinavir This medicine may also interact with the following medications: -ampicillin -delavirdine -erlotinib -iron salts -medicines for fungal infections like ketoconazole, itraconazole and voriconazole -methotrexate -mycophenolate mofetil -warfarin This list may not describe all possible interactions. Give your health care provider a list of all the medicines, herbs, non-prescription drugs, or dietary supplements you use. Also tell them if you smoke, drink alcohol, or use illegal drugs. Some items may interact with your medicine. What should I watch for while using this medicine? It can take several days before your stomach pain gets better. Check with your doctor or health care professional if your condition does not start to get better, or if it gets worse. You may need blood work done while you are taking this medicine. What side effects may I notice from receiving this medicine? Side effects that you should report to your doctor or health care professional as soon as possible: -allergic reactions like skin  rash, itching or hives, swelling of the face, lips, or tongue -bone, muscle or joint pain -breathing problems -chest pain or chest tightness -dark yellow or brown urine -dizziness -fast, irregular heartbeat -feeling faint or lightheaded -fever or sore throat -muscle spasm -palpitations -redness, blistering, peeling or loosening of the skin, including inside the mouth -seizures -tremors -unusual bleeding or bruising -unusually weak or tired -yellowing of the eyes or skin Side effects that usually do not require medical attention (Report these to your doctor or health care professional if they continue or are bothersome.): -constipation -diarrhea -dry mouth -headache -nausea This list may not describe all possible side effects. Call your doctor for medical advice about side effects. You may report side effects to FDA at 1-800-FDA-1088. Where should I keep my medicine? Keep out of the reach of children. Store at room temperature between 15 and 30 degrees C (59 and 86 degrees F). Protect from light and moisture. Throw away any unused medicine after the expiration date. NOTE: This sheet is a summary. It may not cover all possible information. If you have questions about this medicine, talk to your doctor, pharmacist, or health care provider.    2016, Elsevier/Gold Standard. (2014-07-21 14:45:56)

## 2015-04-29 NOTE — ED Notes (Signed)
Pt laying flat, when asked to rate her pain, states 6/10 on ly in head and neck. Eyes closed, speaking quietly, no apparent distress.

## 2015-04-29 NOTE — ED Provider Notes (Signed)
CSN: [id number]     Arrival date & time 04/29/15  [id number] History   First MD Initiated Contact with Patient 04/29/15 0537     Chief Complaint  Patient presents with  . Chest Pain     (Consider location/radiation/quality/duration/timing/severity/associated sxs/prior Treatment) Patient is a 49 y.o. female presenting with chest pain. The history is provided by the patient.  Chest Pain She had some epigastric burning when she went to bed last night. This morning, she had a heavy, tight pain in the lower sternal area radiating up to the neck and jaw with associated weakness. She denies dyspnea, nausea, diaphoresis. Nothing made symptoms better or worse. She took acetaminophen and nitroglycerin without relief. She had been evaluated for chest pain about 2 years ago but states that this pain is different. Her only significant risk factor is hyperlipidemia. She has no history of diabetes or hypertension and is a nonsmoker.  Past Medical History  Diagnosis Date  . Elevated cholesterol     dx in early 29s  . Mitral valve prolapse     during adolescence  . Bundle branch block   . Shortness of breath   . Hypothyroidism   . Bipolar disorder ([place])   . Headache(784.0)   . Fibroid   . Endometriosis   . Pancreatitis   . Ovarian torsion   . Female bladder prolapse    Past Surgical History  Procedure Laterality Date  . Appendectomy    . Cholecystectomy    . Abdominal hysterectomy    . Hernia repair    . Colonoscopy    . Dilation and curettage of uterus    . Esophagogastroduodenoscopy (egd) with propofol N/A 09/09/2013    Procedure: ESOPHAGOGASTRODUODENOSCOPY (EGD) WITH PROPOFOL;  Surgeon: Arta Silence, MD;  Location: [facility];  Service: Endoscopy;  Laterality: N/A;  . Eus N/A 09/09/2013    Procedure: ESOPHAGEAL ENDOSCOPIC ULTRASOUND (EUS) RADIAL;  Surgeon: Arta Silence, MD;  Location: [facility] ENDOSCOPY;  Service: Endoscopy;  Laterality: N/A;  . Oophorectomy    . Laparoscopy Left 11/15/2013     Procedure: LAPAROSCOPY OPERATIVE WITH LEFT SALPINGO-OOPHORECTOMY/LYSIS OF ADHESIONS/REMOVAL OF LEFT OVARIAN CYST;  Surgeon: Betsy Coder, MD;  Location: [facility];  Service: Gynecology;  Laterality: Left;   Family History  Problem Relation Age of Onset  . Thyroid disease Mother   . Heart disease Mother   . Diabetes Mother   . Asthma Father   . Stroke Father   . Cancer - Colon Other    Social History  Substance Use Topics  . Smoking status: Former Smoker    Types: Cigarettes  . Smokeless tobacco: Never Used  . Alcohol Use: Yes     Comment: couple drinks a week   OB History    Gravida Para Term Preterm AB TAB SAB Ectopic Multiple Living   2 2 2       2      Review of Systems  Cardiovascular: Positive for chest pain.  All other systems reviewed and are negative.     Allergies  Vancomycin and Penicillins  Home Medications   Prior to Admission medications   Medication Sig Start Date End Date Taking? Authorizing Provider  aspirin 81 MG tablet Take 81 mg by mouth at bedtime.     Historical Provider, MD  b complex vitamins tablet Take 1 tablet by mouth daily.    Historical Provider, MD  BIOTIN PO Take 1 tablet by mouth daily.    Historical Provider, MD  Cholecalciferol (VITAMIN D3) 2000  UNITS TABS Take 1 tablet by mouth daily.    Historical Provider, MD  CINNAMON PO Take 1,000 mg by mouth daily.     Historical Provider, MD  ibuprofen (ADVIL,MOTRIN) 600 MG tablet 1  po pc every 6 hours x 3 days then as needed for pain 11/15/13   Earnstine Regal, PA-C  lamoTRIgine (LAMICTAL) 200 MG tablet Take 200 mg by mouth at bedtime.     Historical Provider, MD  levothyroxine (SYNTHROID, LEVOTHROID) 88 MCG tablet Take 88 mcg by mouth daily before breakfast.    Historical Provider, MD  MAGNESIUM PO Take 1 tablet by mouth daily.    Historical Provider, MD  Misc Natural Products (APPLE CIDER VINEGAR) TABS Take 2 tablets by mouth daily.     Historical Provider, MD  Multiple Vitamins-Minerals  (MULTIVITAMIN WITH MINERALS) tablet Take 1 tablet by mouth daily.    Historical Provider, MD  NIACIN PO Take 500 mg by mouth daily.     Historical Provider, MD  nitroGLYCERIN (NITROSTAT) 0.4 MG SL tablet Place 1 tablet (0.4 mg total) under the tongue every 5 (five) minutes x 3 doses as needed for chest pain. 09/06/13   Eileen Stanford, PA-C  ondansetron (ZOFRAN) 4 MG tablet Take 1 tablet (4 mg total) by mouth every 6 (six) hours. 06/15/14   Margaretann Loveless, MD  oxyCODONE-acetaminophen (ROXICET) 5-325 MG per tablet Take 1 tablet by mouth every 6 (six) hours as needed for moderate pain or severe pain. 11/15/13   Elmira Powell, PA-C  Pancrelipase, Lip-Prot-Amyl, (CREON PO) Take by mouth 2 (two) times daily before a meal.    Historical Provider, MD  pantoprazole (PROTONIX) 40 MG tablet Take 1 tablet (40 mg total) by mouth daily. 06/15/14   Margaretann Loveless, MD  Probiotic Product (PROBIOTIC DAILY PO) Take 1 capsule by mouth daily.    Historical Provider, MD  venlafaxine XR (EFFEXOR-XR) 75 MG 24 hr capsule Take 75 mg by mouth daily with breakfast.    Historical Provider, MD   BP 132/82 mmHg  Pulse 78  Temp(Src) 98.6 F (37 C) (Oral)  Resp 19  SpO2 100% Physical Exam  Nursing note and vitals reviewed.  49 year old female, resting comfortably and in no acute distress. Vital signs are normal. Oxygen saturation is 100%, which is normal. Head is normocephalic and atraumatic. PERRLA, EOMI. Oropharynx is clear. Neck is nontender and supple without adenopathy or JVD. Back is nontender and there is no CVA tenderness. Lungs are clear without rales, wheezes, or rhonchi. Chest is nontender. Heart has regular rate and rhythm without murmur. Abdomen is soft, flat, with moderate epigastric tenderness. There is no rebound or guarding. There are no masses or hepatosplenomegaly and peristalsis is normoactive. Extremities have no cyanosis or edema, full range of motion is present. Skin is warm and dry without  rash. Neurologic: Mental status is normal, cranial nerves are intact, there are no motor or sensory deficits.  ED Course  Procedures (including critical care time) Labs Review Results for orders placed or performed during the hospital encounter of 04/29/15  Comprehensive metabolic panel  Result Value Ref Range   Sodium 139 135 - 145 mmol/L   Potassium 4.0 3.5 - 5.1 mmol/L   Chloride 103 101 - 111 mmol/L   CO2 28 22 - 32 mmol/L   Glucose, Bld 102 (H) 65 - 99 mg/dL   BUN 15 6 - 20 mg/dL   Creatinine, Ser 0.90 0.44 - 1.00 mg/dL   Calcium 9.6 8.9 - 10.3 mg/dL  Total Protein 6.7 6.5 - 8.1 g/dL   Albumin 4.3 3.5 - 5.0 g/dL   AST 32 15 - 41 U/L   ALT 37 14 - 54 U/L   Alkaline Phosphatase 84 38 - 126 U/L   Total Bilirubin 0.5 0.3 - 1.2 mg/dL   GFR calc non Af Amer >60 >60 mL/min   GFR calc Af Amer >60 >60 mL/min   Anion gap 8 5 - 15  Lipase, blood  Result Value Ref Range   Lipase 50 11 - 51 U/L  CBC with Differential  Result Value Ref Range   WBC 9.3 4.0 - 10.5 K/uL   RBC 4.96 3.87 - 5.11 MIL/uL   Hemoglobin 14.3 12.0 - 15.0 g/dL   HCT 42.4 36.0 - 46.0 %   MCV 85.5 78.0 - 100.0 fL   MCH 28.8 26.0 - 34.0 pg   MCHC 33.7 30.0 - 36.0 g/dL   RDW 12.6 11.5 - 15.5 %   Platelets 235 150 - 400 K/uL   Neutrophils Relative % 56 %   Neutro Abs 5.2 1.7 - 7.7 K/uL   Lymphocytes Relative 33 %   Lymphs Abs 3.0 0.7 - 4.0 K/uL   Monocytes Relative 7 %   Monocytes Absolute 0.7 0.1 - 1.0 K/uL   Eosinophils Relative 4 %   Eosinophils Absolute 0.3 0.0 - 0.7 K/uL   Basophils Relative 0 %   Basophils Absolute 0.0 0.0 - 0.1 K/uL  Troponin I  Result Value Ref Range   Troponin I <0.03 <0.031 ng/mL   I have personally reviewed and evaluated these images and lab results as part of my medical decision-making.   EKG Interpretation   Date/Time:  Sunday April 29 2015 05:29:10 EST Ventricular Rate:  80 PR Interval:  184 QRS Duration: 156 QT Interval:  473 QTC Calculation: 546 R Axis:    79 Text Interpretation:  Sinus rhythm Right bundle branch block When compared  with ECG of 09/05/2013, No significant change was found Confirmed by [facility]   MD, Tiyonna Sardinha ([id number]) on 04/29/2015 5:37:22 AM      MDM   Final diagnoses:  Atypical chest pain  Muscle contraction headache    Chest discomfort which seems most likely to be GI in our urgent. Old records are reviewed and she had been admitted for chest pain in 2014 and had negative stress test as well as negative upper endoscopy. She'll be given a therapeutic trial of a GI cocktail.  She initially stated no relief with GI cocktail. She was then complaining of headache and a tight feeling in her neck. She was given a headache cocktail of IV fluids, metoclopramide, and diphenhydramine. Following this, she felt much better. All symptoms had resolved. Laboratory workup wasunremarkable. Her husband's main complaint was that she was so weak she couldn't walk. Patient will be ambulated in the ED and discharged if able to ambulate successfully. She will be discharged with prescription for pantoprazole.  Patient ambulated to the bathroom without difficulty and is discharged.  Delora Fuel, MD [id number]

## 2015-04-30 MED ORDER — METHYLPREDNISOLONE 4 MG PO TBPK
4 MG | PACK | ORAL | 0 refills | Status: AC
Start: 2015-04-30 — End: 2015-05-06

## 2015-04-30 NOTE — Progress Notes (Signed)
Chief Complaint   Patient presents with   ??? Lower Back Pain     Lumbar spine MRI follow up.        Lauren Hull  Is a 49 y.o.  Caucasian female who presents today complaining of left hip pain. Pt. States that the pain began 3  year(s) ago. Patient did not have a history of injury.  Patients states pain is located lateral, over greater trochanter and has tenderness over the  buttock portion of the hip. Hip pain is described as aching and  is aggravated by walking and is worse after period of inactivity along with diffuse and lateral discomfort. Patient states the pain occurs in the  continuous. Patient states hip pain is relieved by rest. Patient does not have a history of back pain.  The patient does not use ambulatory aid.  The patients occupation is at [employer].  There is an occasional "burning" down the left lower extremity    Past Medical History   Diagnosis Date   ??? Anxiety      No past surgical history on file.    Current Outpatient Prescriptions:   ???  methylPREDNISolone (MEDROL, PAK,) 4 MG tablet, Take 1 tablet by mouth See Admin Instructions for 6 days Take by mouth., Disp: 1 kit, Rfl: 0  ???  sertraline (ZOLOFT) 50 MG tablet, take 1 tablet by mouth once daily, Disp: , Rfl: 0  Allergies   Allergen Reactions   ??? Ibuprofen      Social History     Social History   ??? Marital status: Single     Spouse name: N/A   ??? Number of children: N/A   ??? Years of education: N/A     Occupational History   ??? Not on file.     Social History Main Topics   ??? Smoking status: Current Every Day Smoker     Packs/day: 0.50   ??? Smokeless tobacco: Not on file   ??? Alcohol use No   ??? Drug use: Not on file   ??? Sexual activity: Not on file     Other Topics Concern   ??? Not on file     Social History Narrative   ??? No narrative on file     No family history on file.      REVIEW OF SYSTEMS:     General/Constitution:  (-)weight loss, (-)fever, (-)chills, (-)weakness.   Skin: (-) rash,(-) psoriasis,(-) eczema, (-)skin cancer.   Musculoskeletal:  (-) fractures,  (-) dislocations,(-) collagen vascular disease, (-) fibromyalgia, (-) multiple sclerosis, (-) muscular dystrophy, (-) RSD,(-) joint pain (-)swelling, (-) joint pain,swelling.  Neurologic: (-) epilepsy, (-)seizures,(-) brain tumor,(-) TIA, (-)stroke, (-)headaches, (-)Parkinson disease,(-) memory loss, (-) LOC.  Cardiovascular: (-) Chest pain, (-) swelling in legs/feet, (-) SOB, (-) cramping in legs/feet with walking.  Respiratory: (-) SOB, (-) Coughing, (-) night sweats.  GI: (-) nausea, (-) vomiting, (-) diarrhea, (-) blood in stool, (-) gastric ulcer.  Psychiatric: (-) Depression, (-) Anxiety, (-) bipolar disease, (-) Alzheimer's Disease  Allergic/Immunologic: (-) allergies latex, (-) allergies metal, (-) skin sensitivity.  Hematlogic: (-) anemia, (-) blood transfusion, (-) DVT/PE, (-) Clotting disorders      Subjective:      Constitution:    Ht. 5 ft 7 in. , Wt. 208 lbs.    Psycihatric:    The patient is alert and oriented x 3, appears to be stated age and in no distress.      Respiratory:    Respiratory effort  is not labored.  Patient is not gasping.  Palpation of the chest reveals no tactile fremitus.    Skin:    Upon inspection: the skin appears warm, dry and intact.  There is not a previous scar over the affected area.There is not any cellulitis, lymphedema or cutaneous lesions noted in the lower extremities.   Upon palpation there is no induration noted.          Neurologic:      Motor exam of the lower extremities show ; quadriceps, hamstrings, foot dorsi and plantar flexors intact R.  5/5 and L. 5/5. Deep tendon reflexes are 2/4 at the knees and 2/4 at the ankles with strong extensor hallicus longus motor strength bilaterally. Sensory to both feet is intact to all sensory roots.    Cardiovascular:    The vascular exam is normal and is well perfused to distal extremities.  Distal pulses DP/PT: R. 2+; L. 2+.  There is cap refill noted less than two seconds in all digits. There is not edema of  the bilateral lower extremities.  There is not varicosities noted in the distal extremities.      Lymph:    Upon palpation,  there is no lymphadenopathy noted in bilateral lower extremities.          Musculoskeletal:  Gait: antalgic;  Examination of the digits and nails reveal no cyanosis or clubbing        Lumbar exam:    On visual inspection, there is deformity of the spine.   limited range of motion, pain with motion noted during exam. Special tests: Straight Leg Raise positive, Faber test positive.      Hip exam:    Upon visual inspection there is not a deformity noted. Patient complains of tenderness at the  greater trochanter and buttock.     Exam of the left hip shows none leg length discrepancy.  Range of motion of the involved/uninvolved hip is 25 degrees internal rotation and 35 degrees external rotation/25 degrees internal rotation and 35 degrees external rotation, the hip joint is stable to testing. Strength of the lower extremity is equal bilaterally.The patient does not have ipsilateral knee pain, and is described as  diffuse.  Hip exam- Gait: normal; Strength: Hip Flexors 5/5; Hip Abductors 4/5; Hip Adduction 4/5.       Knee exam :    Upon visual inspection there is not deformity noted.  The patient does not have  pain on motion, there is not tenderness over the  global region.  Range of motion of R. Knee is 0 to 120, and L. Knee is 0 to 120.  there are not any masses, there is not ligamentous instability, there is not  deformity noted.        Xrays:  Films from outside facility are normal    Radiographic findings reviewed with patient  MRI  Impression:  Mild degenerative disc disease in the lower lumbar spine, greatest at   L4-5. No compressive disc herniations identified..       Encounter Diagnoses   Name Primary?   ??? DDD (degenerative disc disease), lumbar Yes   ??? Lumbar spinal stenosis        Plan:  Natural history and expected course discussed. Questions answered.  Scientist, clinical (histocompatibility and immunogenetics)  distributed.   I would recommend conservative measures including HEP and lumbar epidurals.  I will refer her for an evaluation for this  PT  Follow up as needed

## 2015-05-22 ENCOUNTER — Institutional Professional Consult (permissible substitution)
Admit: 2015-05-22 | Discharge: 2015-05-22 | Payer: PRIVATE HEALTH INSURANCE | Attending: Physical Medicine & Rehabilitation | Primary: Family Medicine

## 2015-05-22 DIAGNOSIS — M5442 Lumbago with sciatica, left side: Secondary | ICD-10-CM

## 2015-05-22 NOTE — Patient Instructions (Signed)
We appreciate that you chose [facility] to provide your healthcare needs today.  We took great pleasure in caring for you.  You may receive a survey to help us learn how to make your next visit to a [facility] facility better than the last.   Your feedback is important to help us continually improve the service we can provide you. Please take the time to complete it thoughtfully if you receive one as we value the feedback in every survey.   In this survey we would appreciate that you answer "always" or "yes" for as many questions as possible (this is the answer we get credit for doing a good job).  If you feel there is a question you cannot answer "always" or "yes", please let us know before you leave today so we can remedy the situation right away. We look forward to continuing to provide you with excellent care. Considering the survey questions related to access to care, please keep in mind the urgency of your problem (i.e. was it an emergent or urgent visit or more routine)  and whether the urgency of that problem was handled appropriately by our office. We always do our best to prioritize the patients who need the care most urgently given our specialty is in short supply and there is a high demand for visits.  Thank you for your time and consideration.

## 2015-05-22 NOTE — Progress Notes (Signed)
Lauren Hull, D.O., P.T.  [facility]  [address], [address]  [place], [place] [address]  Phone: [phone]  Fax: [phone]    PCP: Lauren Laming Bufford Spikes, DO  Date of visit: 05/22/15    Chief Complaint   Patient presents with   ??? New Patient     CONSULT DDD lumbar spinal stenosis   ??? Back Pain       Dear Dr. Joseph Art,    As you know,  Lauren Hull is a 49 y.o.  right hand dominant woman with gradual onset of low back pain after no known injury several years ago. Now, the pain is constant and occurs daily.  The pain is rated Pain Score:   4, is described as burning, and is located in the low back with radiation to the left hip hip and thigh. The symptoms have been worse since onset.  The pain is better with steroid.  The pain is worse with chiropractic, PT.  The prior workup has included: MRI, reviewed showed L3-4 and L4-5 bulging disc with foraminal stenosis at L4-5 and central stenosis at L3-4  The prior treatment has included Tylenol arthritis, PT, chiropractic.     Past Medical History   Diagnosis Date   ??? Anxiety        Past Surgical History   Procedure Laterality Date   ??? Vein surgery     ??? Tubal ligation     ??? Ectopic pregnancy surgery  1986       Social History     Social History   ??? Marital status: Single     Spouse name: N/A   ??? Number of children: N/A   ??? Years of education: N/A     Occupational History   ??? Not on file.     Social History Main Topics   ??? Smoking status: Current Every Day Smoker     Packs/day: 0.50   ??? Smokeless tobacco: Not on file   ??? Alcohol use No   ??? Drug use: Not on file   ??? Sexual activity: Not on file     Other Topics Concern   ??? Not on file     Social History Narrative     The patient works at [employer]. Is independent in ADL's.    No family history on file.    Allergies   Allergen Reactions   ??? Ibuprofen        Current Outpatient Prescriptions   Medication Sig Dispense Refill   ??? acetaminophen (TYLENOL) 325 MG tablet Take  650 mg by mouth every 6 hours as needed for Pain     ??? sertraline (ZOLOFT) 50 MG tablet take 1 tablet by mouth once daily  0     No current facility-administered medications for this visit.        Review of Systems - For review of systems, positive symptoms are underlined and negative findings are not underlined.  General: chills, fatigue, fever, malaise, night sweats, weight gain,  weight loss. Psychological: anxiety, depression, suicidal ideation, sleep disturbances, behavioral disorder, difficulty concentrating, disorientation, hallucinations, mood swings, obsessive thoughts, physical abuse,  sexual abuse. Ophthalmic: blurry vision, decreased vision, double vision, loss of vision, photophobia, use of corrective device. Ear Nose Throat: hearing loss, tinnitus, phonophobia, sensitivity to smells, vertigo, or vocal changes.Allergy/Immunology: seasonal allergies, watery eyes, itchy eyes, frequent infections.  Hematological and Lymphatic: bleeding problems, blood clots, bruising,  yellowing of the skin, swollen lymph nodes.  Endocrine:  polydypsia, polyuria, temperature intolerance. Respiratory: cough, shortness of breath, wheezing. Cardiovascular: syncope, chest pain, dyspnea on exertion, edema, irregular heartbeat,  palpitations. Gastrointestinal: abdominal pain, constipation, diarrhea,  decreased appetite, heartburn, hematemesis, melena, nausea, vomiting, stool incontinence, abnormal swallowing.Genito-Urinary: dysuria, hematuria, incontinence, frequency, urgency. Musculoskeletal: joint pain, stiffness, swelling, muscle pain, muscle  tenderness. Neurological: confusion, memory loss, dizziness, gait disturbance, headaches, impaired coordination, decreased balance, numbness/tingling, seizures, speech problems, tremors,weakness.  Dermatological:  hair changes, nail changes, pruritus, rash.      Physical Exam: Blood pressure 119/63, pulse 62, height 5\' 7"  (1.702 m), weight 223 lb (101.2 kg). General: The patient is in no  apparent distress. Body habitus is obese. HEENT: No rhinorrhea, sneezing, yawning, or lacrimation. No scleral icterus or conjunctival injection. SKIN: No piloerection. No track marks. No rash. Normal turgor. No erythema or ecchymosis. Psychological: Mood and affect are appropriate. Hygiene is appropriate. Cardiovascular:  Heart is regular rate and rhythm.  Peripheral pulses are 2+ at the dorsalis pedis, posterior tibial and radial arteries.  There is no edema. Respiratory: Respirations are regular and unlabored.  There is no cyanosis. Lymphatic: There is no cervical or inguinal lymphadenopathy.  Gastrointestinal: Soft abdomen, non-tender. No pulsating abdominal mass. Genitourinary: No costovertebral angle tenderness. MSK: Lumbar:  Cervical lordosis normal,  thoracic kyphosis normal and lumbar lordosis reduced.No hairy patch, cafe au lait, nevi, hemangioma or dimpling over lumbar area.Pelvis level, no scoliosis, leg length equal. Seated and standing flexion tests negative. There is no step off deformity.No superficial or bony tenderness. Lumbar AROM in flexion is 90 degrees, in extension is 30 degrees, in left rotation is 30degrees, in right rotation is 30 degrees, in left lateral flexion is 30 degrees and in right lateral flexion is 30 degrees.  There is tenderness to palpation at bilateral lumbosacral paraspinals.  No tenderness to palpation at  bilateral SIJ,  gluteal muscles,  pubic tubercle,  PSIS,  ischial tuberosity,  greater trochanter,  ASIS,  iliac crest.  No trigger points. No spasm.  No edema, erythema, ecchymosis,  mass or deformity.Taut bands absent.   Popliteal angle is normal .  Seated straight leg raise positive on left.  SIJ: Gillet negative bilaterally.  FABER negative bilaterally. ASIS compression test negative bilaterally.. Hip: Full painless AROM bilaterally. Patrick negative bilaterally. Trendelenburg negative bilaterally.  Neurologic: Awake, alert and oriented in three planes.  Speech is  fluent.  No facial weakness.  Tongue is midline.  Hearing is intact for conversation.  Pupils are equal and round.  Extraocular muscles are intact.  Hearing is intact for conversation. Shoulder shrug symmetric. Sensation: Intact for light touch and pin prick in all upper and lower extremity dermatomes. Vibration and proprioception are intact at the bilateral first MTP.  Strength: 5/5 in all myotomes in the upper and lower extremities.  Muscle Tendon Reflexes: 2+ symmetric in the bilateral upper and lower extremities. Babinski is downgoing bilaterally. Mikey BussingHoffman is negative bilaterally. Gait is Normal.   Romberg is negative.  Heel and toe walk are normal.  Tandem walk is normal.  No clonus or spasticity.  The patient was able to rise from a chair and squat without difficulty. There is no tremor.     Impression:   1. Chronic bilateral low back pain with left-sided sciatica    2. Lumbar spinal stenosis    3. DDD (degenerative disc disease), lumbar    4. Facet arthropathy, lumbar (HCC)        Plan:   Orders Placed This Encounter   Procedures   ???  Ambulatory epidural steroid injection     Scheduling Instructions:      Left L4 Transforaminal Epidural steroid injection by Dr.Masternick-Black.        Dr. Adin Hector will update H&P if I haven't seen them in the last 30 days at the time of the injection.     The patient was educated about the diagnosis, prognosis, indications, risks and benefits of treatment. An opportunity to ask questions was given to the patient and questions were answered.  The patient agreed to proceed with the recommended treatment as described above.   Follow up after ESI     Thank you for the consultation and for allowing me to participate in the care of this patient.      Sincerely,         Lauren Hull, D.O., P.T.  Board Certified Physical Medicine and Rehabilitation  Board Certified Electrodiagnostic Medicine

## 2015-06-13 ENCOUNTER — Encounter

## 2015-06-13 MED ORDER — METHYLPREDNISOLONE 4 MG PO TBPK
4 MG | PACK | ORAL | 0 refills | Status: AC
Start: 2015-06-13 — End: 2015-06-19

## 2015-06-20 NOTE — Telephone Encounter (Signed)
Patient called in.  She has had a change in her insurance, and is now active with [employer]..  She did not have policy information on hand at time of call.  She stated she would fax over a copy of new card.  She will need a new PA submitted for the requested Left L4 Transforaminal Epidural steroid injection by Dr.Masternick-Black.

## 2015-06-21 NOTE — Telephone Encounter (Signed)
Left message on patient voicemail requesting a return call with new policy information.

## 2015-06-28 ENCOUNTER — Inpatient Hospital Stay: Attending: Interventional Pain Medicine | Primary: Family Medicine

## 2015-06-28 DIAGNOSIS — M5416 Radiculopathy, lumbar region: Secondary | ICD-10-CM

## 2015-06-28 NOTE — Progress Notes (Signed)
HISTORY OF PRESENT ILLNESS:   Lauren Hull presents today, 06/28/2015, to [facility] with complaints of pain in her low back, left hip which is described as aching, burning and dull.  She reports that the pain has been present for 4 years.   Lauren Hull rates the pain as a 10/10 on her worst day and a 2/10 on her best day, on the VAS scale.  Lauren Hull has  burning which does radiate to left leg.  She states the pain began following No specific cause. Alleviating factors include: rest.  Aggravating factors include:  cold, walking, standing, lying down.  She states her pain is constant.  Lauren Hull states that the pain does keep her from sleeping at night.      PREVIOUS THERAPIES:  She has not had Epidural Injections.  She has tried Physical Therapy .  She has had Chiropractic Care.  Medications which have helped her pain include:  Prednisone, tylenol arthritis.  Medications which have not helped her pain include:  aleve.  She is currently prescribed prednisone by Dr. Joseph ArtWoods.   She took her last dose of pain medication yesterday.  She has not been on anticoagulation medications .   She is not under psychiatric care.      height is 5\' 7"  (1.702 m) and weight is 208 lb (94.3 kg). Her temperature is 98.1 ??F (36.7 ??C). Her blood pressure is 130/73 and her pulse is 79. Her respiration is 20.     Please see EMR (Media) for Quality of Life, Zung Depression and Falls Risk Assessment scores.

## 2015-06-28 NOTE — Progress Notes (Signed)
CONSULTATION   Date:  Lauren Hull  Patient: Lauren Hull  DOB: 04-11-1966 Age: 50 y.o.  Sex: female  REFERRING PHYSICIAN:  [name], Lauren Hull, Lauren Hull  PCP:  Lauren Hull, Lauren Hull  Reason for Referral:  Lumbar Spinal Stenosis    HISTORY OF PRESENT ILLNESS:   Lauren Hull presents Hull, Lauren Hull, Lauren Hull [name]. [name] with complaints of pain in her low back, left hip which is described as aching, burning and dull. She reports that [name] has been present for 4 years. Lauren Hull rates [name] as a 10/10 on her worst day and a 2/10 on her best day, on Hull VAS scale. Lauren Hull has burning which does radiate Lauren left leg. She states [name] began following No specific cause. Alleviating factors include: rest. Aggravating factors include: cold, walking, standing, lying down. She states her pain is constant. Lauren Hull states that [name] does keep her from sleeping at night.   ??  PREVIOUS THERAPIES: She has not had Epidural Injections. She has tried Physical Therapy . She has had Chiropractic Care. Medications which have helped her pain include: Prednisone, tylenol arthritis. Medications which have not helped her pain include: aleve. She is currently prescribed prednisone by Lauren Hull. She took her last dose of pain medication yesterday. She has not been on anticoagulation medications . She is not under psychiatric care.     06/20/2015 per Lauren KlinefelterSonya Magallon, Lauren Hull:  Patient called in.  She has had a change in her insurance, and is now active with [employer]..  She did not have policy information on hand at time of call.  She stated she would fax over a copy of new card.  She will need a new PA submitted for Hull requested Left L4 Transforaminal Epidural steroid injection by Lauren Hull.    Lauren Hull was evaluated and treated on 05/22/2015 by Lauren Hull  Plan: ??  ?? Left L4 Transforaminal Epidural steroid injection by Lauren Hull.   ?? Hull patient was educated about Hull  diagnosis, prognosis, indications, risks and benefits of treatment. An opportunity Lauren ask questions was given Lauren Hull patient and questions were answered. Hull patient agreed Lauren proceed with Hull recommended treatment as described above.   ?? [name]     Diagnostic studies were reviewed in detail with Lauren Hull which included:    ?? MRI of Hull Lumbar Spine [facility] 04/23/2015 [facility]  (Please see EMR for complete report)    VITAL SIGNS:  Her  height is 5\' 7"  (1.702 m) and weight is 208 lb (94.3 kg). Her temperature is 98.1 ??F (36.7 ??C). Her blood pressure is 130/73 and her pulse is 79. Her respiration is 20.     PERTINENT FAMILY HISTORY:    Her family history is not on file.    PAST SURGICAL HISTORY:  Lauren Hull  has a past surgical history that includes Vein Surgery; Tubal ligation; and Ectopic pregnancy surgery (1986).     MEDICATIONS:    Current Outpatient Prescriptions:   ???  cefdinir (OMNICEF) 300 MG capsule, Take 300 mg by mouth 2 times daily, Disp: , Rfl:   ???  fluticasone (FLONASE) 50 MCG/ACT nasal spray, 1 spray by Nasal route daily, Disp: , Rfl:   ???  predniSONE (DELTASONE) 1 MG tablet, Take 4 mg by mouth daily, Disp: , Rfl:   ???  sertraline (ZOLOFT) 50 MG tablet, take 1 tablet by mouth once daily, Disp: , Rfl: 0  ???  acetaminophen (  TYLENOL) 325 MG tablet, Take 650 mg by mouth every 6 hours as needed for Pain, Disp: , Rfl:     ALLERGIES:  [name] is allergic Lauren ibuprofen.    SOCIAL HISTORY:  Demeisha  reports that she has been smoking.  She has been smoking about 0.50 packs per day. She does not have any smokeless tobacco history on file. She reports that she does not drink alcohol or use illicit drugs.    PAST MEDICAL HISTORY:  Shifra  has a past medical history of Anxiety; Arthritis; and Bronchitis.    REVIEW OF SYSTEMS:   General/ConstitutionaI:  Patient denies shortness of breath, fevers/chills, chest pain, new bowel or bladder issues/complaints, or suicidal ideations.  All other review  of systems negative.    PHYSICAL EXAMINATION:    General Descriptors:  Well appearing, well nourished, in no acute distress.      PSYCH./MENTAL STATUS:  AFFECT: appropriate.  SPEECH: language was linear and logical.  Nesha is awake and alert.  Displays appropriate mood and affect.  Denies suicidal ideation or intent.      HEENT:  Head is normocephalic, atraumatic. Eyes: Extraocular muscles are intact.  Pupils are equal.  Cranial nerves 2-12 grossly intact. Hearing is grossly intact. Oral mucosal membranes are moist.Head is normocephalic, atraumatic.        GAIT/GROSS MOTOR STATIONS:  Naomia has normal gait and ambulates slowly without Hull assistance of any device.           LUNGS:  Normal respiratory efforts. Clear Lauren auscultation bilaterally. No accessory muscle use noted.      CARDIOVASCULAR:  Bilateral radial pulses intact and regular.    PERIPHERAL VASCULAR:  No cyanosis or edema in bilateral lower extremities.    ABDOMEN:  Soft, nontender.    INTEGUMENT: Appearance of exposed skin is unremarkable.    NEUROLOGICAL:  Sensation is intact Lauren light touch in Hull upper and lower extremities bilaterally. Hoffmann's is negative bilaterally. Coordination intact.  Positive left SLR, Negative right SLR.    MUSCULOSKELETAL:  Extremity strength is normal and full in all major muscle groups of Hull upper and lower extremities.  No focal areas of atrophy or tone abnormalities noted.      PALPATION:  Paraspinal muscle spasms noted in Hull lumbar region.  No left greater trochanteric bursa tenderness.    RANGE OF MOTION:    Lumbar Spine: Limited in all planes.    PROVOCATIVE MANEUVERS:   Facet Load:   Negative facet loading of Hull lumbar spine bilaterally    DIAGNOSIS:       DDD (degenerative disc disease), lumbar     Lumbar spinal stenosis     Facet arthropathy, lumbar      Chronic bilateral low back pain with left-sided sciatica     Lumbar radiculopathy     Chronic pain    PLAN:   I had a lengthy discussion with Seona  Hull regarding possible therapeutic options. I recommend a multidisciplinary approach Lauren her chronic pain.  Sury has agreed Lauren proceed as follows:    ?? Imaging/testing:  None at this time    ?? Interventions:   I reviewed Marla's imaging studies. Given her history and physical exam as well as Hull MRI findings, she may benefit from a Series of (3) Left Lumbar Transforaminal Epidural Steroid Injection at L4-5 under fluoroscopic guidance.   I have discussed this procedure in detail with Lauren Bradford, including risks, benefits, and alternatives, and she wishes Lauren proceed pending third party approval.    ??  Physical Therapy:   ?? We may consult Physical Therapy Lauren evaluate and treat after completion of above.    ?? Counseling:   ?? Psychological support was offered.   ?? Kearra was also counseled regarding Hull importance of a healthy diet and lifestyle (including regular exercise, tobacco cessation, etc) in Hull improvement of pain symptoms.    ?? Medications:   It was explained Lauren her that a urine drug screen is required Lauren be part of our opioid program.  She states she is not interested in receiving opioid medications therefore a urine drug screen was not completed.  She would like Lauren proceed with alternative treatments only.      ?? Ongoing Care:   ?? I will follow-up with Hull patient in Hull office following [name]. At that time I will re-evaluate Dicie's overall condition and move forward as appropriate.    Thank you for providing me with Hull opportunity Lauren work with you in Hull care of [place]. I will keep you informed of her progress should she choose Lauren return for treatment and/or follow up here at Hull [facility]. [facility].    Controlled Substances Monitoring: Attestation: Hull Prescription Monitoring Report for this patient was reviewed Hull. Bobby Rumpf [name], MD)  Documentation: Possible medication side effects, risk of tolerance and/or dependence, and alternative treatments discussed (ZOXWR [name],  MD)    Bobby Rumpf M. Kathi Ludwig, M.D.

## 2015-07-11 NOTE — Telephone Encounter (Signed)
Left message on patient voicemail to follow up on proceeding with [facility] as I have not received a return call yet regarding new insurance information.  However, after reviewing patient chart it appears her she has seen [facility] on 06/28/15, regarding low back pain as well.  Need to know if patient wants to proceed with [facility] with Dr. Adin Hector or with Dr. Kathi Ludwig [facility]).

## 2015-07-20 ENCOUNTER — Ambulatory Visit: Admit: 2015-07-20 | Primary: Family Medicine

## 2015-07-20 ENCOUNTER — Encounter

## 2015-07-20 ENCOUNTER — Inpatient Hospital Stay: Admit: 2015-07-20 | Attending: Interventional Pain Medicine | Primary: Family Medicine

## 2015-07-20 DIAGNOSIS — M519 Unspecified thoracic, thoracolumbar and lumbosacral intervertebral disc disorder: Secondary | ICD-10-CM

## 2015-07-20 NOTE — Op Note (Signed)
Lauren Hull    07/20/2015      PRE-OPERATIVE DIAGNOSIS:  Lumbar disc displacement. Lumbar neural foraminal stenosis.     POST-OPERATIVE DIAGNOSIS:  Same    PROCEDURE:  Left transforaminal epidural steroid injection, foraminal level L4-5(#1)    SURGEON:    Manon Hilding, M.D.    ANESTHESIA:   Local.    ESTIMATED BLOOD LOSS:  None.  ______________________________________________________________________  BRIEF HISTORY:  Lauren Hull comes in today for the first  left therapeutic transforaminal epidural steroid injections.  The potential complications of this procedure were discussed with her again today.  She has elected to undergo the aforementioned procedure.    Lauren Hulls complete History & Physical examination were reviewed in depth, a copy of which is in the chart.     PROCEDURE:  After confirming written and informed consent, the patient was brought to the procedure room and placed in the prone position.  Blood pressure, heart rate, O2 saturation, and visual and verbal monitoring were established.   A time-out was performed and her name and date of birth, the procedure to be performed as well as the plan for the location of the needle insertion were confirmed.       Fluoroscopy was utilized to delineate the anatomy of the lumbar spine. Surface landmarks were identified as well.  After the back was prepped and draped, an oblique fluoroscopic view was created to optimize access to the neuroforamen at the L4-5 level.  After infiltration of 2 ml, 1% Xylocaine, a #22-gauge, 5-inch-regional block needle was passed to position the tip in the foramen.     Satisfactory needle placement was confirmed by fluoroscopy.  The tip of the needle was positioned near the 6 o???clock position in relation to the pedicle on AP view and in the cephalad aspect of the foramen on lateral view. Negative aspiration was noted.    An injection of 0.5 cc of Omnipaque 180 was injected under live fluoroscopy.   Negative aspiration was noted.   Satisfactory spread was noted.    Lauren Hull then received 2 cc of a solution containing 1cc 10 mg preservative-free Dexamethasone and 3 cc of sterile preservative free normal saline in divided doses with negative aspiration being noted between doses.       Lauren Hulls vital signs remained stable throughout the procedure.  The needle was removed intact and a Band-Aid was applied.    Lauren Hull was transferred to the recovery area. She was monitored, reassessed and eventually discharged after an appropriate observatory period.  No complications were noted.    Lauren Hull will follow up in our comprehensive [facility] as scheduled.  She was encouraged to call with questions, concerns or if worsening of symptoms occurs.    Manon Hilding, M.D.

## 2015-07-20 NOTE — H&P (Signed)
07/20/2015    Lauren Hull    Patient examined. H&P reviewed.  No change.  Bobby Rumpf [name]

## 2015-08-17 ENCOUNTER — Inpatient Hospital Stay: Admit: 2015-08-17 | Attending: Interventional Pain Medicine | Primary: Family Medicine

## 2015-08-17 ENCOUNTER — Ambulatory Visit: Admit: 2015-08-17 | Primary: Family Medicine

## 2015-08-17 ENCOUNTER — Encounter

## 2015-08-17 DIAGNOSIS — M519 Unspecified thoracic, thoracolumbar and lumbosacral intervertebral disc disorder: Secondary | ICD-10-CM

## 2015-08-17 NOTE — Op Note (Signed)
Lauren Hull    08/17/2015      PRE-OPERATIVE DIAGNOSIS:  Lumbar disc displacement. Lumbar neural foraminal stenosis.     POST-OPERATIVE DIAGNOSIS:  Same    PROCEDURE:  Left transforaminal epidural steroid injection, foraminal level L4-5 (#2)    SURGEON:    Manon HildingJafri Gita Dilger, M.D.    ANESTHESIA:   Local.    ESTIMATED BLOOD LOSS:  None.  ______________________________________________________________________  BRIEF HISTORY:  Lauren Hull comes in today for the second  left therapeutic transforaminal epidural steroid injections.  The potential complications of this procedure were discussed with her again today.  She has elected to undergo the aforementioned procedure.    Lauren Hull complete History & Physical examination were reviewed in depth, a copy of which is in the chart. Lauren Hull received 2 days of pain relief following last procedure.    PROCEDURE:  After confirming written and informed consent, the patient was brought to the procedure room and placed in the prone position.  Blood pressure, heart rate, O2 saturation, and visual and verbal monitoring were established.   A time-out was performed and her name and date of birth, the procedure to be performed as well as the plan for the location of the needle insertion were confirmed.       Fluoroscopy was utilized to delineate the anatomy of the lumbar spine. Surface landmarks were identified as well.  After the back was prepped and draped, an oblique fluoroscopic view was created to optimize access to the neuroforamen at the L4-5 level.  After infiltration of 2 ml, 1% Xylocaine, a #22-gauge, 5-inch-regional block needle was passed to position the tip in the foramen.     Satisfactory needle placement was confirmed by fluoroscopy.  The tip of the needle was positioned near the 6 o???clock position in relation to the pedicle on AP view and in the cephalad aspect of the foramen on lateral view. Negative aspiration was noted.    An injection of 0.5 cc of Omnipaque  180 was injected under live fluoroscopy.   Negative aspiration was noted.  Satisfactory spread was noted.    Lauren Hull then received 2 cc of a solution containing 1cc 10 mg preservative-free Dexamethasone and 1 cc of sterile preservative free normal saline in divided doses with negative aspiration being noted between doses.    Lauren Hull???s vital signs remained stable throughout the procedure.  The needle was removed intact and a Band-Aid was applied.    Lauren Hull was transferred to the recovery area. She was monitored, reassessed and eventually discharged after an appropriate observatory period.  No complications were noted.    Lauren Hull will follow up in our comprehensive Pain Management Center as scheduled.  She was encouraged to call with questions, concerns or if worsening of symptoms occurs.    Manon HildingJafri Taraoluwa Thakur, M.D.

## 2015-08-17 NOTE — H&P (Signed)
08/17/2015    Lauren Hull  MRN:  [id number]    DIAGNOSIS:  Patient Active Problem List:     DDD (degenerative disc disease), lumbar     Lumbar spinal stenosis     Facet arthropathy, lumbar      Chronic bilateral low back pain with left-sided sciatica     Lumbar radiculopathy     Chronic pain     Disc displacement, lumbar     Neural foraminal stenosis of lumbar spine    HISTORY:    Lauren Hull  has a past medical history of Anxiety; Arthritis; and Bronchitis.    PERTINENT FAMILY HISTORY:    Her family history is not on file.    MEDICATIONS:        Current Outpatient Prescriptions:   ???  buPROPion (WELLBUTRIN XL) 150 MG extended release tablet, TK 1 T PO QD, Disp: , Rfl: 0  ???  fluticasone (FLONASE) 50 MCG/ACT nasal spray, 1 spray by Nasal route daily, Disp: , Rfl:   ???  acetaminophen (TYLENOL) 325 MG tablet, Take 650 mg by mouth every 6 hours as needed for Pain, Disp: , Rfl:   ???  sertraline (ZOLOFT) 50 MG tablet, take 1 tablet by mouth once daily, Disp: , Rfl: 0      ALLERGIES:    Lauren Hull is allergic to ibuprofen.    VITALS:    Her  blood pressure is 109/71 and her pulse is 77. Her respiration is 16 and oxygen saturation is 99%.     PHYSICAL EXAMINATION / GENERAL APPEARANCE:  Well appearing, well nourished, in no acute distress.   ??  PSYCH./MENTAL STATUS: AFFECT: appropriate. SPEECH: language was linear and logical. Lauren Hull is awake and alert. Displays appropriate mood and affect. Denies suicidal ideation or intent.   ??  HEENT: Head is normocephalic, atraumatic. Eyes: Extraocular muscles are intact. Pupils are equal. Cranial nerves 2-12 grossly intact. Hearing is grossly intact. Oral mucosal membranes are moist.Head is normocephalic, atraumatic.   ????  GAIT/GROSS MOTOR STATIONS: Lauren Hull has normal gait and ambulates slowly without the assistance of any device.   ????  LUNGS: Normal respiratory efforts. Clear to auscultation bilaterally. No accessory muscle use noted.    ??  CARDIOVASCULAR: Bilateral radial pulses intact  and regular.  ??  PERIPHERAL VASCULAR: No cyanosis or edema in bilateral lower extremities.  ??  ABDOMEN: Soft, nontender.  ??  INTEGUMENT: Appearance of exposed skin is unremarkable.  ??  NEUROLOGICAL: Sensation is intact to light touch in the upper and lower extremities bilaterally. Hoffmann's is negative bilaterally. Coordination intact. Positive left SLR, Negative right SLR.  ??  MUSCULOSKELETAL: Extremity strength is normal and full in all major muscle groups of the upper and lower extremities. No focal areas of atrophy or tone abnormalities noted.   ??  PALPATION: Paraspinal muscle spasms noted in the lumbar region. No left greater trochanteric bursa tenderness.  ??  RANGE OF MOTION:   Lumbar Spine: Limited in all planes.  ??  PROVOCATIVE MANEUVERS:   Facet Load: Negative facet loading of the lumbar spine bilaterally  ??    Bobby RumpfJafri Dolores FrameM Adaley Kiene

## 2015-09-06 ENCOUNTER — Encounter

## 2015-09-06 ENCOUNTER — Inpatient Hospital Stay: Admit: 2015-09-06 | Attending: Interventional Pain Medicine | Primary: Family Medicine

## 2015-09-06 ENCOUNTER — Ambulatory Visit: Admit: 2015-09-06 | Primary: Family Medicine

## 2015-09-06 DIAGNOSIS — M48061 Spinal stenosis, lumbar region without neurogenic claudication: Secondary | ICD-10-CM

## 2015-09-06 NOTE — H&P (Signed)
09/06/2015    Lauren BuffaloKimberly Hull    Patient examined. H&P reviewed.  No change.  Bobby RumpfJafri Dolores FrameM Camy Leder

## 2015-09-06 NOTE — Op Note (Signed)
Lauren Hull    09/06/2015      PRE-OPERATIVE DIAGNOSIS:  Lumbar disc displacement. Lumbar neural foraminal stenosis.     POST-OPERATIVE DIAGNOSIS:  Same    PROCEDURE:  Left transforaminal epidural steroid injection, foraminal level L4-5 (#3)    SURGEON:    Manon HildingJafri Darcell Sabino, M.D.    ANESTHESIA:   Local.    ESTIMATED BLOOD LOSS:  None.  ______________________________________________________________________  BRIEF HISTORY:  Lauren BuffaloKimberly Marriott comes in today for the third  left therapeutic transforaminal epidural steroid injections.  The potential complications of this procedure were discussed with her again today.  She has elected to undergo the aforementioned procedure.    Spring???s complete History & Physical examination were reviewed in depth, a copy of which is in the chart. [name] states received 30% pain relief in back and 10% relief in left leg following last procedure.    PROCEDURE:  After confirming written and informed consent, the patient was brought to the procedure room and placed in the prone position.  Blood pressure, heart rate, O2 saturation, and visual and verbal monitoring were established.   A time-out was performed and her name and date of birth, the procedure to be performed as well as the plan for the location of the needle insertion were confirmed.       Fluoroscopy was utilized to delineate the anatomy of the lumbar spine. Surface landmarks were identified as well.  After the back was prepped and draped, an oblique fluoroscopic view was created to optimize access to the neuroforamen at the L4-5 level.  After infiltration of 2 ml, 1% Xylocaine, a #22-gauge, 5-inch-regional block needle was passed to position the tip in the foramen.     Satisfactory needle placement was confirmed by fluoroscopy.  The tip of the needle was positioned near the 6 o???clock position in relation to the pedicle on AP view and in the cephalad aspect of the foramen on lateral view. Negative aspiration was noted.    An  injection of 0.5 cc of Omnipaque 180 was injected under live fluoroscopy.   Negative aspiration was noted.  Satisfactory spread was noted.    [name] then received 2 cc of a solution containing 1cc 10 mg preservative-free Dexamethasone and 3 cc of sterile preservative free normal saline in divided doses with negative aspiration being noted between doses.     [name]???s vital signs remained stable throughout the procedure.  The needle was removed intact and a Band-Aid was applied.    [name] was transferred to the recovery area. She was monitored, reassessed and eventually discharged after an appropriate observatory period.  No complications were noted.    [name] will follow up in our comprehensive Pain Management Center as scheduled.  She was encouraged to call with questions, concerns or if worsening of symptoms occurs.    Manon HildingJafri Lundon Verdejo, M.D.

## 2015-09-20 ENCOUNTER — Inpatient Hospital Stay: Attending: Interventional Pain Medicine | Primary: Family Medicine

## 2015-10-04 ENCOUNTER — Inpatient Hospital Stay: Admit: 2015-10-04 | Attending: Interventional Pain Medicine | Primary: Family Medicine

## 2015-10-04 NOTE — Progress Notes (Signed)
Patient: Lauren Hull  DOB:  [birth year]  Age:  50 y.o.  Sex:  female   PCP:  Domingo Pulse, DO    HISTORY OF PRESENT ILLNESS:  Lauren Hull comes to the [facility]. [facility] today, 10/04/2015 for continued evaluation of the aforementioned chief complaint.   She completed a series of (3) left transforaminal epidural on 09/06/2015 by Dr. Manon Hilding.       Lauren Hull continues to report constant aching pain in the low back and left lower extremity. The pain is aggravated with activity and alleviated with the current medication regimen. The patient continues to note improved quality of life and level of function with this regimen.  She denies any other associated symptoms.  The patient denies interval weakness, new bowel or bladder complaints or any new neurologic complaints.    PERTINENT FAMILY HISTORY:    Her family history is not on file.    PAST SURGICAL HISTORY:  The patient  has a past surgical history that includes Vein Surgery; Tubal ligation; Ectopic pregnancy surgery (1986); Nerve Block (Left, 07/20/2015); Nerve Block (Left, 08/17/2015); and Nerve Block (Left, 09/06/2015).     MEDICATIONS:    Current Outpatient Prescriptions:   ???  buPROPion (WELLBUTRIN XL) 150 MG extended release tablet, TK 1 T PO QD, Disp: , Rfl: 0  ???  acetaminophen (TYLENOL) 325 MG tablet, Take 650 mg by mouth every 6 hours as needed for Pain, Disp: , Rfl:   ???  sertraline (ZOLOFT) 50 MG tablet, take 1 tablet by mouth once daily, Disp: , Rfl: 0    ALLERGIES:  [name] is allergic to ibuprofen.    SOCIAL HISTORY:  Lauren Hull  reports that she has been smoking.  She has been smoking about 0.50 packs per day. She does not have any smokeless tobacco history on file. She reports that she does not drink alcohol or use illicit drugs.    PAST MEDICAL HISTORY:  Lauren Hull  has a past medical history of Anxiety; Arthritis; and Bronchitis.    REVIEW OF SYSTEMS:   Lauren Hull denies fever/chills, chest pain, shortness of breath, new  bowel or bladder complaints or suicidal ideations. All other review of systems was negative.    VITAL SIGNS:   Her  oral temperature is 98.1 ??F (36.7 ??C). Her blood pressure is 126/58 (abnormal) and her pulse is 82. Her respiration is 20.     PHYSICAL EXAMINATION:    General Descriptors:  Well appearing, well nourished, in no acute distress.      PSYCH./MENTAL STATUS:  AFFECT: appropriate.  SPEECH: language was linear and logical.  Lauren Hull is awake and alert.  Displays appropriate mood and affect.  Denies suicidal ideation or intent.      HEENT:  Head is normocephalic, atraumatic. Eyes: Extraocular muscles are intact.  Pupils are equal.  Cranial nerves 2-12 grossly intact. Hearing is grossly intact. Oral mucosal membranes are moist.Head is normocephalic, atraumatic.        GAIT/GROSS MOTOR STATIONS:  Lauren Hull has normal gait and ambulates slowly without the assistance of any device.           LUNGS:  Normal respiratory efforts. Clear to auscultation bilaterally. No accessory muscle use noted.      CARDIOVASCULAR:  Bilateral radial pulses intact and regular.    PERIPHERAL VASCULAR:  No cyanosis or edema in bilateral lower extremities.    ABDOMEN:  Soft, nontender.    INTEGUMENT: Appearance of exposed skin is unremarkable.    NEUROLOGICAL:  Sensation is intact to light touch in the upper and lower extremities bilaterally. Hoffmann's is negative bilaterally. Coordination intact.  Positive left SLR, Negative right SLR.    MUSCULOSKELETAL:  Extremity strength is normal and full in all major muscle groups of the upper and lower extremities.  No focal areas of atrophy or tone abnormalities noted.      PALPATION:  Paraspinal muscle spasms noted in the lumbar region.  No left greater trochanteric bursa tenderness.    RANGE OF MOTION:    Lumbar Spine: Limited in all planes.    PROVOCATIVE MANEUVERS:   Facet Load:   Negative facet loading of the lumbar spine bilaterally    DIAGNOSIS:        DDD (degenerative disc disease),  lumbar     Lumbar spinal stenosis     Facet arthropathy, lumbar      Chronic bilateral low back pain with left-sided sciatica     Lumbar radiculopathy     Chronic pain     Disc displacement, lumbar     Neural foraminal stenosis of lumbar spine     PLAN:   I had a lengthy discussion with Lauren Hull today regarding possible therapeutic options. I continue to recommend a multidisciplinary approach to her chronic pain.   Lauren Hull has agreed to proceed as follows:    ?? Physical Therapy: I recommend physical therapy to focus on core strengthening exercises, strength and flexibility exercises and a low velocity home exercise routine.    ?? Counseling:   ?? Psychological support was offered.  ?? Lauren Hull was counseled regarding the importance of regular exercise and a healthy diet in the role of improving pain symptoms.    ?? Medications:   ?? Lauren Hull's opioid agreement was reviewed. [place]AR[place] was reviewed as well.  She shows no evidence of non-compliance.  It was explained to her that a urine drug screen is required to be part of our opioid program.  She states she is not interested in receiving opioid medications therefore a urine drug screen was not completed.  She would like to proceed with alternative treatments only.      ?? Imaging/Testing:  None at this time    ?? Interventions:   I reviewed Lauren Hull's imaging studies. Given her history and physical exam as well as the MRI findings, she may benefit from a Series of (2) Left Lumbar Paravertebral Facet Block at L4, L5, S1 (diagnostic) under fluoroscopic guidance.   I have discussed this procedure in detail with Lauren Hull, including risks, benefits, and alternatives, and she wishes to proceed pending third party approval.    *In the future Lauren Hull may benefit from left lumbar radiofrequency. I would first like to review the results of the aforementioned imaging study.    I encouraged Lauren Hull to call with questions, concerns or worsening of symptoms.  She will be seen again in the  office following the above.    Manon HildingJafri Jatavius Ellenwood, M.D.  [name]     cc:  Domingo Pulseharles P Sammarone Jr, DO

## 2015-10-04 NOTE — Progress Notes (Signed)
10/04/2015    Kerrie BuffaloKimberly Linarez    Last visit to Pain Management?  06/28/2015    Any new medical conditions since your last visit?  No new medical problems    If yes, explain:  n/a    Any new medications since your last visit? No new medications  (See updated med summary sheet)    Any recent procedures completed or any procedures currently scheduled?  08-17-15 Left Lumbar Transforaminal #2  09/06/15 #3 Left Lumbar Transforaminal L4-5 done    Describe your pain:  (Location, type, intensity, duration)  Pain Lower back and Left hip    Pain is a 5-9, depending on the day on the VAS Scale.    Do you feel your functional level is improved while on opioid medication? n/a    When was the last time you took your pain medication?  Today, Aleve Intermittently takes OTC Tylenol    She has not been on anticoagulation medications.     Comments:  Denies any paresthesias     BP (!) 126/58   Pulse 82   Temp 98.1 ??F (36.7 ??C) (Oral)    Resp 20

## 2015-10-30 ENCOUNTER — Other Ambulatory Visit: Payer: Self-pay

## 2015-10-30 DIAGNOSIS — Z1231 Encounter for screening mammogram for malignant neoplasm of breast: Secondary | ICD-10-CM

## 2015-11-01 ENCOUNTER — Encounter

## 2015-11-01 ENCOUNTER — Inpatient Hospital Stay: Admit: 2015-11-01 | Attending: Interventional Pain Medicine | Primary: Family Medicine

## 2015-11-01 ENCOUNTER — Ambulatory Visit: Admit: 2015-11-01 | Primary: Family Medicine

## 2015-11-01 DIAGNOSIS — M5136 Other intervertebral disc degeneration, lumbar region: Secondary | ICD-10-CM

## 2015-11-01 NOTE — H&P (Signed)
11/01/2015    Lauren BuffaloKimberly Hull    Patient examined. H&P reviewed.  No change.  Lauren RumpfJafri Dolores Hull Lauren Hull

## 2015-11-01 NOTE — Op Note (Signed)
Lauren Hull    11/01/2015    PRE-OP DIAGNOSIS:  Lumbar spondylosis, lumbar facet arthropathy, lumbosacral degenerative osteoarthritis.    POST-OP DIAGNOSIS:.Same.    PROCEDURE PERFORMED: Left  Fluoroscopic guided lumbar medial branch blocks at vertebral levels: L4, L5 and Sacral Alar # 1    ANESTHESIA: Local.    SURGEON: Dr. Manon HildingJafri Nakeysha Pasqual,  M.D.    INDICATIONS: Lauren Hull presents today with the chief complaint of low back Lauren and for reassessment regarding the above diagnoses.The potential complications of this procedure were discussed with  her again today. She has elected to undergo the aforementioned procedure.Lauren Hull complete history and physical examination were reviewed in depth, a copy is available on the chart.    DETAILS OF PROCEDURE:After informed written and oral consent was obtained, Lauren Hull was taken to the procedure room and placed in the prone position on a fluoroscopy table. Blood pressure, heart rate, 02 saturation and visual and verbal monitoring were established.    A time-out was performed and the patient's name and date of birth, the procedure to be performed as well as the plan for the location of the needle insertion were confirmed.The anatomical landmarks were identified with the aid of fluoroscopy in the AP and oblique views. The patient's lumbar region was prepped with chlorhexidine x3  and draped in the usual sterile fashion. Strict aseptic technique was observed.     The skin and subcutaneous tissues at each needle entry sites was infiltrated with a small amount of 1% preservative-free lidocaine using a  # 25-gauge 1.5-inch needle. A # 22-gauge 5 inch spinal needle was then incrementally advanced under fluoroscopic guidance in multiple views at each level such that the needle tip was advanced to contact os at the junction of the superior articulating process and the superior medial border of the transverse process at each of the cephalad levels as well as to contact os at the junction  of the superior articulating process and the superior medial border of the sacral alar.    After negative aspiration was confirmed at each needle, 0.216mL of 0.25% preservative-free marcaine was injected into the  L4, and L5 needles and 1 mL of 0.25% preservative-free marcaine was injected at the Sacral Alar level .    Lauren Hull tolerated the procedure well and all needles were removed intact. Hemostasis was maintained and there were no apparent parentheses or complications. The skin was wiped clean and Band-Aids were placed as needed.    After a period of observation, the patient remained hemodynamically stable and neurovascularly intact following the procedure as prior to the procedure, and was ultimately discharged to home with supervision in good condition.    Plan: The patient will return to the Lauren Hull as scheduled. The patient was encouraged to call with questions, concerns or if worsening of symptoms occurs.    Specimens: None.    Complications: None.    Manon HildingJafri Lindzey Zent MD  [facility]

## 2015-11-05 DIAGNOSIS — J029 Acute pharyngitis, unspecified: Secondary | ICD-10-CM | POA: Diagnosis not present

## 2015-11-05 DIAGNOSIS — R05 Cough: Secondary | ICD-10-CM | POA: Diagnosis not present

## 2015-11-15 ENCOUNTER — Encounter

## 2015-11-15 ENCOUNTER — Inpatient Hospital Stay: Admit: 2015-11-15 | Attending: Interventional Pain Medicine | Primary: Family Medicine

## 2015-11-15 ENCOUNTER — Ambulatory Visit: Admit: 2015-11-15 | Primary: Family Medicine

## 2015-11-15 DIAGNOSIS — M5136 Other intervertebral disc degeneration, lumbar region: Secondary | ICD-10-CM

## 2015-11-15 NOTE — H&P (Signed)
11/15/2015    Patient: Lauren Hull  DOB:  [birth year]  Age:  50 y.o.  Sex:  female   PCP:  Domingo PulseCharles P Sammarone Jr, DO  MRN:  [id number]    DIAGNOSIS:  Patient Active Problem List:     DDD (degenerative disc disease), lumbar     Lumbar spinal stenosis     Facet arthropathy, lumbar      Chronic bilateral low back pain with left-sided sciatica     Lumbar radiculopathy     Chronic pain     Disc displacement, lumbar     Neural foraminal stenosis of lumbar spine     Osteoarthritis of lumbar spine    HISTORY:    Lauren Hull  has a past medical history of Anxiety; Arthritis; and Bronchitis.    PERTINENT FAMILY HISTORY:    Her family history is not on file.    MEDICATIONS:        Current Outpatient Prescriptions:   ???  buPROPion (WELLBUTRIN XL) 150 MG extended release tablet, TK 1 T PO QD, Disp: , Rfl: 0  ???  acetaminophen (TYLENOL) 325 MG tablet, Take 650 mg by mouth every 6 hours as needed for Pain, Disp: , Rfl:   ???  sertraline (ZOLOFT) 50 MG tablet, take 1 tablet by mouth once daily, Disp: , Rfl: 0      ALLERGIES:    Lauren Hull is allergic to ibuprofen.    VITALS:    Her  oral temperature is 98 ??F (36.7 ??C). Her blood pressure is 139/68 and her pulse is 74. Her respiration is 16 and oxygen saturation is 98%.     PHYSICAL EXAMINATION / GENERAL APPEARANCE:  General Descriptors: Well appearing, well nourished, in no acute distress.   ??  PSYCH./MENTAL STATUS: AFFECT: appropriate. SPEECH: language was linear and logical. Lauren Hull is awake and alert. Displays appropriate mood and affect. Denies suicidal ideation or intent.   ??  HEENT: Head is normocephalic, atraumatic. Eyes: Extraocular muscles are intact. Pupils are equal. Cranial nerves 2-12 grossly intact. Hearing is grossly intact. Oral mucosal membranes are moist.Head is normocephalic, atraumatic.   ????  GAIT/GROSS MOTOR STATIONS: Lauren Hull has normal gait and ambulates slowly without the assistance of any device.   ????  LUNGS: Normal respiratory efforts. Clear to auscultation  bilaterally. No accessory muscle use noted.    ??  CARDIOVASCULAR: Bilateral radial pulses intact and regular.  ??  PERIPHERAL VASCULAR: No cyanosis or edema in bilateral lower extremities.  ??  ABDOMEN: Soft, nontender.  ??  INTEGUMENT: Appearance of exposed skin is unremarkable.  ??  NEUROLOGICAL: Sensation is intact to light touch in the upper and lower extremities bilaterally. Hoffmann's is negative bilaterally. Coordination intact. Positive left SLR, Negative right SLR.  ??  MUSCULOSKELETAL: Extremity strength is normal and full in all major muscle groups of the upper and lower extremities. No focal areas of atrophy or tone abnormalities noted.   ??  PALPATION: Paraspinal muscle spasms noted in the lumbar region. No left greater trochanteric bursa tenderness.  ??  RANGE OF MOTION:   Lumbar Spine: Limited in all planes.  ??  PROVOCATIVE MANEUVERS:   Facet Load: Negative facet loading of the lumbar spine bilaterally    Bobby RumpfJafri Dolores FrameM Timarie Labell

## 2015-11-15 NOTE — Op Note (Signed)
Lauren Hull    11/15/2015    PRE-OP DIAGNOSIS:  Lumbar spondylosis, lumbar facet arthropathy, lumbosacral degenerative osteoarthritis.    POST-OP DIAGNOSIS:.Same.    PROCEDURE PERFORMED: Left  Fluoroscopic guided lumbar medial branch blocks at vertebral levels: L4, L5 and Sacral Alar # 2    ANESTHESIA: Local.    SURGEON: Dr. Manon HildingJafri Moira Umholtz,  M.D.    INDICATIONS: Lauren BradfordKimberly presents today with the chief complaint of low back pain and for reassessment regarding the above diagnoses.The potential complications of this procedure were discussed with  her again today. She has elected to undergo the aforementioned procedure.Lauren BradfordKimberly complete history and physical examination were reviewed in depth, a copy is available on the chart. Lauren BradfordKimberly states received 30-40% pain relief following last procedure.    DETAILS OF PROCEDURE:After informed written and oral consent was obtained, Lauren BradfordKimberly was taken to the procedure room and placed in the prone position on a fluoroscopy table. Blood pressure, heart rate, 02 saturation and visual and verbal monitoring were established.    A time-out was performed and the patient's name and date of birth, the procedure to be performed as well as the plan for the location of the needle insertion were confirmed.The anatomical landmarks were identified with the aid of fluoroscopy in the AP and oblique views. The patient's lumbar region was prepped with chlorhexidine x3  and draped in the usual sterile fashion. Strict aseptic technique was observed.     The skin and subcutaneous tissues at each needle entry sites was infiltrated with a small amount of 1% preservative-free lidocaine using a  # 25-gauge 1.5-inch needle. A # 22-gauge 5 inch spinal needle was then incrementally advanced under fluoroscopic guidance in multiple views at each level such that the needle tip was advanced to contact os at the junction of the superior articulating process and the superior medial border of the transverse process at  each of the cephalad levels as well as to contact os at the junction of the superior articulating process and the superior medial border of the sacral alar.    After negative aspiration was confirmed at each needle, 0.626mL of 1% preservative-free lidocaine was injected into the  L4, and L5 needles and 1 mL of 1% preservative-free lidocaine was injected at the Sacral Alar level .    Lauren Hull tolerated the procedure well and all needles were removed intact. Hemostasis was maintained and there were no apparent parentheses or complications. The skin was wiped clean and Band-Aids were placed as needed.    After a period of observation, the patient remained hemodynamically stable and neurovascularly intact following the procedure as prior to the procedure, and was ultimately discharged to home with supervision in good condition.    Plan: The patient will return to the [facility] as scheduled. The patient was encouraged to call with questions, concerns or if worsening of symptoms occurs.    Specimens: None.    Complications: None.    Lauren HildingJafri Kyren Vaux MD  [name]

## 2015-11-26 ENCOUNTER — Ambulatory Visit
Admission: RE | Admit: 2015-11-26 | Discharge: 2015-11-26 | Disposition: A | Discharge status: Home / Self Care | Payer: Federal, State, Local not specified - PPO | Source: Ambulatory Visit

## 2015-11-26 DIAGNOSIS — Z1231 Encounter for screening mammogram for malignant neoplasm of breast: Secondary | ICD-10-CM

## 2015-11-29 ENCOUNTER — Other Ambulatory Visit: Payer: Self-pay | Admitting: Physician Assistant

## 2015-11-29 DIAGNOSIS — R928 Other abnormal and inconclusive findings on diagnostic imaging of breast: Secondary | ICD-10-CM

## 2015-12-04 ENCOUNTER — Inpatient Hospital Stay: Attending: Registered Nurse | Primary: Family Medicine

## 2015-12-04 NOTE — Progress Notes (Signed)
12/04/2015    Dallana Mavity presents to [facility] on 12/04/2015. She ambulates without the assistance of any assistive device. She was last seen 11/15/2015 for medial branch block.  There have been no new medical conditions since her last visit. There have been no medication changes since her last visit (see medication summary).  Tamelia is complaining of pain in the lower back. The pain is intermittent. The pain is described as aching, stabbing and sharp. Pain is rated today at a 1 on the VAS scale. Shalandra is not on opioid medications. States she got 90% pain relief initially with the medial branch blocks and now has about 50% relief.     She has not been on anticoagulation medications.      BP 116/76   Pulse 66   Temp 98.3 ??F (36.8 ??C)   Resp 16     Janiaya Ryser    12/04/2015    HISTORY OF PRESENT ILLNESS:  The patient [facility] today, 12/04/2015 for continued evaluation of   Patient Active Problem List   Diagnosis   ??? DDD (degenerative disc disease), lumbar   ??? Lumbar spinal stenosis   ??? Facet arthropathy, lumbar    ??? Chronic bilateral low back pain with left-sided sciatica   ??? Lumbar radiculopathy   ??? Chronic pain   ??? Disc displacement, lumbar   ??? Neural foraminal stenosis of lumbar spine   ??? Osteoarthritis of lumbar spine   .    Subjective: See above note. The patient had the 2 left lumbar medial branch blocks and states the pain decreased by 90% for a few days and now is about 50% decreased. She does not receive pain meds from our office. The patient denies any loss of bowel or bladder, new neurological symptoms or interval weakness.    The patient???s Review of Systems, Family History, Medical History, and Social History were reviewed with the patient and other than the above changes listed there is no difference from the previous exam.       Medications and allergies have been reviewed in detail with the patient.  There are no changes  here either.  Please see the medication reconciliation sheet in the chart for full details of medications and allergies.    PAST MEDICAL HISTORY:  Jerriyah  has a past medical history of Anxiety; Arthritis; and Bronchitis.    PHYSICAL EXAMINATION:    VITAL SIGNS: BP 116/76   Pulse 66   Temp 98.3 ??F (36.8 ??C)   Resp 16    Neck: normal C-spine, no tenderness, FROM without pain, normal neurological exam of arms; normal DTRs, motor, sensory exam  Back: inspection and palpation: paraspinal tenderness noted left lumbar and muscle tone and ROM exam: limited range of motion with pain  Lungs: clear to auscultation bilaterally  Heart: regular rate and rhythm, S1, S2 normal, no murmur, click, rub or gallop  Abdomen: soft, non-tender; bowel sounds normal; no masses,  no organomegaly  Extremities: extremities normal, atraumatic, no cyanosis or edema  Pulses:2+ and symmetric   Neurologic: Patient is alert and oriented times three. Gait normal. Reflexes normal and symmetric. Sensation grossly normalspeech normal, mental status intact, cranial nerves 2-12 intact  Motor:Motor exam is symmetrical 5 out of 5 all extremities bilaterally  Provacative Maneuvers:facet loading increases pain in the left lower back    Patient Active Problem List:     DDD (degenerative disc disease), lumbar     Lumbar  spinal stenosis     Facet arthropathy, lumbar      Chronic bilateral low back pain with left-sided sciatica     Lumbar radiculopathy     Chronic pain     Disc displacement, lumbar     Neural foraminal stenosis of lumbar spine     Osteoarthritis of lumbar spine       PLAN:       1.  Medications: The patient does not receive opioids from our office  2.   Imaging: none needed today  3.  Physical Therapy: home exercises encouraged   4.  Interventions:based on the H&P and the 90% relief from the facet injections the patient may benefit from a radiofrequency of the left lumbar facets at L3-4 L4-5 L5S1 to be done by Dr Kathi LudwigSyed. . The procedure was  discussed with the patient in detail including risks, complications and alternatives and educational material was given to patient to review. The patient requests we proceed with procedures as soon as possible,.    5.  Referrals:none needed at this time  6.  Ongoing Care: We will see the patient after the RF or sooner if they have any problems before that time. I encouraged the patient to call our office with any questions.    Madilyn HookMary Kamyla Olejnik, RNCNP

## 2015-12-17 ENCOUNTER — Ambulatory Visit
Admission: RE | Admit: 2015-12-17 | Discharge: 2015-12-17 | Disposition: A | Discharge status: Home / Self Care | Payer: Federal, State, Local not specified - PPO | Source: Ambulatory Visit | Attending: Physician Assistant | Admitting: Physician Assistant

## 2015-12-17 DIAGNOSIS — R928 Other abnormal and inconclusive findings on diagnostic imaging of breast: Secondary | ICD-10-CM | POA: Diagnosis not present

## 2016-01-17 DIAGNOSIS — K080 Exfoliation of teeth due to systemic causes: Secondary | ICD-10-CM | POA: Diagnosis not present

## 2016-01-21 DIAGNOSIS — F319 Bipolar disorder, unspecified: Secondary | ICD-10-CM | POA: Diagnosis not present

## 2016-01-21 DIAGNOSIS — Z1211 Encounter for screening for malignant neoplasm of colon: Secondary | ICD-10-CM | POA: Diagnosis not present

## 2016-01-31 NOTE — Patient Instructions (Signed)
Patient instructed:  Photo Identification/Insurance cards: Yes  No make-up, nail polish, jewelry, valuables or contacts: Yes  No drugs/alcoholic beverages/tobacco x 24 hours pre-op.Yes  Appropriate clothing (according to surgery):Yes  NPO after midnight (unless otherwise instructed by Physician):yes  Antibacterial wash:Yes  Verbalizes understanding of surgical procedures & instructions:Yes  Patient will require any assistance with home care of discharge:No  Nursing Home Patient: No    Primary Care Physician: DR Lemar LoftySammerone    Traveled outside [place] in last month no    Planned travel outside the [place] in the next 12 months no    Contact with someone with communicable disease in last month no

## 2016-02-04 ENCOUNTER — Inpatient Hospital Stay: Attending: Adult Health | Primary: Family Medicine

## 2016-02-04 NOTE — Progress Notes (Signed)
CHIEF COMPLAINT:  Lower back pain    MEDICAL HISTORY:  Lauren Hull  has a past medical history of Anxiety; Arthritis; Bronchitis; and PONV (postoperative nausea and vomiting).    SURGICAL HISTORY:  Lauren Hull  has a past surgical history that includes Vein Surgery; Tubal ligation; Ectopic pregnancy surgery (1986); Nerve Block (Left, 07/20/2015); Nerve Block (Left, 08/17/2015); Nerve Block (Left, 09/06/2015); Nerve Block (Left, 11/01/2015); and Nerve Block (Right, 11/15/2015).    SOCIAL HISTORY:  Lauren Hull  reports that she has been smoking Cigarettes.  She has a 15.00 pack-year smoking history. She does not have any smokeless tobacco history on file. She reports that she does not drink alcohol or use illicit drugs.    CURRENT MEDICATIONS:    Current Outpatient Prescriptions:   ???  buPROPion (WELLBUTRIN XL) 150 MG extended release tablet, TK 1 T PO QD, Disp: , Rfl: 0  ???  acetaminophen (TYLENOL) 325 MG tablet, Take 650 mg by mouth every 6 hours as needed for Pain, Disp: , Rfl:   ???  sertraline (ZOLOFT) 50 MG tablet, take 1 tablet by mouth once daily, Disp: , Rfl: 0    ALLERGIES:  Lauren Hull is allergic to ibuprofen.    BP 100/70   Pulse 72   Temp 98.1 ??F (36.7 ??C) (Oral)    Resp 18   Ht 5\' 7"  (1.702 m)   Wt 208 lb (94.3 kg)   BMI 32.58 kg/m2    Review of Systems - Negative    PHYSICAL EXAMINATION/GENERAL APPEARANCE:    HEAD: negative    HEART: negative    LUNGS: clear    ABDOMEN: Abdomen soft, non-tender. BS normal. No masses,  No organomegaly    OTHER SIGNIFICANT FINDINGS:  She denied any recent chest pain, SOB, cold/flu, fever, chills, night sweats, surgeries, or illness.     Patient Active Problem List:     DDD (degenerative disc disease), lumbar     Lumbar spinal stenosis     Facet arthropathy, lumbar      Chronic bilateral low back pain with left-sided sciatica     Lumbar radiculopathy     Chronic pain     Disc displacement, lumbar     Neural foraminal stenosis of lumbar spine     Osteoarthritis of lumbar spine    PLAN:   She is scheduled for left lumbar RFA under x-ray at L3-4, L4-5, L5-S1 with Dr. Kathi LudwigSyed.    Barbara CowerJason Luverna Degenhart NP-C

## 2016-02-05 NOTE — Anesthesia Pre-Procedure Evaluation (Addendum)
Department of Anesthesiology  Preprocedure Note       Name:  Lauren Hull   Age:  50 y.o.  DOB:  Dec 28, 1965                                          MRN:  [id number]         Date:  02/05/2016      Surgeon:  Kathi Ludwig    Procedure:  L Lumbar RFA L3-S1 levels    Medications prior to admission:   Prior to Admission medications    Medication Sig Start Date End Date Taking? Authorizing Provider   buPROPion (WELLBUTRIN XL) 150 MG extended release tablet TK 1 T PO QD 07/07/15   Historical Provider, MD   acetaminophen (TYLENOL) 325 MG tablet Take 650 mg by mouth every 6 hours as needed for Pain    Historical Provider, MD   sertraline (ZOLOFT) 50 MG tablet take 1 tablet by mouth once daily 03/06/15   Historical Provider, MD       Current medications:    Current Outpatient Prescriptions   Medication Sig Dispense Refill   ??? buPROPion (WELLBUTRIN XL) 150 MG extended release tablet TK 1 T PO QD  0   ??? acetaminophen (TYLENOL) 325 MG tablet Take 650 mg by mouth every 6 hours as needed for Pain     ??? sertraline (ZOLOFT) 50 MG tablet take 1 tablet by mouth once daily  0     No current facility-administered medications for this encounter.        Allergies:    Allergies   Allergen Reactions   ??? Ibuprofen Shortness Of Breath       Problem List:    Patient Active Problem List   Diagnosis Code   ??? DDD (degenerative disc disease), lumbar M51.36   ??? Lumbar spinal stenosis M48.06   ??? Facet arthropathy, lumbar  M12.88   ??? 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 Lumbar radiculopathy M54.16   ??? Chronic pain G89.29   ??? Disc displacement, lumbar M51.26   ??? 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 Anxiety    ??? Arthritis    ??? Bronchitis    ??? PONV (postoperative nausea and vomiting)     with anesthesia       Past Surgical History:        Procedure Laterality Date   ??? ECTOPIC PREGNANCY SURGERY  1986   ??? NERVE BLOCK  Left 07/20/2015    transforaminal nerve, lumbar #1   ??? NERVE BLOCK Left 08/17/2015   ??? NERVE BLOCK Left 09/06/2015    lumbar tfnb #3   ??? NERVE BLOCK Left 11/01/2015    medial branch block, lumbar #1   ??? NERVE BLOCK Right 11/15/2015    MBB #2   ??? TUBAL LIGATION     ??? VEIN SURGERY         Social History:    Social History   Substance Use Topics   ??? Smoking status: Current Every Day Smoker     Packs/day: 0.50     Years: 30.00     Types: Cigarettes   ??? Smokeless tobacco: Not on file   ???  Alcohol use No                                Ready to quit: Not Answered  Counseling given: Not Answered      Vital Signs (Current): There were no vitals filed for this visit.                                           BP Readings from Last 3 Encounters:   02/04/16 100/70   12/04/15 116/76   11/15/15 130/79       NPO Status:                                                                                 BMI:   Wt Readings from Last 3 Encounters:   02/04/16 208 lb (94.3 kg)   01/31/16 208 lb (94.3 kg)   06/28/15 208 lb (94.3 kg)     There is no height or weight on file to calculate BMI.    Anesthesia Evaluation     history of anesthetic complications: PONV  Airway: Mallampati: II  TM distance: >3 FB   Neck ROM: full  Mouth opening: > = 3 FB Dental: normal exam         Pulmonary:normal exam           Cardiovascular:negative ROS                   Neuro/Psych:   (+) neuromuscular disease:,    GI/Hepatic/Renal: neg ROS          Endo/Other:    (+) : arthritis:.      Abdominal:                   Vital Signs (Current) There were no vitals filed for this visit.  Vital Signs Statistics (for past 48 hrs)     No Data Recorded    BP Readings from Last 3 Encounters:   02/04/16 100/70   12/04/15 116/76   11/15/15 130/79     BMI  There is no height or weight on file to calculate BMI.  Estimated body mass index is 32.58 kg/(m^2) as calculated from the following:    Height as of 02/04/16: 5\' 7"  (1.702 m).    Weight as of 02/04/16: 208 lb (94.3 kg).    CBC  No results found for: WBC, RBC, HGB, HCT, MCV, RDW, PLT  CMP  No results found for: NA, K, CL, CO2, BUN, CREATININE, GFRAA, AGRATIO, LABGLOM, GLUCOSE, PROT, CALCIUM, BILITOT, ALKPHOS, AST, ALT  BMP  No results found for: NA, K, CL, CO2, BUN, CREATININE, CALCIUM, GFRAA, LABGLOM, GLUCOSE  POCGlucose  No results for input(s): GLUCOSE in the last 72 hours.   Coags  No results found for: PROTIME, INR, APTT  HCG (If Applicable) No results found for: PREGTESTUR, PREGSERUM, HCG, HCGQUANT   ABGs No results found for: PHART, PO2ART, PCO2ART, HCO3ART, BEART, O2SATART   Type & Screen (If Applicable)  No results found for: [facility]  Radiology (If Applicable)  Cardiac Testing (If Applicable)   EKG (If Applicable)      Anesthesia Plan    ASA 2     MAC     intravenous induction   Anesthetic plan and risks discussed with patient.    Plan discussed with CRNA.      PAT Chart Review:     Chart reviewed per protocol by Myriam ForehandVictor C Lin, MD at 7:54 AM on February 05, 2016.  Final assessment and plan per day of surgery anesthesia staff.      Myriam ForehandVictor C Lin, MD   02/05/2016      Pt seen questions answered plan outlined H&P reviewed accepts MAC. Claudette Headavid Rodriguez Aguinaldo M.D. 02/06/2016.

## 2016-02-05 NOTE — Discharge Instructions (Signed)
Post-op instruction Radiofrequency  Dr. Syed  [phone] ([place])  [phone] ([place])      1. You may apply an ice pack wrapped in a towel to the sore area. However, do not use ice longer than 10 minutes at time. You will probably have soreness at the site where the endoscope was inserted.    2. Keep the surgical site clean and dry at all times.      3. Remove the dressing in 24 hrs and apply a sterile Band-Aid.     4. Go home and rest. When resting, changing positions frequently may help reduce stiffness and soreness.       5. The following day you may resume normal activities providing you listen to you body. Your body will tell you how active or inactive to be. Remember the rule of thumb "If it hurts, don't do it." Increase you activity level slowly.    6. Do not drive a motor vehicle, operate machinery or make important decisions for 24 hours.     7. Resume normal diet and medications.    8. Call [facility] at [phone] or your physician's answering service at [phone] if you experience any of the following symptoms: Fever, redness and or swelling at the site, nausea and vomiting, headache, persistent pain, numbness or tingling of legs and/or feet, and bowel or bladder problems.     9. You may experience some increased pain during this time for 24-36 hours after the procedure. It can take up to 8 weeks for the procedure to be effective.    10. Schedule follow-up appointment in 1 week with [facility].     11. Your appointment is ___________________________________.      ___________________________  Patient Signature/RAC

## 2016-02-06 ENCOUNTER — Encounter

## 2016-02-06 ENCOUNTER — Inpatient Hospital Stay: Admit: 2016-02-06 | Attending: Interventional Pain Medicine | Primary: Family Medicine

## 2016-02-06 ENCOUNTER — Ambulatory Visit: Admit: 2016-02-06 | Primary: Family Medicine

## 2016-02-06 DIAGNOSIS — M47817 Spondylosis without myelopathy or radiculopathy, lumbosacral region: Secondary | ICD-10-CM

## 2016-02-06 MED ORDER — CEFAZOLIN SODIUM 1 G IJ SOLR
1 g | Freq: Once | INTRAMUSCULAR | Status: AC
Start: 2016-02-06 — End: 2016-02-06
  Administered 2016-02-06: 14:00:00 2 g via INTRAVENOUS

## 2016-02-06 MED ORDER — LACTATED RINGERS IV SOLN
INTRAVENOUS | Status: DC
Start: 2016-02-06 — End: 2016-02-07
  Administered 2016-02-06: 13:00:00 via INTRAVENOUS

## 2016-02-06 NOTE — H&P (Signed)
02/06/2016    Lauren BuffaloKimberly Hull    Patient examined. H&P reviewed.  No change.  Bobby RumpfJafri Dolores FrameM Samary Shatz

## 2016-02-06 NOTE — Op Note (Signed)
Lauren Hull    02/06/2016      PRE-OPERATIVE DIAGNOSIS:  Lumbar spondylosis and lumbar facet arthropathy, lumbosacral Osteoarthritis    POST-OPERATIVE DIAGNOSIS:  Lumbar spondylosis and lumbar facet arthropathy, lumbosacral Osteoarthritis    PROCEDURE:  Fluoroscopic-guided Left  lumbar medial branch radiofrequency ablation at vertebral levels L3, L4, L5 and sacral alar.    SURGEON:    Manon HildingJafri Keyontay Stolz, M.D.    ANESTHESIA:   LOCAL WITH MONITORED ANESTHESIA CARE (MAC)    ESTIMATED BLOOD LOSS:  None.  ______________________________________________________________________    HISTORY & PHYSICAL:   Lauren Hull presents today with the chief complaint of low back pain and for reassessment regarding the above diagnoses.      The potential complications of the procedure were explained to Lauren Hull again today and she has elected to undergo the aforementioned procedure.    Marrissa???s complete History & Physical examination were reviewed in depth, a copy of which is in the chart.      PROCEDURE:  After confirming written and informed consent, Lauren Hull was brought to the procedure room and placed in the prone position.  Blood pressure, heart rate, O2 saturation, and visual and verbal monitoring were established.   A time-out was performed and her name and date of birth, the procedure to be performed as well as the plan for the location of the needle insertion were confirmed.       Fluoroscopy was utilized to delineate the anatomy of the lumbar spine. Surface landmarks were identified as well.  After prep and drape, an oblique fluoroscopic view was created to optimize visualization of the junction of the transverse process and the pedicle.      After infiltration of local, # 20-gauge 100-mm 10- mm active tip radiofrequency ablation needle was passed to position the tip at the junction of the superior articular process and the transverse process, along the course of the medial branch.  Satisfactory needle placement was  confirmed by AP, lateral and oblique projections.     At the sacral ala level, the sacral alar region was visualized and the needle tip was positioned on the sacral ala at the base of the superior articulating process where the medial branch traverses.  Satisfactory needle placement was confirmed by AP, lateral and oblique projections.  Sensory and motor testing was then performed.  She  then received 0.6 cc of 2% PF xylocaine at L3,  L4 and L5 and 1cc of 2% PF xylocaine at the sacral alar level      Radiofrequency thermal ablation was then performed at 80 degree Celsius for 90   seconds.  Lauren Hull was comfortable throughout the ablation. No complications were noted.    In summary, medial branch neurolysis were performed at the following vertebral levels; left L3, L4, L5 and sacral alar.   Negative aspiration was noted prior to each injection.  The needle was removed intact and a Band-Aids were applied.    Lauren Hull was transferred to the recovery area. She was monitored, reassessed and discharged after an appropriate observatory period.  No complications were noted.    Lauren Hull will [facility] as [facility].  She was encouraged to call with questions, concerns or if worsening of symptoms occurs.    Manon HildingJafri Ebelyn Bohnet, M.D.

## 2016-02-06 NOTE — Anesthesia Post-Procedure Evaluation (Signed)
Department of Anesthesiology  Post-Anesthesia Note    Name:  Lauren BuffaloKimberly Hull                                         Age:  50 y.o.  MRN:  [id number]     Last Vitals:  BP (!) 111/58   Pulse 59   Temp 98 ??F (36.7 ??C) (Oral)    Resp 16   Ht 5\' 7"  (1.702 m)   Wt 219 lb 12.8 oz (99.7 kg)   SpO2 96%   BMI 34.43 kg/m2  Patient Vitals for the past 4 hrs:   BP Temp Temp src Pulse Resp SpO2 Height Weight   02/06/16 1009 (!) 111/58 - - 59 16 96 % - -   02/06/16 0852 127/64 98 ??F (36.7 ??C) Oral 67 16 98 % 5\' 7"  (1.702 m) 219 lb 12.8 oz (99.7 kg)       Level of Consciousness:  Awake    Respiratory:  Stable    Oxygen Saturation:  Stable    Cardiovascular:  Stable    Hydration:  Adequate    PONV:  Stable    Post-op Pain:  Adequate analgesia    Post-op Assessment:  No apparent anesthetic complications    Additional Follow-Up / Treatment / Comment:  None    Lauren Holdren Vergie LivingWILLIAM Muslima Toppins, MD  February 06, 2016   10:23 AM  Anesthesia Post Evaluation    Jordy Verba Vergie LivingWILLIAM Oree Mirelez, MD  10:22 AM

## 2016-04-08 DIAGNOSIS — K642 Third degree hemorrhoids: Secondary | ICD-10-CM | POA: Diagnosis not present

## 2016-04-08 DIAGNOSIS — K648 Other hemorrhoids: Secondary | ICD-10-CM | POA: Diagnosis not present

## 2016-04-08 DIAGNOSIS — Z1211 Encounter for screening for malignant neoplasm of colon: Secondary | ICD-10-CM | POA: Diagnosis not present

## 2016-04-10 DIAGNOSIS — Z23 Encounter for immunization: Secondary | ICD-10-CM | POA: Diagnosis not present

## 2016-04-10 DIAGNOSIS — F319 Bipolar disorder, unspecified: Secondary | ICD-10-CM | POA: Diagnosis not present

## 2016-04-10 DIAGNOSIS — S76211A Strain of adductor muscle, fascia and tendon of right thigh, initial encounter: Secondary | ICD-10-CM | POA: Diagnosis not present

## 2016-04-25 DIAGNOSIS — M531 Cervicobrachial syndrome: Secondary | ICD-10-CM | POA: Diagnosis not present

## 2016-04-25 DIAGNOSIS — M9901 Segmental and somatic dysfunction of cervical region: Secondary | ICD-10-CM | POA: Diagnosis not present

## 2016-04-25 DIAGNOSIS — M9902 Segmental and somatic dysfunction of thoracic region: Secondary | ICD-10-CM | POA: Diagnosis not present

## 2016-04-25 DIAGNOSIS — M791 Myalgia: Secondary | ICD-10-CM | POA: Diagnosis not present

## 2016-04-26 DIAGNOSIS — M791 Myalgia: Secondary | ICD-10-CM | POA: Diagnosis not present

## 2016-04-26 DIAGNOSIS — M9901 Segmental and somatic dysfunction of cervical region: Secondary | ICD-10-CM | POA: Diagnosis not present

## 2016-04-26 DIAGNOSIS — M9902 Segmental and somatic dysfunction of thoracic region: Secondary | ICD-10-CM | POA: Diagnosis not present

## 2016-04-26 DIAGNOSIS — M531 Cervicobrachial syndrome: Secondary | ICD-10-CM | POA: Diagnosis not present

## 2016-04-29 DIAGNOSIS — M9902 Segmental and somatic dysfunction of thoracic region: Secondary | ICD-10-CM | POA: Diagnosis not present

## 2016-04-29 DIAGNOSIS — M531 Cervicobrachial syndrome: Secondary | ICD-10-CM | POA: Diagnosis not present

## 2016-04-29 DIAGNOSIS — M791 Myalgia: Secondary | ICD-10-CM | POA: Diagnosis not present

## 2016-04-29 DIAGNOSIS — M9901 Segmental and somatic dysfunction of cervical region: Secondary | ICD-10-CM | POA: Diagnosis not present

## 2016-05-02 DIAGNOSIS — M531 Cervicobrachial syndrome: Secondary | ICD-10-CM | POA: Diagnosis not present

## 2016-05-02 DIAGNOSIS — M9901 Segmental and somatic dysfunction of cervical region: Secondary | ICD-10-CM | POA: Diagnosis not present

## 2016-05-02 DIAGNOSIS — M9902 Segmental and somatic dysfunction of thoracic region: Secondary | ICD-10-CM | POA: Diagnosis not present

## 2016-05-02 DIAGNOSIS — M791 Myalgia: Secondary | ICD-10-CM | POA: Diagnosis not present

## 2016-05-06 DIAGNOSIS — M9902 Segmental and somatic dysfunction of thoracic region: Secondary | ICD-10-CM | POA: Diagnosis not present

## 2016-05-06 DIAGNOSIS — M531 Cervicobrachial syndrome: Secondary | ICD-10-CM | POA: Diagnosis not present

## 2016-05-06 DIAGNOSIS — M791 Myalgia: Secondary | ICD-10-CM | POA: Diagnosis not present

## 2016-05-06 DIAGNOSIS — M9901 Segmental and somatic dysfunction of cervical region: Secondary | ICD-10-CM | POA: Diagnosis not present

## 2016-05-20 DIAGNOSIS — N951 Menopausal and female climacteric states: Secondary | ICD-10-CM | POA: Diagnosis not present

## 2016-05-20 DIAGNOSIS — E041 Nontoxic single thyroid nodule: Secondary | ICD-10-CM | POA: Diagnosis not present

## 2016-05-20 DIAGNOSIS — L659 Nonscarring hair loss, unspecified: Secondary | ICD-10-CM | POA: Diagnosis not present

## 2016-05-20 DIAGNOSIS — R195 Other fecal abnormalities: Secondary | ICD-10-CM | POA: Diagnosis not present

## 2016-05-20 DIAGNOSIS — H04123 Dry eye syndrome of bilateral lacrimal glands: Secondary | ICD-10-CM | POA: Diagnosis not present

## 2016-05-20 DIAGNOSIS — E039 Hypothyroidism, unspecified: Secondary | ICD-10-CM | POA: Diagnosis not present

## 2016-05-20 DIAGNOSIS — N898 Other specified noninflammatory disorders of vagina: Secondary | ICD-10-CM | POA: Diagnosis not present

## 2016-05-20 DIAGNOSIS — Z833 Family history of diabetes mellitus: Secondary | ICD-10-CM | POA: Diagnosis not present

## 2016-05-20 DIAGNOSIS — Z131 Encounter for screening for diabetes mellitus: Secondary | ICD-10-CM | POA: Diagnosis not present

## 2016-05-20 DIAGNOSIS — D352 Benign neoplasm of pituitary gland: Secondary | ICD-10-CM | POA: Diagnosis not present

## 2016-05-20 DIAGNOSIS — R109 Unspecified abdominal pain: Secondary | ICD-10-CM | POA: Diagnosis not present

## 2016-05-20 DIAGNOSIS — R682 Dry mouth, unspecified: Secondary | ICD-10-CM | POA: Diagnosis not present

## 2016-05-21 ENCOUNTER — Other Ambulatory Visit: Payer: Self-pay | Admitting: Internal Medicine

## 2016-05-21 DIAGNOSIS — D352 Benign neoplasm of pituitary gland: Secondary | ICD-10-CM

## 2016-05-21 DIAGNOSIS — E041 Nontoxic single thyroid nodule: Secondary | ICD-10-CM

## 2016-05-30 ENCOUNTER — Ambulatory Visit
Admission: RE | Admit: 2016-05-30 | Discharge: 2016-05-30 | Disposition: A | Discharge status: Home / Self Care | Payer: Federal, State, Local not specified - PPO | Source: Ambulatory Visit | Attending: Internal Medicine | Admitting: Internal Medicine

## 2016-05-30 DIAGNOSIS — E041 Nontoxic single thyroid nodule: Secondary | ICD-10-CM | POA: Diagnosis not present

## 2016-05-31 ENCOUNTER — Ambulatory Visit
Admission: RE | Admit: 2016-05-31 | Discharge: 2016-05-31 | Disposition: A | Discharge status: Home / Self Care | Payer: Federal, State, Local not specified - PPO | Source: Ambulatory Visit | Attending: Internal Medicine | Admitting: Internal Medicine

## 2016-05-31 DIAGNOSIS — D352 Benign neoplasm of pituitary gland: Secondary | ICD-10-CM | POA: Diagnosis not present

## 2016-05-31 MED ORDER — GADOBENATE DIMEGLUMINE 529 MG/ML IV SOLN
10.0000 mL | Freq: Once | INTRAVENOUS | Status: AC | PRN
  Administered 2016-05-31: 7 mL via INTRAVENOUS

## 2016-07-01 DIAGNOSIS — E039 Hypothyroidism, unspecified: Secondary | ICD-10-CM | POA: Diagnosis not present

## 2016-07-23 DIAGNOSIS — K080 Exfoliation of teeth due to systemic causes: Secondary | ICD-10-CM | POA: Diagnosis not present

## 2016-08-20 DIAGNOSIS — E039 Hypothyroidism, unspecified: Secondary | ICD-10-CM | POA: Diagnosis not present

## 2016-08-20 DIAGNOSIS — E041 Nontoxic single thyroid nodule: Secondary | ICD-10-CM | POA: Diagnosis not present

## 2016-08-20 DIAGNOSIS — K5900 Constipation, unspecified: Secondary | ICD-10-CM | POA: Diagnosis not present

## 2016-08-20 DIAGNOSIS — D352 Benign neoplasm of pituitary gland: Secondary | ICD-10-CM | POA: Diagnosis not present

## 2016-09-22 DIAGNOSIS — M791 Myalgia: Secondary | ICD-10-CM | POA: Diagnosis not present

## 2016-09-22 DIAGNOSIS — M9902 Segmental and somatic dysfunction of thoracic region: Secondary | ICD-10-CM | POA: Diagnosis not present

## 2016-09-22 DIAGNOSIS — M531 Cervicobrachial syndrome: Secondary | ICD-10-CM | POA: Diagnosis not present

## 2016-09-22 DIAGNOSIS — M9901 Segmental and somatic dysfunction of cervical region: Secondary | ICD-10-CM | POA: Diagnosis not present

## 2016-10-01 DIAGNOSIS — E039 Hypothyroidism, unspecified: Secondary | ICD-10-CM | POA: Diagnosis not present

## 2016-10-01 DIAGNOSIS — E785 Hyperlipidemia, unspecified: Secondary | ICD-10-CM | POA: Diagnosis not present

## 2016-10-22 DIAGNOSIS — R1013 Epigastric pain: Secondary | ICD-10-CM | POA: Diagnosis not present

## 2016-10-22 DIAGNOSIS — R140 Abdominal distension (gaseous): Secondary | ICD-10-CM | POA: Diagnosis not present

## 2016-10-22 DIAGNOSIS — R634 Abnormal weight loss: Secondary | ICD-10-CM | POA: Diagnosis not present

## 2016-11-17 DIAGNOSIS — M9901 Segmental and somatic dysfunction of cervical region: Secondary | ICD-10-CM | POA: Diagnosis not present

## 2016-11-17 DIAGNOSIS — M791 Myalgia: Secondary | ICD-10-CM | POA: Diagnosis not present

## 2016-11-17 DIAGNOSIS — M531 Cervicobrachial syndrome: Secondary | ICD-10-CM | POA: Diagnosis not present

## 2016-11-17 DIAGNOSIS — M9902 Segmental and somatic dysfunction of thoracic region: Secondary | ICD-10-CM | POA: Diagnosis not present

## 2016-12-10 ENCOUNTER — Other Ambulatory Visit: Payer: Self-pay | Admitting: Physician Assistant

## 2016-12-10 DIAGNOSIS — Z1231 Encounter for screening mammogram for malignant neoplasm of breast: Secondary | ICD-10-CM

## 2016-12-19 ENCOUNTER — Ambulatory Visit
Admission: RE | Admit: 2016-12-19 | Discharge: 2016-12-19 | Disposition: A | Discharge status: Home / Self Care | Payer: Federal, State, Local not specified - PPO | Source: Ambulatory Visit | Attending: Physician Assistant | Admitting: Physician Assistant

## 2016-12-19 DIAGNOSIS — Z1231 Encounter for screening mammogram for malignant neoplasm of breast: Secondary | ICD-10-CM

## 2016-12-22 DIAGNOSIS — M791 Myalgia: Secondary | ICD-10-CM | POA: Diagnosis not present

## 2016-12-22 DIAGNOSIS — M531 Cervicobrachial syndrome: Secondary | ICD-10-CM | POA: Diagnosis not present

## 2016-12-22 DIAGNOSIS — M9901 Segmental and somatic dysfunction of cervical region: Secondary | ICD-10-CM | POA: Diagnosis not present

## 2016-12-22 DIAGNOSIS — M9902 Segmental and somatic dysfunction of thoracic region: Secondary | ICD-10-CM | POA: Diagnosis not present

## 2017-01-12 ENCOUNTER — Encounter (HOSPITAL_BASED_OUTPATIENT_CLINIC_OR_DEPARTMENT_OTHER): Payer: Self-pay

## 2017-01-12 ENCOUNTER — Other Ambulatory Visit: Payer: Self-pay | Admitting: Family Medicine

## 2017-01-12 ENCOUNTER — Ambulatory Visit (HOSPITAL_BASED_OUTPATIENT_CLINIC_OR_DEPARTMENT_OTHER)
Admission: RE | Admit: 2017-01-12 | Discharge: 2017-01-12 | Disposition: A | Discharge status: Home / Self Care | Payer: Federal, State, Local not specified - PPO | Source: Ambulatory Visit | Attending: Family Medicine | Admitting: Family Medicine

## 2017-01-12 DIAGNOSIS — Z90710 Acquired absence of both cervix and uterus: Secondary | ICD-10-CM | POA: Diagnosis not present

## 2017-01-12 DIAGNOSIS — R197 Diarrhea, unspecified: Secondary | ICD-10-CM

## 2017-01-12 DIAGNOSIS — R109 Unspecified abdominal pain: Secondary | ICD-10-CM | POA: Diagnosis not present

## 2017-01-12 DIAGNOSIS — R1084 Generalized abdominal pain: Secondary | ICD-10-CM

## 2017-01-12 DIAGNOSIS — I700 Atherosclerosis of aorta: Secondary | ICD-10-CM | POA: Insufficient documentation

## 2017-01-12 DIAGNOSIS — Z9049 Acquired absence of other specified parts of digestive tract: Secondary | ICD-10-CM | POA: Diagnosis not present

## 2017-01-12 DIAGNOSIS — R110 Nausea: Secondary | ICD-10-CM | POA: Diagnosis not present

## 2017-01-12 DIAGNOSIS — R1013 Epigastric pain: Secondary | ICD-10-CM | POA: Diagnosis not present

## 2017-01-12 MED ORDER — IOPAMIDOL (ISOVUE-300) INJECTION 61%
100.0000 mL | Freq: Once | INTRAVENOUS | Status: AC | PRN
  Administered 2017-01-12: 100 mL via INTRAVENOUS

## 2017-01-13 DIAGNOSIS — R1013 Epigastric pain: Secondary | ICD-10-CM | POA: Diagnosis not present

## 2017-01-13 DIAGNOSIS — R110 Nausea: Secondary | ICD-10-CM | POA: Diagnosis not present

## 2017-01-13 DIAGNOSIS — R197 Diarrhea, unspecified: Secondary | ICD-10-CM | POA: Diagnosis not present

## 2017-01-15 DIAGNOSIS — R195 Other fecal abnormalities: Secondary | ICD-10-CM | POA: Diagnosis not present

## 2017-01-15 DIAGNOSIS — R748 Abnormal levels of other serum enzymes: Secondary | ICD-10-CM | POA: Diagnosis not present

## 2017-01-15 DIAGNOSIS — R7989 Other specified abnormal findings of blood chemistry: Secondary | ICD-10-CM | POA: Diagnosis not present

## 2017-01-15 DIAGNOSIS — K8689 Other specified diseases of pancreas: Secondary | ICD-10-CM | POA: Diagnosis not present

## 2017-01-16 DIAGNOSIS — R300 Dysuria: Secondary | ICD-10-CM | POA: Diagnosis not present

## 2017-01-22 DIAGNOSIS — K080 Exfoliation of teeth due to systemic causes: Secondary | ICD-10-CM | POA: Diagnosis not present

## 2017-02-02 DIAGNOSIS — M531 Cervicobrachial syndrome: Secondary | ICD-10-CM | POA: Diagnosis not present

## 2017-02-02 DIAGNOSIS — M9901 Segmental and somatic dysfunction of cervical region: Secondary | ICD-10-CM | POA: Diagnosis not present

## 2017-02-02 DIAGNOSIS — M791 Myalgia: Secondary | ICD-10-CM | POA: Diagnosis not present

## 2017-02-02 DIAGNOSIS — M9902 Segmental and somatic dysfunction of thoracic region: Secondary | ICD-10-CM | POA: Diagnosis not present

## 2017-02-22 IMAGING — MR MR HEAD WO/W CM
19 series · 45 of 48 positions shown · IV contrast (multihance)
Comparison: None.

CLINICAL DATA: Followup pituitary tumor. Confusion, hearing loss
and visual disturbance.

EXAM:
MRI HEAD WITHOUT AND WITH CONTRAST
TECHNIQUE: Multiplanar, multiecho pulse sequences of the brain and surrounding
structures were obtained without and with intravenous contrast.
CONTRAST:  7mL MULTIHANCE GADOBENATE DIMEGLUMINE 529 MG/ML IV SOLN

[Series 5: T1 · sagittal · 4.0mm · 0.72mm/px · 1 of 27 slices shown (1 of 5)]
[im 1/27]
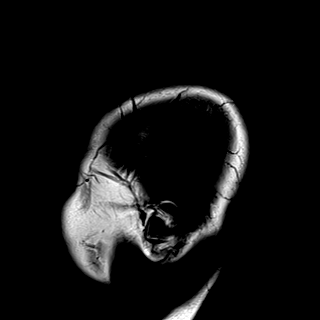

[Series 6: DWI · axial · 3.0mm · 1.44mm/px · z∈[-81,+75]mm · 6 of 84 slices shown (1 of 2)]
[im 1/84]
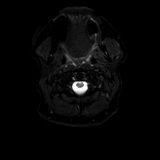
[im 17/84]
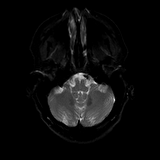
[im 34/84]
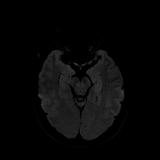
[im 50/84]
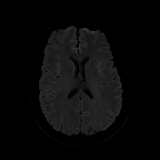
[im 67/84]
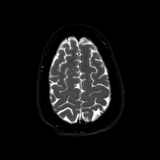
[im 84/84]
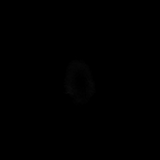

[Series 7: DWI · axial · 3.0mm · 1.44mm/px · z∈[-81,+75]mm · 4 of 42 slices shown (2 of 2)]
[im 1/42]
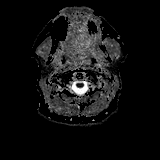
[im 14/42]
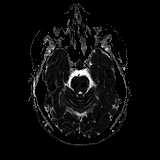
[im 28/42]
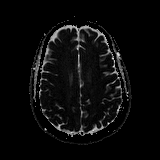
[im 42/42]
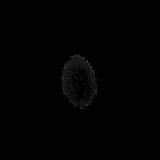

[Series 8: T2 · axial · 4.0mm · 0.36mm/px · z∈[-65,+80]mm · 3 of 29 slices shown]
[im 1/29]
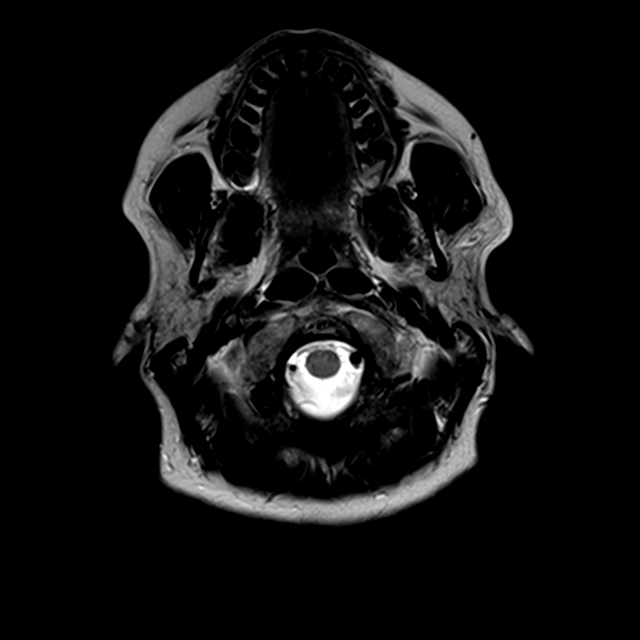
[im 15/29]
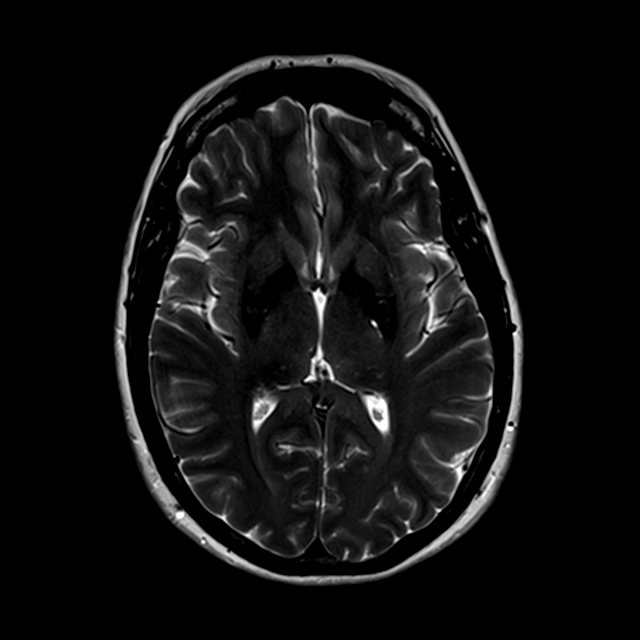
[im 29/29]
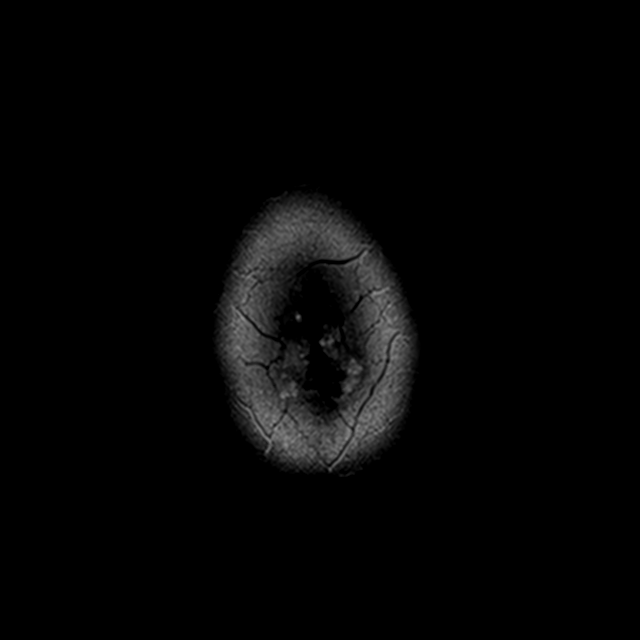

[Series 9: GRE · axial · 4.0mm · 0.69mm/px · z∈[-64,+80]mm · 3 of 29 slices shown]
[im 1/29]
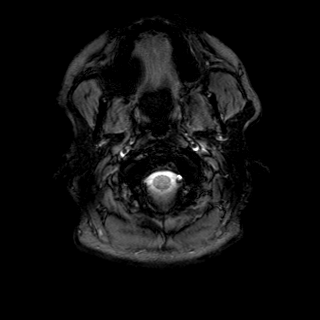
[im 15/29]
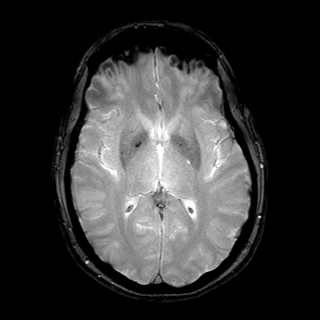
[im 29/29]
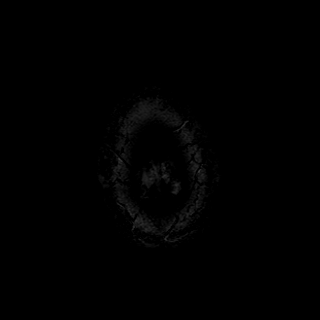

[Series 10: FLAIR · axial · 4.0mm · 0.72mm/px · z∈[-65,+80]mm · 3 of 29 slices shown]
[im 1/29]
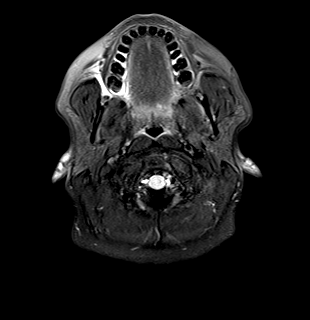
[im 15/29]
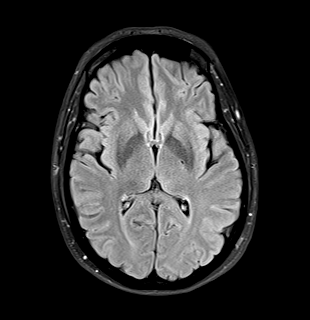
[im 29/29]
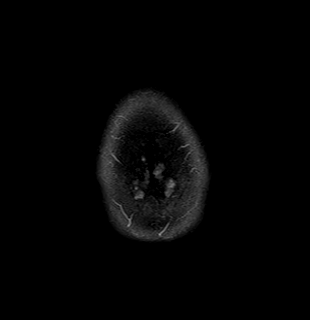

[Series 11: T1 · sagittal · 3.0mm · 0.42mm/px · 1 of 13 slices shown (2 of 5)]
[im 1/13]
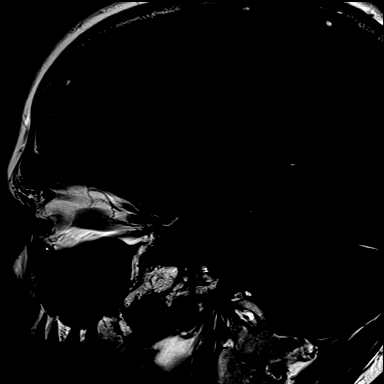

[Series 12: T1 · coronal · 3.0mm · 0.42mm/px · 1 of 10 slices shown (3 of 5)]
[im 1/10]
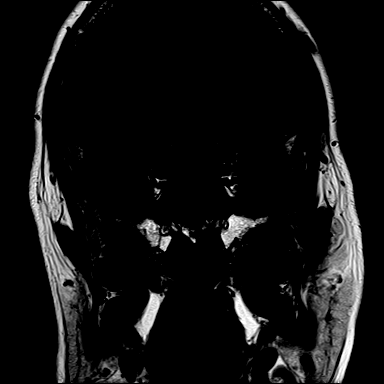

[Series 13: T1 · coronal · non-contrast · 3.0mm · 0.62mm/px · 1 of 9 slices shown (4 of 5)]
[im 1/9]
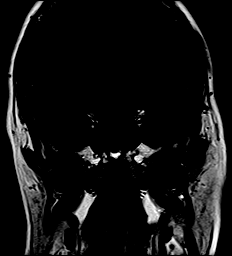

[Series 14: cor post dyn · coronal · 3.0mm · 0.62mm/px · 1 of 9 slices shown (1 of 6)]
[im 1/9]
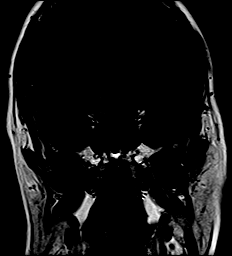

[Series 15: cor post dyn · coronal · 3.0mm · 0.62mm/px · 1 of 9 slices shown (2 of 6)]
[im 1/9]
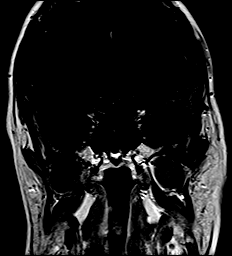

[Series 16: cor post dyn · coronal · 3.0mm · 0.62mm/px · 1 of 9 slices shown (3 of 6)]
[im 1/9]
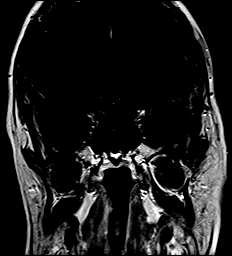

[Series 17: cor post dyn · coronal · 3.0mm · 0.62mm/px · 1 of 9 slices shown (4 of 6)]
[im 1/9]
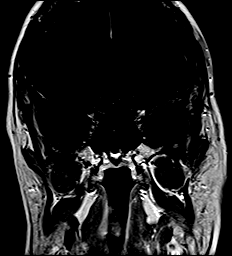

[Series 18: cor post dyn · coronal · 3.0mm · 0.62mm/px · 1 of 9 slices shown (5 of 6)]
[im 1/9]
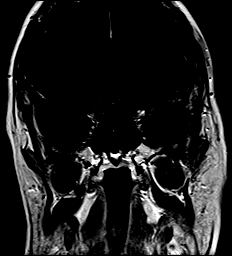

[Series 19: cor post dyn · coronal · 3.0mm · 0.62mm/px · 1 of 9 slices shown (6 of 6)]
[im 1/9]
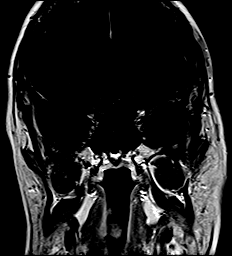

[Series 20: T1 post-contrast · sagittal · 3.0mm · 0.42mm/px · 1 of 13 slices shown (1 of 3)]
[im 1/13]
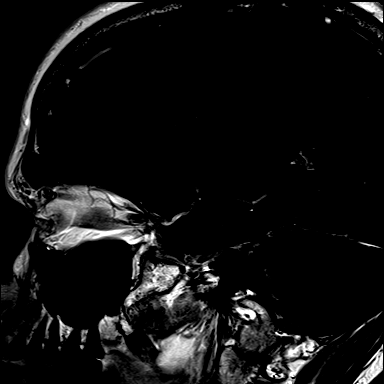

[Series 21: T1 post-contrast · coronal · 3.0mm · 0.42mm/px · 1 of 10 slices shown (2 of 3)]
[im 1/10]
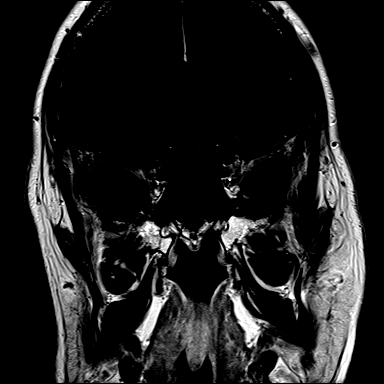

[Series 22: T1 · axial · 1.0mm · 0.86mm/px · z∈[-79,+77]mm · 11 of 160 slices shown (5 of 5)]
[im 1/160]
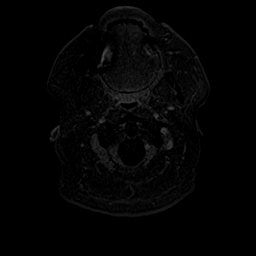
[im 13/160]
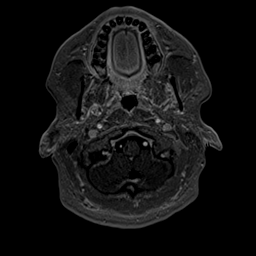
[im 25/160]
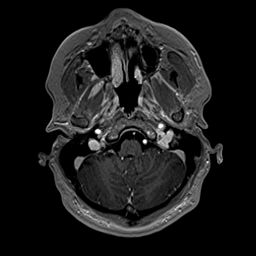
[im 37/160]
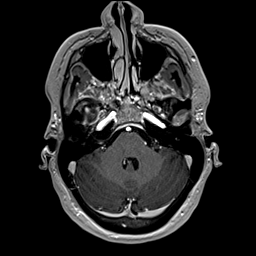
[im 49/160]
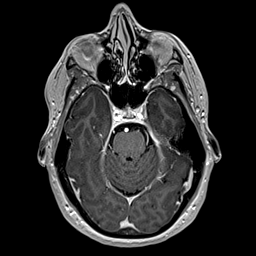
[im 62/160]
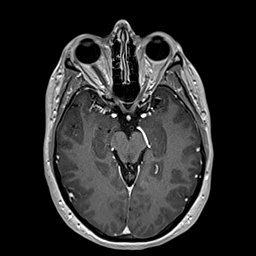
[im 74/160]
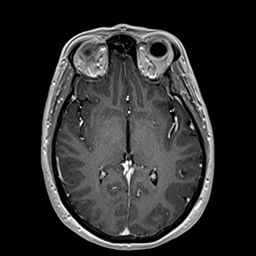
[im 86/160]
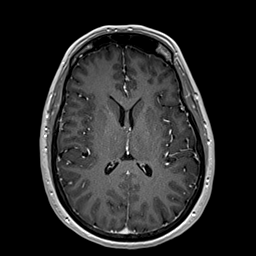
[im 111/160]
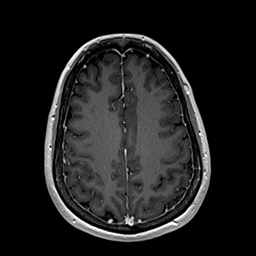
[im 135/160]
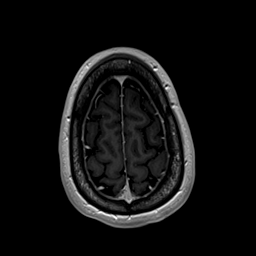
[im 160/160]
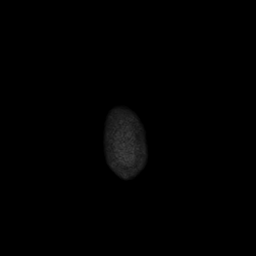

[Series 23: T1 post-contrast · coronal · 4.0mm · 0.47mm/px · 3 of 34 slices shown (3 of 3)]
[im 1/34]
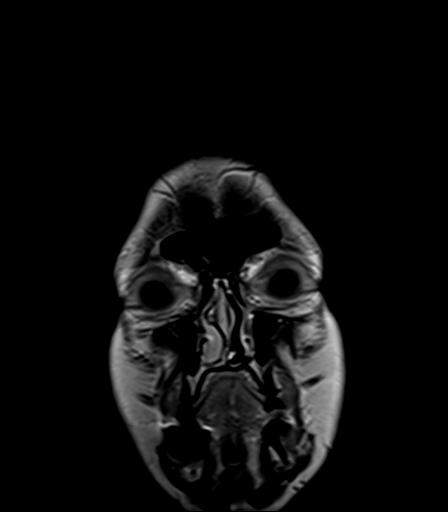
[im 17/34]
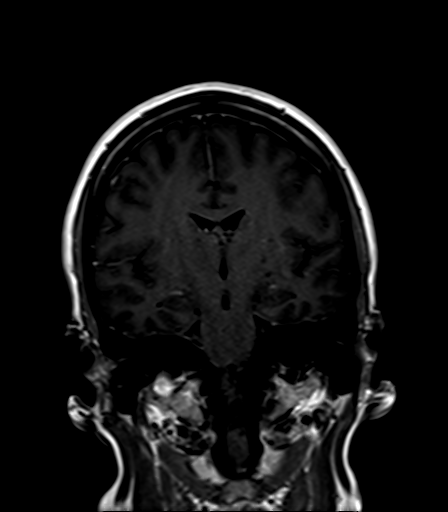
[im 34/34]
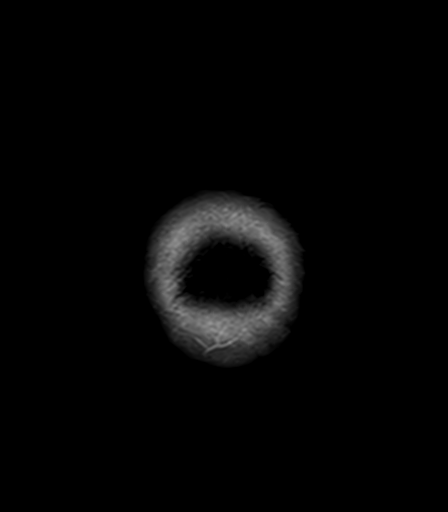

[45 of 48 positions shown; findings below may reference images not displayed]

FINDINGS: Brain: Diffusion imaging does not show any acute or subacute
infarction. The brainstem and cerebellum are normal. Cerebral
hemispheres show scattered punctate foci of T2 and FLAIR signal
within the subcortical white matter. These are nonspecific and could
represent an early manifestation of small vessel change. The
differential diagnosis includes migraine related foci, previous
vasculitis, previous trauma and demyelinating disease. No cortical
or large vessel territory infarction. No mass lesion, hemorrhage,
hydrocephalus or extra-axial collection.

The pituitary gland is within normal limits in size and shape.
Measurements are 9 x 8 x 14 mm. The upper surface is slightly
convex. Right side of the gland is slightly larger than the left,
but this can be seen in normal patients. The gland enhances in a
homogeneous and normal fashion.

Vascular: Major vessels at the base of the brain show flow.

Skull and upper cervical spine: Negative

Sinuses/Orbits: Clear/normal

Other: None significant
IMPRESSION: No definite pituitary mass. The gland is normal in size. Right side
of the gland is slightly more prominent than the left, but the gland
enhances in a normal and homogeneous fashion. I would interpret this
study as normal, but it is true that there could possibly be a small
adenoma in the right side of the gland.

No acute brain finding. Scattered punctate subcortical white matter
foci. See above discussion.

## 2017-02-24 DIAGNOSIS — D352 Benign neoplasm of pituitary gland: Secondary | ICD-10-CM | POA: Diagnosis not present

## 2017-02-24 DIAGNOSIS — E041 Nontoxic single thyroid nodule: Secondary | ICD-10-CM | POA: Diagnosis not present

## 2017-02-24 DIAGNOSIS — E039 Hypothyroidism, unspecified: Secondary | ICD-10-CM | POA: Diagnosis not present

## 2017-02-24 DIAGNOSIS — Z833 Family history of diabetes mellitus: Secondary | ICD-10-CM | POA: Diagnosis not present

## 2017-02-27 DIAGNOSIS — Z5181 Encounter for therapeutic drug level monitoring: Secondary | ICD-10-CM | POA: Diagnosis not present

## 2017-02-27 DIAGNOSIS — E039 Hypothyroidism, unspecified: Secondary | ICD-10-CM | POA: Diagnosis not present

## 2017-03-07 LAB — BASIC METABOLIC PANEL
Anion Gap: 11 mmol/L (ref 7–16)
BUN: 10 mg/dL (ref 6–20)
CO2: 25 mmol/L (ref 22–29)
Calcium: 9.2 mg/dL (ref 8.6–10.2)
Chloride: 103 mmol/L (ref 98–107)
Creatinine: 0.7 mg/dL (ref 0.5–1.0)
GFR African American: >60
GFR Non-African American: >60 mL/min/{1.73_m2} (ref >=60)
Glucose: 167 mg/dL — ABNORMAL HIGH (ref 74–109)
Potassium: 4.1 mmol/L (ref 3.5–5.0)
Sodium: 139 mmol/L (ref 132–146)

## 2017-03-07 LAB — CBC
Hematocrit: 33.8 % — ABNORMAL LOW (ref 34.0–48.0)
Hemoglobin: 10.6 g/dL — ABNORMAL LOW (ref 11.5–15.5)
MCH: 28.5 pg (ref 26.0–35.0)
MCHC: 31.4 % — ABNORMAL LOW (ref 32.0–34.5)
MCV: 90.9 fL (ref 80.0–99.9)
MPV: 10.8 fL (ref 7.0–12.0)
Platelets: 290 E9/L (ref 130–450)
RBC: 3.72 E12/L (ref 3.50–5.50)
RDW: 13.4 fL (ref 11.5–15.0)
WBC: 17.4 E9/L — ABNORMAL HIGH (ref 4.5–11.5)

## 2017-03-30 DIAGNOSIS — M9902 Segmental and somatic dysfunction of thoracic region: Secondary | ICD-10-CM | POA: Diagnosis not present

## 2017-03-30 DIAGNOSIS — M9901 Segmental and somatic dysfunction of cervical region: Secondary | ICD-10-CM | POA: Diagnosis not present

## 2017-03-30 DIAGNOSIS — M791 Myalgia, unspecified site: Secondary | ICD-10-CM | POA: Diagnosis not present

## 2017-03-30 DIAGNOSIS — M531 Cervicobrachial syndrome: Secondary | ICD-10-CM | POA: Diagnosis not present

## 2017-04-27 DIAGNOSIS — R1013 Epigastric pain: Secondary | ICD-10-CM | POA: Diagnosis not present

## 2017-04-27 DIAGNOSIS — K861 Other chronic pancreatitis: Secondary | ICD-10-CM | POA: Diagnosis not present

## 2017-05-01 DIAGNOSIS — E878 Other disorders of electrolyte and fluid balance, not elsewhere classified: Secondary | ICD-10-CM | POA: Diagnosis not present

## 2017-05-01 DIAGNOSIS — Z23 Encounter for immunization: Secondary | ICD-10-CM | POA: Diagnosis not present

## 2017-05-18 DIAGNOSIS — R350 Frequency of micturition: Secondary | ICD-10-CM | POA: Diagnosis not present

## 2017-05-27 DIAGNOSIS — M9901 Segmental and somatic dysfunction of cervical region: Secondary | ICD-10-CM | POA: Diagnosis not present

## 2017-05-27 DIAGNOSIS — M9902 Segmental and somatic dysfunction of thoracic region: Secondary | ICD-10-CM | POA: Diagnosis not present

## 2017-05-27 DIAGNOSIS — M791 Myalgia, unspecified site: Secondary | ICD-10-CM | POA: Diagnosis not present

## 2017-05-27 DIAGNOSIS — M531 Cervicobrachial syndrome: Secondary | ICD-10-CM | POA: Diagnosis not present

## 2017-06-18 DIAGNOSIS — F419 Anxiety disorder, unspecified: Secondary | ICD-10-CM | POA: Diagnosis not present

## 2017-06-18 DIAGNOSIS — R002 Palpitations: Secondary | ICD-10-CM | POA: Diagnosis not present

## 2017-06-18 DIAGNOSIS — F319 Bipolar disorder, unspecified: Secondary | ICD-10-CM | POA: Diagnosis not present

## 2017-07-14 ENCOUNTER — Other Ambulatory Visit (HOSPITAL_COMMUNITY): Payer: Self-pay | Admitting: Gastroenterology

## 2017-07-14 DIAGNOSIS — R748 Abnormal levels of other serum enzymes: Secondary | ICD-10-CM | POA: Diagnosis not present

## 2017-07-14 DIAGNOSIS — R112 Nausea with vomiting, unspecified: Secondary | ICD-10-CM | POA: Diagnosis not present

## 2017-07-14 DIAGNOSIS — R140 Abdominal distension (gaseous): Secondary | ICD-10-CM | POA: Diagnosis not present

## 2017-07-23 ENCOUNTER — Ambulatory Visit (HOSPITAL_COMMUNITY)
Admission: RE | Admit: 2017-07-23 | Discharge: 2017-07-23 | Disposition: A | Discharge status: Home / Self Care | Payer: Federal, State, Local not specified - PPO | Source: Ambulatory Visit | Attending: Gastroenterology | Admitting: Gastroenterology

## 2017-07-23 DIAGNOSIS — R112 Nausea with vomiting, unspecified: Secondary | ICD-10-CM | POA: Insufficient documentation

## 2017-07-23 MED ORDER — TECHNETIUM TC 99M SULFUR COLLOID
1.2000 | Freq: Once | INTRAVENOUS | Status: AC | PRN
  Administered 2017-07-23: 1.2 via INTRAVENOUS

## 2017-07-31 DIAGNOSIS — M791 Myalgia, unspecified site: Secondary | ICD-10-CM | POA: Diagnosis not present

## 2017-07-31 DIAGNOSIS — M9901 Segmental and somatic dysfunction of cervical region: Secondary | ICD-10-CM | POA: Diagnosis not present

## 2017-07-31 DIAGNOSIS — M531 Cervicobrachial syndrome: Secondary | ICD-10-CM | POA: Diagnosis not present

## 2017-07-31 DIAGNOSIS — M9902 Segmental and somatic dysfunction of thoracic region: Secondary | ICD-10-CM | POA: Diagnosis not present

## 2017-08-26 DIAGNOSIS — R197 Diarrhea, unspecified: Secondary | ICD-10-CM | POA: Diagnosis not present

## 2017-09-23 DIAGNOSIS — M9902 Segmental and somatic dysfunction of thoracic region: Secondary | ICD-10-CM | POA: Diagnosis not present

## 2017-09-23 DIAGNOSIS — M791 Myalgia, unspecified site: Secondary | ICD-10-CM | POA: Diagnosis not present

## 2017-09-23 DIAGNOSIS — M531 Cervicobrachial syndrome: Secondary | ICD-10-CM | POA: Diagnosis not present

## 2017-09-23 DIAGNOSIS — M9901 Segmental and somatic dysfunction of cervical region: Secondary | ICD-10-CM | POA: Diagnosis not present

## 2017-10-21 DIAGNOSIS — H1045 Other chronic allergic conjunctivitis: Secondary | ICD-10-CM | POA: Diagnosis not present

## 2017-11-05 DIAGNOSIS — K529 Noninfective gastroenteritis and colitis, unspecified: Secondary | ICD-10-CM | POA: Diagnosis not present

## 2017-11-05 DIAGNOSIS — E782 Mixed hyperlipidemia: Secondary | ICD-10-CM | POA: Diagnosis not present

## 2017-11-05 DIAGNOSIS — R21 Rash and other nonspecific skin eruption: Secondary | ICD-10-CM | POA: Diagnosis not present

## 2017-11-12 DIAGNOSIS — K080 Exfoliation of teeth due to systemic causes: Secondary | ICD-10-CM | POA: Diagnosis not present

## 2017-11-24 DIAGNOSIS — R21 Rash and other nonspecific skin eruption: Secondary | ICD-10-CM | POA: Diagnosis not present

## 2017-11-24 DIAGNOSIS — K529 Noninfective gastroenteritis and colitis, unspecified: Secondary | ICD-10-CM | POA: Diagnosis not present

## 2017-11-24 DIAGNOSIS — E782 Mixed hyperlipidemia: Secondary | ICD-10-CM | POA: Diagnosis not present

## 2017-11-25 DIAGNOSIS — M9901 Segmental and somatic dysfunction of cervical region: Secondary | ICD-10-CM | POA: Diagnosis not present

## 2017-11-25 DIAGNOSIS — M9902 Segmental and somatic dysfunction of thoracic region: Secondary | ICD-10-CM | POA: Diagnosis not present

## 2017-11-25 DIAGNOSIS — M531 Cervicobrachial syndrome: Secondary | ICD-10-CM | POA: Diagnosis not present

## 2017-11-25 DIAGNOSIS — M791 Myalgia, unspecified site: Secondary | ICD-10-CM | POA: Diagnosis not present

## 2017-11-26 DIAGNOSIS — R197 Diarrhea, unspecified: Secondary | ICD-10-CM | POA: Diagnosis not present

## 2017-11-26 DIAGNOSIS — K649 Unspecified hemorrhoids: Secondary | ICD-10-CM | POA: Diagnosis not present

## 2017-12-25 DIAGNOSIS — M791 Myalgia, unspecified site: Secondary | ICD-10-CM | POA: Diagnosis not present

## 2017-12-25 DIAGNOSIS — M531 Cervicobrachial syndrome: Secondary | ICD-10-CM | POA: Diagnosis not present

## 2017-12-25 DIAGNOSIS — M9901 Segmental and somatic dysfunction of cervical region: Secondary | ICD-10-CM | POA: Diagnosis not present

## 2017-12-25 DIAGNOSIS — M9902 Segmental and somatic dysfunction of thoracic region: Secondary | ICD-10-CM | POA: Diagnosis not present

## 2018-01-20 DIAGNOSIS — M9902 Segmental and somatic dysfunction of thoracic region: Secondary | ICD-10-CM | POA: Diagnosis not present

## 2018-01-20 DIAGNOSIS — M9901 Segmental and somatic dysfunction of cervical region: Secondary | ICD-10-CM | POA: Diagnosis not present

## 2018-01-20 DIAGNOSIS — M531 Cervicobrachial syndrome: Secondary | ICD-10-CM | POA: Diagnosis not present

## 2018-01-20 DIAGNOSIS — M791 Myalgia, unspecified site: Secondary | ICD-10-CM | POA: Diagnosis not present

## 2018-02-02 ENCOUNTER — Other Ambulatory Visit: Payer: Self-pay | Admitting: Physician Assistant

## 2018-02-02 DIAGNOSIS — Z1231 Encounter for screening mammogram for malignant neoplasm of breast: Secondary | ICD-10-CM

## 2018-02-03 ENCOUNTER — Encounter: Payer: Federal, State, Local not specified - PPO | Attending: Family Medicine | Admitting: Skilled Nursing Facility1

## 2018-02-03 ENCOUNTER — Encounter: Payer: Self-pay | Admitting: Skilled Nursing Facility1

## 2018-02-03 DIAGNOSIS — E639 Nutritional deficiency, unspecified: Secondary | ICD-10-CM

## 2018-02-03 DIAGNOSIS — Z713 Dietary counseling and surveillance: Secondary | ICD-10-CM | POA: Diagnosis not present

## 2018-02-03 DIAGNOSIS — R760 Raised antibody titer: Secondary | ICD-10-CM | POA: Diagnosis not present

## 2018-02-03 NOTE — Patient Instructions (Addendum)
-  Do something active 5 days a week for 30 minutes including resistance exercises   -Try flavored kefir in the yogurt aisle drink 4-8 ounces every day for 4-6 weeks  -Try to decrease butter in your diet   Sweet Tooth:   Any fruit  Any fruit dipped in agave, honey, or monk fruit syrup or 80% or more dark chocolate either in a bar form or melted to go on your fruit  Add in raisins or any nut along with your dark chocolate or add in sugar free whip cream

## 2018-02-03 NOTE — Progress Notes (Signed)
  Assessment:  Primary concerns today: wheat allergy.   Pt states she has been having problems with her stomach: pain, gas, and bloat, diarrhea. According to a allergy test gluten came back positive. Pt states she has been in the 140's (pounds) for about 1 year. Pt states since cutting dessert out of her diet she has not had any gut issues. Pt states she wants sweet options that do not give her gut issues. Pt states she has a bowel movement every 2 days. Pt states she really loves butter and eats a lot of it.  Pt states Milk Chocolate, cake cookies seems to be linked to gut symptoms.    MEDICATIONS: See List   DIETARY INTAKE:  Usual eating pattern includes 3 meals and 1 snacks per day.  Everyday foods include none stated.  Avoided foods include suagr.    24-hr recall:  B ( AM): greek yogurt with granola  Snk ( AM): peanuts  L ( PM): salad or peanut butter jelly sandwich  Snk ( PM): corn chips or popcorn D ( PM): pork chops, sweet potato and asparagus  Snk ( PM):  Beverages: water, coffee, sweet tea, kambucha   Usual physical activity: ADL's  Estimated energy needs: 1600 calories 180 g carbohydrates 120 g protein 44 g fat  Progress Towards Goal(s):  In progress.    Intervention:  Nutrition counseling. Dietitian educated the pt on gut health and diet. Goals: -Do something active 5 days a week for 30 minutes including resistance exercises  -Try flavored kefir in the yogurt aisle drink 4-8 ounces every day for 4-6 weeks -Try to decrease butter in your diet  Sweet Tooth:  Any fruit  Any fruit dipped in agave, honey, or monk fruit syrup or 80% or more dark chocolate either in a bar form or melted to go on your fruit  Add in raisins or any nut along with your dark chocolate or add in sugar free whip cream  Teaching Method Utilized:  Visual Auditory Hands on  Barriers to learning/adherence to lifestyle change: none identified   Demonstrated degree of understanding via:  Teach  Back   Monitoring/Evaluation:  Dietary intake, exerciseand body weight prn.

## 2018-02-16 DIAGNOSIS — H00012 Hordeolum externum right lower eyelid: Secondary | ICD-10-CM | POA: Diagnosis not present

## 2018-02-16 DIAGNOSIS — M542 Cervicalgia: Secondary | ICD-10-CM | POA: Diagnosis not present

## 2018-02-17 DIAGNOSIS — M9902 Segmental and somatic dysfunction of thoracic region: Secondary | ICD-10-CM | POA: Diagnosis not present

## 2018-02-17 DIAGNOSIS — M791 Myalgia, unspecified site: Secondary | ICD-10-CM | POA: Diagnosis not present

## 2018-02-17 DIAGNOSIS — M9901 Segmental and somatic dysfunction of cervical region: Secondary | ICD-10-CM | POA: Diagnosis not present

## 2018-02-17 DIAGNOSIS — M531 Cervicobrachial syndrome: Secondary | ICD-10-CM | POA: Diagnosis not present

## 2018-02-22 ENCOUNTER — Ambulatory Visit
Admission: RE | Admit: 2018-02-22 | Discharge: 2018-02-22 | Disposition: A | Discharge status: Home / Self Care | Payer: Federal, State, Local not specified - PPO | Source: Ambulatory Visit | Attending: Family Medicine | Admitting: Family Medicine

## 2018-02-22 ENCOUNTER — Other Ambulatory Visit: Payer: Self-pay | Admitting: Family Medicine

## 2018-02-22 DIAGNOSIS — M542 Cervicalgia: Secondary | ICD-10-CM

## 2018-02-26 ENCOUNTER — Ambulatory Visit
Admission: RE | Admit: 2018-02-26 | Discharge: 2018-02-26 | Disposition: A | Discharge status: Home / Self Care | Payer: Federal, State, Local not specified - PPO | Source: Ambulatory Visit | Attending: Physician Assistant | Admitting: Physician Assistant

## 2018-02-26 DIAGNOSIS — Z1231 Encounter for screening mammogram for malignant neoplasm of breast: Secondary | ICD-10-CM

## 2018-03-02 DIAGNOSIS — E041 Nontoxic single thyroid nodule: Secondary | ICD-10-CM | POA: Diagnosis not present

## 2018-03-02 DIAGNOSIS — E039 Hypothyroidism, unspecified: Secondary | ICD-10-CM | POA: Diagnosis not present

## 2018-03-04 DIAGNOSIS — M542 Cervicalgia: Secondary | ICD-10-CM | POA: Diagnosis not present

## 2018-03-08 DIAGNOSIS — M542 Cervicalgia: Secondary | ICD-10-CM | POA: Diagnosis not present

## 2018-03-10 DIAGNOSIS — M542 Cervicalgia: Secondary | ICD-10-CM | POA: Diagnosis not present

## 2018-03-11 DIAGNOSIS — H00012 Hordeolum externum right lower eyelid: Secondary | ICD-10-CM | POA: Diagnosis not present

## 2018-03-16 DIAGNOSIS — M542 Cervicalgia: Secondary | ICD-10-CM | POA: Diagnosis not present

## 2018-03-19 DIAGNOSIS — M542 Cervicalgia: Secondary | ICD-10-CM | POA: Diagnosis not present

## 2018-03-22 DIAGNOSIS — M542 Cervicalgia: Secondary | ICD-10-CM | POA: Diagnosis not present

## 2018-03-24 DIAGNOSIS — M542 Cervicalgia: Secondary | ICD-10-CM | POA: Diagnosis not present

## 2018-03-29 DIAGNOSIS — M542 Cervicalgia: Secondary | ICD-10-CM | POA: Diagnosis not present

## 2018-03-31 DIAGNOSIS — M542 Cervicalgia: Secondary | ICD-10-CM | POA: Diagnosis not present

## 2018-03-31 DIAGNOSIS — E039 Hypothyroidism, unspecified: Secondary | ICD-10-CM | POA: Diagnosis not present

## 2018-03-31 DIAGNOSIS — L853 Xerosis cutis: Secondary | ICD-10-CM | POA: Diagnosis not present

## 2018-03-31 DIAGNOSIS — L299 Pruritus, unspecified: Secondary | ICD-10-CM | POA: Diagnosis not present

## 2018-04-13 DIAGNOSIS — M542 Cervicalgia: Secondary | ICD-10-CM | POA: Diagnosis not present

## 2018-04-16 DIAGNOSIS — H00012 Hordeolum externum right lower eyelid: Secondary | ICD-10-CM | POA: Diagnosis not present

## 2018-04-19 DIAGNOSIS — L218 Other seborrheic dermatitis: Secondary | ICD-10-CM | POA: Diagnosis not present

## 2018-04-23 DIAGNOSIS — M542 Cervicalgia: Secondary | ICD-10-CM | POA: Diagnosis not present

## 2018-04-29 DIAGNOSIS — F419 Anxiety disorder, unspecified: Secondary | ICD-10-CM | POA: Diagnosis not present

## 2018-04-29 DIAGNOSIS — F3173 Bipolar disorder, in partial remission, most recent episode manic: Secondary | ICD-10-CM | POA: Diagnosis not present

## 2018-04-29 DIAGNOSIS — Z79899 Other long term (current) drug therapy: Secondary | ICD-10-CM | POA: Diagnosis not present

## 2018-04-29 DIAGNOSIS — R41840 Attention and concentration deficit: Secondary | ICD-10-CM | POA: Diagnosis not present

## 2018-05-03 DIAGNOSIS — R0683 Snoring: Secondary | ICD-10-CM | POA: Diagnosis not present

## 2018-05-03 DIAGNOSIS — F515 Nightmare disorder: Secondary | ICD-10-CM | POA: Diagnosis not present

## 2018-05-19 DIAGNOSIS — G4733 Obstructive sleep apnea (adult) (pediatric): Secondary | ICD-10-CM | POA: Diagnosis not present

## 2018-05-20 DIAGNOSIS — G4733 Obstructive sleep apnea (adult) (pediatric): Secondary | ICD-10-CM | POA: Diagnosis not present

## 2018-05-31 DIAGNOSIS — G4733 Obstructive sleep apnea (adult) (pediatric): Secondary | ICD-10-CM | POA: Diagnosis not present

## 2018-06-02 DIAGNOSIS — K080 Exfoliation of teeth due to systemic causes: Secondary | ICD-10-CM | POA: Diagnosis not present

## 2018-06-23 DIAGNOSIS — G4733 Obstructive sleep apnea (adult) (pediatric): Secondary | ICD-10-CM | POA: Diagnosis not present

## 2018-07-01 DIAGNOSIS — G4733 Obstructive sleep apnea (adult) (pediatric): Secondary | ICD-10-CM | POA: Diagnosis not present

## 2018-07-01 LAB — CBC
Hematocrit: 39.1 % (ref 34.0–48.0)
Hemoglobin: 12.2 g/dL (ref 11.5–15.5)
MCH: 29.1 pg (ref 26.0–35.0)
MCHC: 31.2 % — ABNORMAL LOW (ref 32.0–34.5)
MCV: 93.3 fL (ref 80.0–99.9)
MPV: 10.7 fL (ref 7.0–12.0)
Platelets: 302 E9/L (ref 130–450)
RBC: 4.19 E12/L (ref 3.50–5.50)
RDW: 14 fL (ref 11.5–15.0)
WBC: 6.8 E9/L (ref 4.5–11.5)

## 2018-07-01 LAB — MICROSCOPIC URINALYSIS

## 2018-07-01 LAB — BASIC METABOLIC PANEL
Anion Gap: 12 mmol/L (ref 7–16)
BUN: 9 mg/dL (ref 6–20)
CO2: 26 mmol/L (ref 22–29)
Calcium: 9.4 mg/dL (ref 8.6–10.2)
Chloride: 103 mmol/L (ref 98–107)
Creatinine: 0.7 mg/dL (ref 0.5–1.0)
GFR African American: >60
GFR Non-African American: >60 mL/min/{1.73_m2} (ref >=60)
Glucose: 89 mg/dL (ref 74–99)
Potassium: 3.7 mmol/L (ref 3.5–5.0)
Sodium: 141 mmol/L (ref 132–146)

## 2018-07-01 LAB — URINALYSIS
Bilirubin Urine: NEGATIVE
Glucose, Ur: NEGATIVE mg/dL
Ketones, Urine: NEGATIVE mg/dL
Leukocyte Esterase, Urine: NEGATIVE
Nitrite, Urine: NEGATIVE
Protein, UA: NEGATIVE mg/dL
Specific Gravity, UA: <=1.005 (ref 1.005–1.030)
Urobilinogen, Urine: 0.2 E.U./dL (ref <2.0)
pH, UA: 6 (ref 5.0–9.0)

## 2018-07-02 LAB — TYPE AND SCREEN
ABO/Rh: B POS
Antibody Screen: NEGATIVE

## 2018-07-03 LAB — CULTURE, MRSA, SCREENING

## 2018-07-04 LAB — CULTURE, URINE: Urine Culture, Routine: <10000

## 2018-07-10 LAB — BASIC METABOLIC PANEL
Anion Gap: 13 mmol/L (ref 7–16)
BUN: 13 mg/dL (ref 6–20)
CO2: 21 mmol/L — ABNORMAL LOW (ref 22–29)
Calcium: 7.7 mg/dL — ABNORMAL LOW (ref 8.6–10.2)
Chloride: 101 mmol/L (ref 98–107)
Creatinine: 0.8 mg/dL (ref 0.5–1.0)
GFR African American: >60
GFR Non-African American: >60 mL/min/{1.73_m2} (ref >=60)
Glucose: 123 mg/dL — ABNORMAL HIGH (ref 74–99)
Potassium: 4.4 mmol/L (ref 3.5–5.0)
Sodium: 135 mmol/L (ref 132–146)

## 2018-07-10 LAB — CBC
Hematocrit: 29.6 % — ABNORMAL LOW (ref 34.0–48.0)
Hemoglobin: 9 g/dL — ABNORMAL LOW (ref 11.5–15.5)
MCH: 29.1 pg (ref 26.0–35.0)
MCHC: 30.4 % — ABNORMAL LOW (ref 32.0–34.5)
MCV: 95.8 fL (ref 80.0–99.9)
MPV: 11.1 fL (ref 7.0–12.0)
Platelets: 219 E9/L (ref 130–450)
RBC: 3.09 E12/L — ABNORMAL LOW (ref 3.50–5.50)
RDW: 14.3 fL (ref 11.5–15.0)
WBC: 15 E9/L — ABNORMAL HIGH (ref 4.5–11.5)

## 2018-07-19 ENCOUNTER — Emergency Department (HOSPITAL_COMMUNITY): Payer: Federal, State, Local not specified - PPO

## 2018-07-19 ENCOUNTER — Other Ambulatory Visit: Payer: Self-pay

## 2018-07-19 ENCOUNTER — Emergency Department (HOSPITAL_COMMUNITY)
Admission: EM | Admit: 2018-07-19 | Discharge: 2018-07-19 | Disposition: A | Discharge status: Home / Self Care | Payer: Federal, State, Local not specified - PPO | Attending: Emergency Medicine | Admitting: Emergency Medicine

## 2018-07-19 ENCOUNTER — Encounter (HOSPITAL_COMMUNITY): Payer: Self-pay

## 2018-07-19 DIAGNOSIS — Z79899 Other long term (current) drug therapy: Secondary | ICD-10-CM | POA: Diagnosis not present

## 2018-07-19 DIAGNOSIS — R0789 Other chest pain: Secondary | ICD-10-CM

## 2018-07-19 DIAGNOSIS — R51 Headache: Secondary | ICD-10-CM | POA: Insufficient documentation

## 2018-07-19 DIAGNOSIS — R202 Paresthesia of skin: Secondary | ICD-10-CM | POA: Diagnosis not present

## 2018-07-19 DIAGNOSIS — Z87891 Personal history of nicotine dependence: Secondary | ICD-10-CM | POA: Diagnosis not present

## 2018-07-19 DIAGNOSIS — R079 Chest pain, unspecified: Secondary | ICD-10-CM | POA: Diagnosis not present

## 2018-07-19 DIAGNOSIS — M549 Dorsalgia, unspecified: Secondary | ICD-10-CM | POA: Diagnosis not present

## 2018-07-19 DIAGNOSIS — E039 Hypothyroidism, unspecified: Secondary | ICD-10-CM | POA: Insufficient documentation

## 2018-07-19 DIAGNOSIS — M542 Cervicalgia: Secondary | ICD-10-CM | POA: Insufficient documentation

## 2018-07-19 LAB — CBC WITH DIFFERENTIAL/PLATELET
Abs Immature Granulocytes: 0.05 10*3/uL (ref 0.00–0.07)
BASOS ABS: 0 10*3/uL (ref 0.0–0.1)
BASOS PCT: 0 %
EOS ABS: 0.3 10*3/uL (ref 0.0–0.5)
EOS PCT: 4 %
HCT: 39.6 % (ref 36.0–46.0)
Hemoglobin: 13.1 g/dL (ref 12.0–15.0)
Immature Granulocytes: 1 %
LYMPHS PCT: 30 %
Lymphs Abs: 2.3 10*3/uL (ref 0.7–4.0)
MCH: 29.8 pg (ref 26.0–34.0)
MCHC: 33.1 g/dL (ref 30.0–36.0)
MCV: 90.2 fL (ref 80.0–100.0)
MONO ABS: 0.7 10*3/uL (ref 0.1–1.0)
Monocytes Relative: 9 %
NRBC: 0 % (ref 0.0–0.2)
Neutro Abs: 4.2 10*3/uL (ref 1.7–7.7)
Neutrophils Relative %: 56 %
PLATELETS: 204 10*3/uL (ref 150–400)
RBC: 4.39 MIL/uL (ref 3.87–5.11)
RDW: 11.8 % (ref 11.5–15.5)
WBC: 7.6 10*3/uL (ref 4.0–10.5)

## 2018-07-19 LAB — COMPREHENSIVE METABOLIC PANEL
ALBUMIN: 4.1 g/dL (ref 3.5–5.0)
ALT: 32 U/L (ref 0–44)
ANION GAP: 7 (ref 5–15)
AST: 30 U/L (ref 15–41)
Alkaline Phosphatase: 54 U/L (ref 38–126)
BUN: 13 mg/dL (ref 6–20)
CALCIUM: 9.8 mg/dL (ref 8.9–10.3)
CHLORIDE: 107 mmol/L (ref 98–111)
CO2: 28 mmol/L (ref 22–32)
Creatinine, Ser: 0.75 mg/dL (ref 0.44–1.00)
GFR calc non Af Amer: >60 mL/min (ref >60)
GLUCOSE: 96 mg/dL (ref 70–99)
POTASSIUM: 4.1 mmol/L (ref 3.5–5.1)
SODIUM: 142 mmol/L (ref 135–145)
Total Bilirubin: 0.3 mg/dL (ref 0.3–1.2)
Total Protein: 6.8 g/dL (ref 6.5–8.1)

## 2018-07-19 LAB — TROPONIN I: Troponin I: <0.03 ng/mL (ref <0.03)

## 2018-07-19 LAB — I-STAT BETA HCG BLOOD, ED (MC, WL, AP ONLY)

## 2018-07-19 LAB — D-DIMER, QUANTITATIVE (NOT AT ARMC)

## 2018-07-19 LAB — LIPASE, BLOOD: LIPASE: 49 U/L (ref 11–51)

## 2018-07-19 MED ORDER — SUCRALFATE 1 G PO TABS: 1.0000 g | ORAL_TABLET | Freq: Three times a day (TID) | ORAL | 0 refills | Status: DC

## 2018-07-19 MED ORDER — LIDOCAINE VISCOUS HCL 2 % MT SOLN
15.0000 mL | Freq: Once | OROMUCOSAL | Status: AC
  Administered 2018-07-19: 15 mL via ORAL
  Filled 2018-07-19: qty 15

## 2018-07-19 MED ORDER — KETOROLAC TROMETHAMINE 15 MG/ML IJ SOLN
15.0000 mg | Freq: Once | INTRAMUSCULAR | Status: AC
  Administered 2018-07-19: 15 mg via INTRAVENOUS
  Filled 2018-07-19: qty 1

## 2018-07-19 MED ORDER — MORPHINE SULFATE (PF) 4 MG/ML IV SOLN
4.0000 mg | Freq: Once | INTRAVENOUS | Status: AC
  Administered 2018-07-19: 4 mg via INTRAVENOUS
  Filled 2018-07-19: qty 1

## 2018-07-19 MED ORDER — METHOCARBAMOL 500 MG PO TABS: 500.0000 mg | ORAL_TABLET | Freq: Three times a day (TID) | ORAL | 0 refills | Status: AC | PRN

## 2018-07-19 MED ORDER — DIPHENHYDRAMINE HCL 50 MG/ML IJ SOLN: 25.0000 mg | Freq: Once | INTRAMUSCULAR | Status: DC

## 2018-07-19 MED ORDER — ALUM & MAG HYDROXIDE-SIMETH 200-200-20 MG/5ML PO SUSP
30.0000 mL | Freq: Once | ORAL | Status: AC
  Administered 2018-07-19: 30 mL via ORAL
  Filled 2018-07-19: qty 30

## 2018-07-19 MED ORDER — DIPHENHYDRAMINE HCL 50 MG/ML IJ SOLN
50.0000 mg | Freq: Once | INTRAMUSCULAR | Status: AC
  Administered 2018-07-19: 50 mg via INTRAVENOUS
  Filled 2018-07-19: qty 1

## 2018-07-19 NOTE — ED Notes (Signed)
Patient transported to CT 

## 2018-07-19 NOTE — ED Provider Notes (Signed)
[facility] DEPT Provider Note   CSN: [id number] Arrival date & time: 07/19/18  0235     History   Chief Complaint Chief Complaint  Patient presents with  . Back Pain    HPI Alyssa Holmes is a 52 y.o. female.  HPI   53 yo F with PMHx bipolar d/o, MVP, pancreatitis here with multiple complaints.  Primary complaint is right parcervical pain radiating down her arm. Pain has been present intermittently x 1 week. She describes it as a sharp, stabbing, shooting pain in her R shoulder and paraspinal neck area that occasionally shoots down her arm. It is worse in morning, worse with palpation and movement. It occasionally causes tingling in her middle finger and dorsum of hand. No trauma. No LUE sx. No midline neck pain. No alleviating factors.  Pt also reports intermittent dull, pressure like CP with palpitations. This is an ongoing issue but is worse ove rpast several days. Pain seems to come and go randomly and is not associated w/ exertion. No alleviating or aggravating factors. She does have some SOB with it, no diaphoresis. She has occasional palpitations with her sx.  Past Medical History:  Diagnosis Date  . Bipolar disorder ([place])   . Bundle branch block   . Elevated cholesterol    dx in early 35s  . Endometriosis   . Female bladder prolapse   . Fibroid   . Headache(784.0)   . Hypothyroidism   . Mitral valve prolapse    during adolescence  . Ovarian torsion   . Pancreatitis   . Shortness of breath     Patient Active Problem List   Diagnosis Date Noted  . Hypoxia 06/15/2014  . Bundle branch block   . Shortness of breath   . Hypothyroidism   . Bipolar disorder ([place])   . Fibroid   . Endometriosis   . Pancreatitis   . Ovarian torsion   . Female bladder prolapse   . Chest pain, unspecified 09/05/2013  . Elevated cholesterol   . Mitral valve prolapse     Past Surgical History:  Procedure Laterality Date  . ABDOMINAL  HYSTERECTOMY    . APPENDECTOMY    . CHOLECYSTECTOMY    . COLONOSCOPY    . DILATION AND CURETTAGE OF UTERUS    . ESOPHAGOGASTRODUODENOSCOPY (EGD) WITH PROPOFOL N/A 09/09/2013   Procedure: ESOPHAGOGASTRODUODENOSCOPY (EGD) WITH PROPOFOL;  Surgeon: Arta Silence, MD;  Location[facility] END[facility]COPY;  Service: Endoscopy;  Laterality: N/A;  . EUS N/A 09/09/2013   Procedure: ESOPHAGEAL ENDOSCOPIC ULTRASOUND (EUS) RADIAL;  Surgeon: Arta Silence, MD;  Location: [facility] ENDOSCOPY;  Service: Endoscopy;  Laterality: N/A;  . HERNIA REPAIR    . LAPAROSCOPY Left 11/15/2013   Procedure: LAPAROSCOPY OPERATIVE WITH LEFT SALPINGO-OOPHORECTOMY/LYSIS OF ADHESIONS/REMOVAL OF LEFT OVARIAN CYST;  Surgeon: Betsy Coder, MD;  Location: [facility];  Service: Gynecology;  Laterality: Left;  . OOPHORECTOMY       OB History    Gravida  2   Para  2   Term  2   Preterm      AB      Living  2     SAB      TAB      Ectopic      Multiple      Live Births               Home Medications    Prior to Admission medications   Medication Sig Start Date End Date Taking?  Authorizing Provider  acetaminophen (TYLENOL) 500 MG tablet Take 1,000 mg by mouth every 6 (six) hours as needed for mild pain.   Yes [provider]  B Complex-C (B-COMPLEX WITH VITAMIN C) tablet Take 1 tablet by mouth daily.   Yes [provider]  BIOTIN PO Take 1 tablet by mouth daily.   Yes [provider]  Cholecalciferol (VITAMIN D3) 2000 UNITS TABS Take 2 tablets by mouth daily.    Yes [provider]  levothyroxine (SYNTHROID, LEVOTHROID) 100 MCG tablet Take 100 mcg by mouth daily before breakfast.   Yes [provider]  LORazepam (ATIVAN) 0.5 MG tablet Take 0.5 mg by mouth 2 (two) times daily as needed for anxiety or sleep.    Yes [provider]  MAGNESIUM PO Take 1 tablet by mouth daily.   Yes [provider]  NIACIN PO Take 500 mg by mouth daily.    Yes [provider]    polyvinyl alcohol (LIQUIFILM TEARS) 1.4 % ophthalmic solution Place 1 drop into both eyes as needed for dry eyes (stye).   Yes [provider]  venlafaxine XR (EFFEXOR-XR) 75 MG 24 hr capsule Take 75 mg by mouth daily with breakfast.   Yes [provider]  methocarbamol (ROBAXIN) 500 MG tablet Take 1 tablet (500 mg total) by mouth every 8 (eight) hours as needed for up to 5 days for muscle spasms. 07/19/18 07/24/18  Duffy Bruce, MD  nitroGLYCERIN (NITROSTAT) 0.4 MG SL tablet Place 1 tablet (0.4 mg total) under the tongue every 5 (five) minutes x 3 doses as needed for chest pain. Patient not taking: Reported on 07/19/2018 09/06/13   Eileen Stanford, PA-C  pantoprazole (PROTONIX) 40 MG tablet Take 1 tablet (40 mg total) by mouth daily. Patient not taking: Reported on 09/22/6757 16/38/46   Delora Fuel, MD  sucralfate (CARAFATE) 1 g tablet Take 1 tablet (1 g total) by mouth 4 (four) times daily -  with meals and at bedtime for 5 days. 07/19/18 07/24/18  Duffy Bruce, MD    Family History Family History  Problem Relation Age of Onset  . Thyroid disease Mother   . Heart disease Mother   . Diabetes Mother   . Asthma Father   . Stroke Father   . Cancer - Colon Other     Social History Social History   Tobacco Use  . Smoking status: Former Smoker    Types: Cigarettes  . Smokeless tobacco: Never Used  Substance Use Topics  . Alcohol use: Yes    Comment: couple drinks a week  . Drug use: No     Allergies   Vancomycin; Oxycodone-acetaminophen; Toradol [ketorolac tromethamine]; and Penicillins   Review of Systems Review of Systems  Constitutional: Positive for fatigue. Negative for chills and fever.  HENT: Negative for congestion and rhinorrhea.   Eyes: Negative for visual disturbance.  Respiratory: Positive for shortness of breath. Negative for cough and wheezing.   Cardiovascular: Positive for chest pain. Negative for leg swelling.  Gastrointestinal: Negative for  abdominal pain, diarrhea, nausea and vomiting.  Genitourinary: Negative for dysuria and flank pain.  Musculoskeletal: Positive for neck pain. Negative for neck stiffness.  Skin: Negative for rash and wound.  Allergic/Immunologic: Negative for immunocompromised state.  Neurological: Positive for weakness. Negative for syncope and headaches.  All other systems reviewed and are negative.    Physical Exam Updated Vital Signs BP 113/78 (BP Location: Right Arm)   Pulse 85   Temp 98.8 F (  37.1 C) (Oral)   Resp 16   Ht 5\' 5"  (1.651 m)   Wt 68 kg   SpO2 98%   BMI 24.96 kg/m   Physical Exam Vitals signs and nursing note reviewed.  Constitutional:      General: She is not in acute distress.    Appearance: She is well-developed.  HENT:     Head: Normocephalic and atraumatic.  Eyes:     Conjunctiva/sclera: Conjunctivae normal.  Neck:     Musculoskeletal: Neck supple.     Comments: Moderate paraspinal TTP on right with positive Spurling's test. No midline TTP or deformity. Cardiovascular:     Rate and Rhythm: Normal rate and regular rhythm.     Heart sounds: Normal heart sounds. No murmur. No friction rub.  Pulmonary:     Effort: Pulmonary effort is normal. No respiratory distress.     Breath sounds: Normal breath sounds. No wheezing or rales.  Abdominal:     General: There is no distension.     Palpations: Abdomen is soft.     Tenderness: There is no abdominal tenderness.  Skin:    General: Skin is warm.     Capillary Refill: Capillary refill takes less than 2 seconds.  Neurological:     Mental Status: She is alert and oriented to person, place, and time.     Motor: No abnormal muscle tone.     Comments: Strength 5/5 bilateral UE and LE. Normal reflexes. Normal sensation to light touch. CN intact.      ED Treatments / Results  Labs (all labs ordered are listed, but only abnormal results are displayed) Labs Reviewed  CBC WITH DIFFERENTIAL/PLATELET  COMPREHENSIVE  METABOLIC PANEL  LIPASE, BLOOD  TROPONIN I  D-DIMER, QUANTITATIVE (NOT AT [facility]  TROPONIN I  I-STAT BETA HCG BLOOD, ED ([facility], [facility], [facility] ONLY)    EKG None  Radiology Dg Chest 2 View  Result Date: 07/19/2018 CLINICAL DATA:  Chest pain EXAM: CHEST - 2 VIEW COMPARISON:  06/15/2014 FINDINGS: Normal heart size and mediastinal contours. No acute infiltrate or edema. No effusion or pneumothorax. No acute osseous findings. IMPRESSION: Negative chest. Electronically Signed   By: Monte Fantasia M.D.   On: 07/19/2018 06:30   Ct Head Wo Contrast  Result Date: 07/19/2018 CLINICAL DATA:  Mid back pain radiating to the neck with right arm tingling. EXAM: CT HEAD WITHOUT CONTRAST CT CERVICAL SPINE WITHOUT CONTRAST TECHNIQUE: Multidetector CT imaging of the head and cervical spine was performed following the standard protocol without intravenous contrast. Multiplanar CT image reconstructions of the cervical spine were also generated. COMPARISON:  05/31/2016 brain MRI FINDINGS: CT HEAD FINDINGS Brain: No evidence of acute infarction, hemorrhage, hydrocephalus, extra-axial collection or mass lesion/mass effect. Vascular: Mild carotid calcification. Skull: Normal. Negative for fracture or focal lesion. Sinuses/Orbits: Negative CT CERVICAL SPINE FINDINGS Alignment: Normal Skull base and vertebrae: Negative for fracture Soft tissues and spinal canal: Unremarkable. No evident impingement. Disc levels: Unremarkable. No degenerative spurring or visible herniation. Upper chest: Negative IMPRESSION: Negative head and cervical spine CT. Electronically Signed   By: Monte Fantasia M.D.   On: 07/19/2018 05:30   Ct Cervical Spine Wo Contrast  Result Date: 07/19/2018 CLINICAL DATA:  Mid back pain radiating to the neck with right arm tingling. EXAM: CT HEAD WITHOUT CONTRAST CT CERVICAL SPINE WITHOUT CONTRAST TECHNIQUE: Multidetector CT imaging of the head and cervical spine was performed following the standard protocol without  intravenous contrast. Multiplanar CT image reconstructions of the cervical spine  were also generated. COMPARISON:  05/31/2016 brain MRI FINDINGS: CT HEAD FINDINGS Brain: No evidence of acute infarction, hemorrhage, hydrocephalus, extra-axial collection or mass lesion/mass effect. Vascular: Mild carotid calcification. Skull: Normal. Negative for fracture or focal lesion. Sinuses/Orbits: Negative CT CERVICAL SPINE FINDINGS Alignment: Normal Skull base and vertebrae: Negative for fracture Soft tissues and spinal canal: Unremarkable. No evident impingement. Disc levels: Unremarkable. No degenerative spurring or visible herniation. Upper chest: Negative IMPRESSION: Negative head and cervical spine CT. Electronically Signed   By: Monte Fantasia M.D.   On: 07/19/2018 05:30    Procedures Procedures (including critical care time)  Medications Ordered in ED Medications  morphine 4 MG/ML injection 4 mg (4 mg Intravenous Given 07/19/18 0506)  ketorolac (TORADOL) 15 MG/ML injection 15 mg (15 mg Intravenous Given 07/19/18 0505)  alum & mag hydroxide-simeth (MAALOX/MYLANTA) 200-200-20 MG/5ML suspension 30 mL (30 mLs Oral Given 07/19/18 0608)    And  lidocaine (XYLOCAINE) 2 % viscous mouth solution 15 mL (15 mLs Oral Given 07/19/18 0608)  diphenhydrAMINE (BENADRYL) injection 50 mg (50 mg Intravenous Given 07/19/18 0736)     Initial Impression / Assessment and Plan / ED Course  I have reviewed the triage vital signs and the nursing notes.  Pertinent labs & imaging results that were available during my care of the patient were reviewed by me and considered in my medical decision making (see chart for details).    53 yo F with PMHx as above here with multiple complaints.  Neck pain - I suspect this is 2/2 cervical radiculopathy related to her recent initiation of BIPAP and subsequent change in her neck positioning. It is along a radicular distirbution, with positive Spurling and reproducibility w/ movement. No actual  weakness or signs of CVA. No LUE sx or signs of central cord. I do not suspect dissection. Will tx with Robaxin, analgesics, monitoring of head position during sleep.  Chest pain - Atypical, low suspicion for ACS. D-DImer neg, pain is not sharp or tearing, doubt PE or dissection. She does have RBBB on EKG and h/o MVP, raising question of underlying arrhythmia. Will delta trop, if neg d/c with cards f/u.  Of note, pt with mild eye irritation/swelling after receiving Toradol. Now reports ? History of allergic reaction to motrin. Will give benadryl. No second system involvement or signs of anaphylaxis. Added to allergy list.  Final Clinical Impressions(s) / ED Diagnoses   Final diagnoses:  Atypical chest pain  Neck pain on right side    ED Discharge Orders         Ordered    methocarbamol (ROBAXIN) 500 MG tablet  Every 8 hours PRN     07/19/18 0813    sucralfate (CARAFATE) 1 g tablet  3 times daily with meals & bedtime     07/19/18 0813           Duffy Bruce, MD 07/19/18 [phone]

## 2018-07-19 NOTE — ED Notes (Signed)
Pt called out stating she has chest pain, patient showing upper epigastric pain. Dr. Ellender Hose made aware.

## 2018-07-19 NOTE — ED Triage Notes (Signed)
Pt reports mid back pain that radiates up to the back of her neck. Non traumatic. She states that it makes her R arm tingle. Equal strength in both arms. CMS intact. A&Ox4. Ambulatory.

## 2018-07-19 NOTE — ED Provider Notes (Signed)
9:42 AM Patient in no distress, calm. She is aware of remaining labs including reassuring second troponin value which was normal.  Patient will follow-up with primary care, was discharged in stable condition.    Carmin Muskrat, MD 07/19/18 [phone]

## 2018-07-29 ENCOUNTER — Ambulatory Visit: Payer: Federal, State, Local not specified - PPO | Admitting: Audiology

## 2018-08-01 DIAGNOSIS — G4733 Obstructive sleep apnea (adult) (pediatric): Secondary | ICD-10-CM | POA: Diagnosis not present

## 2018-08-30 DIAGNOSIS — G4733 Obstructive sleep apnea (adult) (pediatric): Secondary | ICD-10-CM | POA: Diagnosis not present

## 2018-09-23 DIAGNOSIS — I4510 Unspecified right bundle-branch block: Secondary | ICD-10-CM | POA: Diagnosis not present

## 2018-09-23 DIAGNOSIS — G4733 Obstructive sleep apnea (adult) (pediatric): Secondary | ICD-10-CM | POA: Diagnosis not present

## 2018-09-23 DIAGNOSIS — R002 Palpitations: Secondary | ICD-10-CM | POA: Diagnosis not present

## 2018-09-27 ENCOUNTER — Emergency Department (HOSPITAL_COMMUNITY)
Admission: EM | Admit: 2018-09-27 | Discharge: 2018-09-27 | Disposition: A | Discharge status: Home / Self Care | Payer: Federal, State, Local not specified - PPO | Attending: Emergency Medicine | Admitting: Emergency Medicine

## 2018-09-27 ENCOUNTER — Other Ambulatory Visit: Payer: Self-pay

## 2018-09-27 ENCOUNTER — Emergency Department (HOSPITAL_COMMUNITY): Payer: Federal, State, Local not specified - PPO

## 2018-09-27 DIAGNOSIS — R51 Headache: Secondary | ICD-10-CM | POA: Insufficient documentation

## 2018-09-27 DIAGNOSIS — R0789 Other chest pain: Secondary | ICD-10-CM | POA: Insufficient documentation

## 2018-09-27 DIAGNOSIS — E039 Hypothyroidism, unspecified: Secondary | ICD-10-CM | POA: Insufficient documentation

## 2018-09-27 DIAGNOSIS — Z79899 Other long term (current) drug therapy: Secondary | ICD-10-CM | POA: Insufficient documentation

## 2018-09-27 DIAGNOSIS — R110 Nausea: Secondary | ICD-10-CM | POA: Insufficient documentation

## 2018-09-27 DIAGNOSIS — R0602 Shortness of breath: Secondary | ICD-10-CM | POA: Insufficient documentation

## 2018-09-27 DIAGNOSIS — Z87891 Personal history of nicotine dependence: Secondary | ICD-10-CM | POA: Insufficient documentation

## 2018-09-27 DIAGNOSIS — R002 Palpitations: Secondary | ICD-10-CM | POA: Diagnosis not present

## 2018-09-27 DIAGNOSIS — R079 Chest pain, unspecified: Secondary | ICD-10-CM | POA: Diagnosis not present

## 2018-09-27 LAB — CBC WITH DIFFERENTIAL/PLATELET
Abs Immature Granulocytes: 0.03 10*3/uL (ref 0.00–0.07)
Basophils Absolute: 0 10*3/uL (ref 0.0–0.1)
Basophils Relative: 0 %
Eosinophils Absolute: 0.2 10*3/uL (ref 0.0–0.5)
Eosinophils Relative: 3 %
HCT: 43.6 % (ref 36.0–46.0)
Hemoglobin: 14.3 g/dL (ref 12.0–15.0)
Immature Granulocytes: 0 %
Lymphocytes Relative: 32 %
Lymphs Abs: 2.6 10*3/uL (ref 0.7–4.0)
MCH: 28.8 pg (ref 26.0–34.0)
MCHC: 32.8 g/dL (ref 30.0–36.0)
MCV: 87.7 fL (ref 80.0–100.0)
Monocytes Absolute: 0.6 10*3/uL (ref 0.1–1.0)
Monocytes Relative: 8 %
Neutro Abs: 4.5 10*3/uL (ref 1.7–7.7)
Neutrophils Relative %: 57 %
Platelets: 258 10*3/uL (ref 150–400)
RBC: 4.97 MIL/uL (ref 3.87–5.11)
RDW: 11.8 % (ref 11.5–15.5)
WBC: 8 10*3/uL (ref 4.0–10.5)
nRBC: 0 % (ref 0.0–0.2)

## 2018-09-27 LAB — BASIC METABOLIC PANEL
Anion gap: 12 (ref 5–15)
BUN: 11 mg/dL (ref 6–20)
CO2: 27 mmol/L (ref 22–32)
Calcium: 10.1 mg/dL (ref 8.9–10.3)
Chloride: 101 mmol/L (ref 98–111)
Creatinine, Ser: 0.81 mg/dL (ref 0.44–1.00)
GFR calc Af Amer: >60 mL/min (ref >60)
GFR calc non Af Amer: >60 mL/min (ref >60)
Glucose, Bld: 110 mg/dL — ABNORMAL HIGH (ref 70–99)
Potassium: 3.8 mmol/L (ref 3.5–5.1)
Sodium: 140 mmol/L (ref 135–145)

## 2018-09-27 LAB — I-STAT TROPONIN, ED
Troponin i, poc: 0.01 ng/mL (ref 0.00–0.08)
Troponin i, poc: 0 ng/mL (ref 0.00–0.08)

## 2018-09-27 LAB — D-DIMER, QUANTITATIVE: D-Dimer, Quant: <0.27 ug/mL-FEU (ref 0.00–0.50)

## 2018-09-27 LAB — TSH: TSH: 0.683 u[IU]/mL (ref 0.350–4.500)

## 2018-09-27 MED ORDER — MECLIZINE HCL 25 MG PO TABS
25.0000 mg | ORAL_TABLET | Freq: Once | ORAL | Status: AC
  Administered 2018-09-27: 25 mg via ORAL
  Filled 2018-09-27: qty 1

## 2018-09-27 NOTE — ED Provider Notes (Addendum)
[facility] EMERGENCY DEPARTMENT Provider Note   CSN: [id number] Arrival date & time: 09/27/18  1345    History   Chief Complaint Chief Complaint  Patient presents with  . Palpitations    HPI Alyssa Holmes is a 53 y.o. female with medical history significant for hypothyroidism, RBBB,  MVP during adolescence, hyperlipidemia presenting to emergency department today with chief complaint of palpitations, chest pain and dizziness x 2 weeks. Pt reports the dizziness episodes typically last 15 minutes and afterward she has a headache, palpitations, nausea. She reports associated nasal congestion. She has not tried any medications for her symptoms prior to arrival.   The palpitations as feeling like her heart is going to beat out of her chest. Also has associated shortness of breath with palpitations. She also noticed one day recently she had left calf tenderness and denies lower extremity edema.  She describes the chest pain as a pressure under her left breast that does not radiate. She reports the chest pressure does not have an association with activity. She has not seen a cardiologist in 5 years.  Denies associated diaphoresis, fever, numbness or weakness, syncope, jaw pain, visual changes, cough, abdominal pain, diarrhea, flushing.   Past Medical History:  Diagnosis Date  . Bipolar disorder ([place])   . Bundle branch block   . Elevated cholesterol    dx in early 56s  . Endometriosis   . Female bladder prolapse   . Fibroid   . Headache(784.0)   . Hypothyroidism   . Mitral valve prolapse    during adolescence  . Ovarian torsion   . Pancreatitis   . Shortness of breath     Patient Active Problem List   Diagnosis Date Noted  . Hypoxia 06/15/2014  . Bundle branch block   . Shortness of breath   . Hypothyroidism   . Bipolar disorder ([place])   . Fibroid   . Endometriosis   . Pancreatitis   . Ovarian torsion   . Female bladder prolapse   . Chest pain,  unspecified 09/05/2013  . Elevated cholesterol   . Mitral valve prolapse     Past Surgical History:  Procedure Laterality Date  . ABDOMINAL HYSTERECTOMY    . APPENDECTOMY    . CHOLECYSTECTOMY    . COLONOSCOPY    . DILATION AND CURETTAGE OF UTERUS    . ESOPHAGOGASTRODUODENOSCOPY (EGD) WITH PROPOFOL N/A 09/09/2013   Procedure: ESOPHAGOGASTRODUODENOSCOPY (EGD) WITH PROPOFOL;  Surgeon: Arta Silence, MD;  Location: [facility] ENDOSCOPY;  Service: Endoscopy;  Laterality: N/A;  . EUS N/A 09/09/2013   Procedure: ESOPHAGEAL ENDOSCOPIC ULTRASOUND (EUS) RADIAL;  Surgeon: Arta Silence, MD;  Location: [facility] ENDOSCOPY;  Service: Endoscopy;  Laterality: N/A;  . HERNIA REPAIR    . LAPAROSCOPY Left 11/15/2013   Procedure: LAPAROSCOPY OPERATIVE WITH LEFT SALPINGO-OOPHORECTOMY/LYSIS OF ADHESIONS/REMOVAL OF LEFT OVARIAN CYST;  Surgeon: Betsy Coder, MD;  Location: [facility];  Service: Gynecology;  Laterality: Left;  . OOPHORECTOMY       OB History    Gravida  2   Para  2   Term  2   Preterm      AB      Living  2     SAB      TAB      Ectopic      Multiple      Live Births               Home Medications    Prior to Admission  medications   Medication Sig Start Date End Date Taking? Authorizing Provider  acetaminophen (TYLENOL) 500 MG tablet Take 1,000 mg by mouth every 6 (six) hours as needed for mild pain.    [provider]  B Complex-C (B-COMPLEX WITH VITAMIN C) tablet Take 1 tablet by mouth daily.    [provider]  BIOTIN PO Take 1 tablet by mouth daily.    [provider]  Cholecalciferol (VITAMIN D3) 2000 UNITS TABS Take 2 tablets by mouth daily.     [provider]  levothyroxine (SYNTHROID, LEVOTHROID) 100 MCG tablet Take 100 mcg by mouth daily before breakfast.    [provider]  LORazepam (ATIVAN) 0.5 MG tablet Take 0.5 mg by mouth 2 (two) times daily as needed for anxiety or sleep.     [provider]  MAGNESIUM PO Take  1 tablet by mouth daily.    [provider]  NIACIN PO Take 500 mg by mouth daily.     [provider]  nitroGLYCERIN (NITROSTAT) 0.4 MG SL tablet Place 1 tablet (0.4 mg total) under the tongue every 5 (five) minutes x 3 doses as needed for chest pain. Patient not taking: Reported on 07/19/2018 09/06/13   Eileen Stanford, PA-C  pantoprazole (PROTONIX) 40 MG tablet Take 1 tablet (40 mg total) by mouth daily. Patient not taking: Reported on 06/23/4079 44/81/85   Delora Fuel, MD  polyvinyl alcohol (LIQUIFILM TEARS) 1.4 % ophthalmic solution Place 1 drop into both eyes as needed for dry eyes (stye).    [provider]  sucralfate (CARAFATE) 1 g tablet Take 1 tablet (1 g total) by mouth 4 (four) times daily -  with meals and at bedtime for 5 days. 07/19/18 07/24/18  Duffy Bruce, MD  venlafaxine XR (EFFEXOR-XR) 75 MG 24 hr capsule Take 75 mg by mouth daily with breakfast.    [provider]    Family History Family History  Problem Relation Age of Onset  . Thyroid disease Mother   . Heart disease Mother   . Diabetes Mother   . Asthma Father   . Stroke Father   . Cancer - Colon Other     Social History Social History   Tobacco Use  . Smoking status: Former Smoker    Types: Cigarettes  . Smokeless tobacco: Never Used  Substance Use Topics  . Alcohol use: Yes    Comment: couple drinks a week  . Drug use: No     Allergies   Vancomycin; Ibuprofen; Oxycodone-acetaminophen; Toradol [ketorolac tromethamine]; and Penicillins   Review of Systems Review of Systems  Constitutional: Negative for chills and fever.  HENT: Positive for congestion. Negative for ear discharge, ear pain, sinus pressure, sinus pain and sore throat.   Eyes: Negative for pain, redness and visual disturbance.  Respiratory: Negative for cough and shortness of breath.   Cardiovascular: Positive for chest pain and palpitations.  Gastrointestinal: Positive for nausea. Negative for  abdominal pain, constipation, diarrhea and vomiting.  Genitourinary: Negative for dysuria and hematuria.  Musculoskeletal: Negative for back pain and neck pain.  Skin: Negative for wound.  Neurological: Positive for dizziness and headaches. Negative for weakness and numbness.     Physical Exam Updated Vital Signs BP 127/84   Pulse 84   Temp 98.3 F (36.8 C) (Oral)   Resp 19   Ht 5\' 5"  (1.651 m)   Wt 68.5 kg   SpO2 97%   BMI 25.13 kg/m   Physical Exam Vitals signs  and nursing note reviewed.  Constitutional:      General: She is not in acute distress.    Appearance: She is well-developed. She is not ill-appearing or toxic-appearing.  HENT:     Head: Normocephalic and atraumatic.     Comments: No sinus or temporal tenderness.    Right Ear: Tympanic membrane normal. There is no impacted cerumen.     Left Ear: Tympanic membrane normal. There is no impacted cerumen.     Nose: Congestion present.     Mouth/Throat:     Mouth: Mucous membranes are moist.     Pharynx: Oropharynx is clear.  Eyes:     General: No scleral icterus.       Right eye: No discharge.        Left eye: No discharge.     Extraocular Movements: Extraocular movements intact.     Right eye: No nystagmus.     Left eye: No nystagmus.     Conjunctiva/sclera: Conjunctivae normal.  Neck:     Musculoskeletal: Normal range of motion.  Cardiovascular:     Rate and Rhythm: Normal rate and regular rhythm.     Pulses: Normal pulses.          Radial pulses are 2+ on the right side and 2+ on the left side.     Heart sounds: Normal heart sounds.  Pulmonary:     Effort: Pulmonary effort is normal.     Breath sounds: Normal breath sounds. No wheezing or rales.     Comments: Pt is speakign in full sentences. During my exam SpO2 is 97% on room air. Lungs are clear to auscultation in all fields. Chest:     Chest wall: No tenderness.  Abdominal:     General: There is no distension.     Palpations: Abdomen is soft.      Tenderness: There is no abdominal tenderness. There is no guarding or rebound.  Musculoskeletal: Normal range of motion.     Comments: Negative homans sign bilaterally. No lower extremity edema. Bilateral calves non tender to palpation.  Skin:    General: Skin is warm and dry.     Findings: No erythema or rash.  Neurological:     Mental Status: She is oriented to person, place, and time.     Comments: Mental Status:  Alert, oriented, thought content appropriate, able to give a coherent history. Speech fluent without evidence of aphasia. Able to follow 2 step commands without difficulty.  Cranial Nerves:  II:  Peripheral visual fields grossly normal, pupils equal, round, reactive to light III,IV, VI: ptosis not present, extra-ocular motions intact bilaterally  V,VII: smile symmetric, facial light touch sensation equal VIII: hearing grossly normal to voice  X: uvula elevates symmetrically  XI: bilateral shoulder shrug symmetric and strong XII: midline tongue extension without fassiculations Motor:  Normal tone. 5/5 in upper and lower extremities bilaterally including strong and equal grip strength and dorsiflexion/plantar flexion Sensory: Pinprick and light touch normal in all extremities.  Deep Tendon Reflexes: 2+ and symmetric in the biceps and patella Cerebellar: normal finger-to-nose with bilateral upper extremities Gait: normal gait and balance CV: distal pulses palpable throughout   Psychiatric:        Behavior: Behavior normal.      ED Treatments / Results  Labs (all labs ordered are listed, but only abnormal results are displayed) Labs Reviewed  BASIC METABOLIC PANEL - Abnormal; Notable for the following components:      Result Value  Glucose, Bld 110 (*)    All other components within normal limits  CBC WITH DIFFERENTIAL/PLATELET  D-DIMER, QUANTITATIVE (NOT AT [facility])  TSH  TROPONIN I  I-STAT TROPONIN, ED    EKG None  Radiology Dg Chest Portable 1 View   Result Date: 09/27/2018 CLINICAL DATA:  Chest pain and shortness-of-breath with nausea 1 day. EXAM: PORTABLE CHEST 1 VIEW COMPARISON:  07/19/2018 FINDINGS: Lungs are adequately inflated and otherwise clear. Cardiomediastinal silhouette and remainder of the exam is unremarkable. IMPRESSION: No active disease. Electronically Signed   By: Marin Olp M.D.   On: 09/27/2018 15:05    Procedures Procedures (including critical care time)  Medications Ordered in ED Medications  meclizine (ANTIVERT) tablet 25 mg (25 mg Oral Given 09/27/18 1609)     Initial Impression / Assessment and Plan / ED Course  I have reviewed the triage vital signs and the nursing notes.  Pertinent labs & imaging results that were available during my care of the patient were reviewed by me and considered in my medical decision making (see chart for details).  Pt is a 53 yo female presenting with 2 weeks of intermittent dizziness, palpitations, and chest pressure. She is afebrile, well appearing and in no acute distress. Will initiate work up with EKG, chest xray, BMP, CBC, d dimer, troponin. DDX includes anxiety, PE, ACS, metabolic derangement, anemia.    Pt takes synthroid and does not remember the last time she had her thyroid checked. TSH added to labs. Pt's neuro exam is without focal deficit. CBC, BMP, troponin all unremarkable including No metabolic degranagements, no renal insufficieny, no anemia. D-dimer is negative. EKG viewed by me is unchanged from prior and is without ischemic changes. She has known RBBB that appears unchanged. Chest xray also viewed by me is clear, agree with radiologist reading. Will order Meclizine for her dizziness and nausea. Pt case discussed with Dr. Lita Mains who agrees with my plan.   At shift change care was transferred to [facility] who will follow pending studies, re-evaulate and determine disposition.  Second troponin to be collected in 3 hours. If negative and symptoms are  improved will discharge home with close pcp follow up.     Final Clinical Impressions(s) / ED Diagnoses   Final diagnoses:  None    ED Discharge Orders    None       Cherre Robins, PA-C 09/27/18 1617    Yamil Dougher, Harley Hallmark, PA-C 09/27/18 1618    Julianne Rice, MD 09/28/18 [phone]

## 2018-09-27 NOTE — Discharge Instructions (Signed)
Please read and follow all provided instructions.  Your diagnoses today include:  1. Palpitations     Tests performed today include:  Vital signs. See below for your results today.  EKG - shows right bundle branch block but unchanged  Screening test for blood clot -was negative  TSH-test for thyroid was normal  Blood counts and electrolytes  Cardiac enzymes or signs of heart muscle damage or irritation were normal  Medications prescribed:   None  Home care instructions:  Follow any educational materials contained in this packet.  Follow-up instructions: Please follow-up with your primary care provider and cardiologist for further evaluation of your symptoms.  Return instructions:   Please return to the Emergency Department if you experience worsening symptoms.   Please return if you have any other emergent concerns.  Additional Information:  Your vital signs today were: BP 123/80 (BP Location: Right Arm)    Pulse 85    Temp 98.3 F (36.8 C) (Oral)    Resp 17    Ht 5\' 5"  (1.651 m)    Wt 68.5 kg    SpO2 98%    BMI 25.13 kg/m  If your blood pressure (BP) was elevated above 135/85 this visit, please have this repeated by your doctor within one month. ---------------

## 2018-09-27 NOTE — ED Triage Notes (Signed)
Pt here for palpitations and shortness of breath since Thursday. Seen at PCP who suggested she come here.

## 2018-09-27 NOTE — ED Notes (Signed)
Patient verbalizes understanding of discharge instructions. Opportunity for questioning and answers were provided. Armband removed by staff, pt discharged from ED.  

## 2018-09-27 NOTE — ED Provider Notes (Signed)
6:16 PM handoff from [place], PA-C at shift change.   Patient pending second troponin and thyroid stimulating hormone test.  Repeat EKG was unchanged.  Troponin 0.01>0.00.  Patient without significant symptoms.  No new patients for admission at this time.  Patient will be discharged home.  She will follow-up with her PCP and cardiologist regarding her dizziness and palpitations.  We discussed signs and symptoms to return including worsening shortness of breath, syncope, new symptoms or other concerns.  Patient verbalizes understanding agrees with plan.   BP 123/80 (BP Location: Right Arm)   Pulse 85   Temp 98.3 F (36.8 C) (Oral)   Resp 17   Ht 5\' 5"  (1.651 m)   Wt 68.5 kg   SpO2 98%   BMI 25.13 kg/m     Carlisle Cater, PA-C 09/27/18 1817    Julianne Rice, MD 09/28/18 [phone]

## 2018-10-05 ENCOUNTER — Telehealth: Payer: Self-pay | Admitting: Cardiovascular Disease

## 2018-10-05 NOTE — Telephone Encounter (Signed)
Spoke with patient who confirmed all demographics. She is active on My Chart and has smart phone. Will have vitals ready for visit.

## 2018-10-11 ENCOUNTER — Other Ambulatory Visit: Payer: Self-pay

## 2018-10-11 ENCOUNTER — Encounter: Payer: Self-pay | Admitting: Cardiovascular Disease

## 2018-10-11 ENCOUNTER — Telehealth (INDEPENDENT_AMBULATORY_CARE_PROVIDER_SITE_OTHER): Payer: Federal, State, Local not specified - PPO | Admitting: Cardiovascular Disease

## 2018-10-11 VITALS — Ht 65.0 in | Wt 150.0 lb

## 2018-10-11 DIAGNOSIS — Z7189 Other specified counseling: Secondary | ICD-10-CM

## 2018-10-11 DIAGNOSIS — I341 Nonrheumatic mitral (valve) prolapse: Secondary | ICD-10-CM

## 2018-10-11 DIAGNOSIS — I454 Nonspecific intraventricular block: Secondary | ICD-10-CM | POA: Diagnosis not present

## 2018-10-11 DIAGNOSIS — R002 Palpitations: Secondary | ICD-10-CM | POA: Insufficient documentation

## 2018-10-11 DIAGNOSIS — R079 Chest pain, unspecified: Secondary | ICD-10-CM

## 2018-10-11 NOTE — Patient Instructions (Addendum)
Medication Instructions:  Your physician recommends that you continue on your current medications as directed. Please refer to the Current Medication list given to you today. Increase your intake of fluids (water with electrolyte tabs like Nun tablets, or gatorade) , protein ( hard boiled eggs, chicken, fish) , and a electrolytes ( V-8 juice, salt, potassium chloride  which is sold as No-Salt  If you need a refill on your cardiac medications before your next appointment, please call your pharmacy.    Lab work: None Ordered    Testing/Procedures: None Ordered    Follow-Up: Your physician recommends that you return for a follow-up appointment on Wednesday August 19 at 4:00 pm

## 2018-10-11 NOTE — Progress Notes (Signed)
Virtual Visit via Video Note   This visit type was conducted due to national recommendations for restrictions regarding the COVID-19 Pandemic (e.g. social distancing) in an effort to limit this patient's exposure and mitigate transmission in our community.  Due to her co-morbid illnesses, this patient is at least at moderate risk for complications without adequate follow up.  This format is felt to be most appropriate for this patient at this time.  All issues noted in this document were discussed and addressed.  A limited physical exam was performed with this format.  Please refer to the patient's chart for her consent to telehealth for [facility].   Evaluation Performed:  Follow-up visit  Date:  10/11/2018   ID:  Alyssa Holmes, Alyssa Holmes [birth year], MRN [id number]  Patient Location: Home Provider Location: Office  PCP:  Patient, No Pcp Per  Cardiologist:  No primary care provider on file.  Electrophysiologist:  None   Chief Complaint:  Palpitations   History of Present Illness:    Alyssa Holmes is a 53 y.o. female with palpitations .   Having palpitations and chest tightness Lots of dizzyness.  Went to the ER  ECG was normal  Had some thyroid issues   For the past month Has a hx of MVP  Walks several times a week. Palps are not related to exercise  palps are not related to caffiene intake .  Seems to occur more in the morning  Or at night.   The patient does not have symptoms concerning for COVID-19 infection (fever, chills, cough, or new shortness of breath).    Past Medical History:  Diagnosis Date  . Bipolar disorder ([place])   . Bundle branch block   . Elevated cholesterol    dx in early 69s  . Endometriosis   . Female bladder prolapse   . Fibroid   . Headache(784.0)   . Hypothyroidism   . Mitral valve prolapse    during adolescence  . Ovarian torsion   . Pancreatitis   . Shortness of breath    Past Surgical History:  Procedure Laterality  Date  . ABDOMINAL HYSTERECTOMY    . APPENDECTOMY    . CHOLECYSTECTOMY    . COLONOSCOPY    . DILATION AND CURETTAGE OF UTERUS    . ESOPHAGOGASTRODUODENOSCOPY (EGD) WITH PROPOFOL N/A 09/09/2013   Procedure: ESOPHAGOGASTRODUODENOSCOPY (EGD) WITH PROPOFOL;  Surgeon: Arta Silence, MD;  Location: [facility] ENDOSCOPY;  Service: Endoscopy;  Laterality: N/A;  . EUS N/A 09/09/2013   Procedure: ESOPHAGEAL ENDOSCOPIC ULTRASOUND (EUS) RADIAL;  Surgeon: Arta Silence, MD;  Location: [facility] ENDOSCOPY;  Service: Endoscopy;  Laterality: N/A;  . HERNIA REPAIR    . LAPAROSCOPY Left 11/15/2013   Procedure: LAPAROSCOPY OPERATIVE WITH LEFT SALPINGO-OOPHORECTOMY/LYSIS OF ADHESIONS/REMOVAL OF LEFT OVARIAN CYST;  Surgeon: Betsy Coder, MD;  Location: [facility];  Service: Gynecology;  Laterality: Left;  . OOPHORECTOMY       Current Meds  Medication Sig  . acetaminophen (TYLENOL) 500 MG tablet Take 1,000 mg by mouth every 6 (six) hours as needed for mild pain.  . B Complex-C (B-COMPLEX WITH VITAMIN C) tablet Take 1 tablet by mouth daily.  Marland Kitchen BIOTIN PO Take 1 tablet by mouth daily.  . Cholecalciferol (VITAMIN D3) 2000 UNITS TABS Take 2 tablets by mouth daily.   Marland Kitchen lamoTRIgine (LAMICTAL) 200 MG tablet Take 1 tablet by mouth every evening.  Marland Kitchen levothyroxine (SYNTHROID) 75 MCG tablet Take 1 tablet by mouth daily.  Marland Kitchen LORazepam (ATIVAN) 0.5 MG  tablet Take 0.5 mg by mouth daily as needed for anxiety or sleep.   Marland Kitchen MAGNESIUM PO Take 1 tablet by mouth daily.  Marland Kitchen NIACIN PO Take 500 mg by mouth daily.   Marland Kitchen venlafaxine XR (EFFEXOR-XR) 75 MG 24 hr capsule Take 75 mg by mouth daily with breakfast.     Allergies:   Vancomycin; Ibuprofen; Oxycodone-acetaminophen; Toradol [ketorolac tromethamine]; and Penicillins   Social History   Tobacco Use  . Smoking status: Former Smoker    Types: Cigarettes  . Smokeless tobacco: Never Used  Substance Use Topics  . Alcohol use: Yes    Comment: couple drinks a week  . Drug use: No     Family Hx:  The patient's family history includes Asthma in her father; Cancer - Colon in an other family member; Diabetes in her mother; Heart disease in her mother; Stroke in her father; Thyroid disease in her mother.  ROS:   Please see the history of present illness.     All other systems reviewed and are negative.   Prior CV studies:   The following studies were reviewed today:    Labs/Other Tests and Data Reviewed:    EKG:  An ECG dated 09/28/18 was personally reviewed today and demonstrated:   NSR at 85.  RBBB .   Recent Labs: 07/19/2018: ALT 32 09/27/2018: BUN 11; Creatinine, Ser 0.81; Hemoglobin 14.3; Platelets 258; Potassium 3.8; Sodium 140; TSH 0.683   Recent Lipid Panel Lab Results  Component Value Date/Time   CHOL 218 (H) 09/06/2013 01:18 PM   TRIG 403 (H) 09/09/2013 01:12 PM   HDL 30 (L) 09/06/2013 01:18 PM   CHOLHDL 7.3 09/06/2013 01:18 PM   LDLCALC UNABLE TO CALCULATE IF TRIGLYCERIDE OVER 400 mg/dL 09/06/2013 01:18 PM    Wt Readings from Last 3 Encounters:  10/11/18 150 lb (68 kg)  09/27/18 151 lb (68.5 kg)  07/19/18 150 lb (68 kg)     Objective:    Vital Signs:  Ht 5\' 5"  (1.651 m)   Wt 150 lb (68 kg)   BMI 24.96 kg/m    VITAL SIGNS:  reviewed GEN:  no acute distress EYES:  sclerae anicteric, EOMI - Extraocular Movements Intact RESPIRATORY:  normal respiratory effort, symmetric expansion CARDIOVASCULAR:  no peripheral edema SKIN:  no rash, lesions or ulcers. MUSCULOSKELETAL:  no obvious deformities. NEURO:  alert and oriented x 3, no obvious focal deficit PSYCH:  normal affect  ASSESSMENT & PLAN:    1. Palpitations: Alyssa Holmes is seen today by a virtual office visit for further evaluation of her palpitations.  Clinically, these sound like premature ventricular contractions.  They tend to occur more in the morning or later.  They do not occur when she is up and around.  In the emergency room her the lower end of normal.  I have advised her to start drinking a can of  V8 juice every day.  I have advised her to start a regular exercise program.  At this point I do not think that we need to monitor I will have a low threshold to set out a monitor if her palpitations do not improve. 2.  3. 2.  Mitral valve prolapse: She has a history of mild mitral valve prolapse.  We will do some echocardiogram in several months.  I will see her in the office for follow-up visit in 3 to 4 months.  COVID-19 Education: The signs and symptoms of COVID-19 were discussed with the patient and how to seek care  for testing (follow up with PCP or arrange E-visit).  The importance of social distancing was discussed today.  Time:   Today, I have spent 32  minutes with the patient with telehealth technology discussing the above problems.     Medication Adjustments/Labs and Tests Ordered: Current medicines are reviewed at length with the patient today.  Concerns regarding medicines are outlined above.   Tests Ordered: No orders of the defined types were placed in this encounter.   Medication Changes: No orders of the defined types were placed in this encounter.   Disposition:  Follow up in 3 month(s)  Signed, Mertie Moores, MD  10/11/2018 2:16 PM    [facility]

## 2018-10-12 ENCOUNTER — Encounter: Payer: Federal, State, Local not specified - PPO | Admitting: Audiology

## 2018-10-21 DIAGNOSIS — F419 Anxiety disorder, unspecified: Secondary | ICD-10-CM | POA: Diagnosis not present

## 2018-11-09 DIAGNOSIS — M545 Low back pain: Secondary | ICD-10-CM | POA: Diagnosis not present

## 2018-11-12 DIAGNOSIS — E039 Hypothyroidism, unspecified: Secondary | ICD-10-CM | POA: Diagnosis not present

## 2018-11-12 DIAGNOSIS — M545 Low back pain: Secondary | ICD-10-CM | POA: Diagnosis not present

## 2018-11-19 DIAGNOSIS — M545 Low back pain: Secondary | ICD-10-CM | POA: Diagnosis not present

## 2018-11-26 DIAGNOSIS — M545 Low back pain: Secondary | ICD-10-CM | POA: Diagnosis not present

## 2018-12-06 DIAGNOSIS — R1032 Left lower quadrant pain: Secondary | ICD-10-CM | POA: Diagnosis not present

## 2018-12-08 DIAGNOSIS — R1032 Left lower quadrant pain: Secondary | ICD-10-CM | POA: Diagnosis not present

## 2018-12-10 ENCOUNTER — Other Ambulatory Visit: Payer: Self-pay | Admitting: Physician Assistant

## 2018-12-10 DIAGNOSIS — R1032 Left lower quadrant pain: Secondary | ICD-10-CM

## 2018-12-15 DIAGNOSIS — K080 Exfoliation of teeth due to systemic causes: Secondary | ICD-10-CM | POA: Diagnosis not present

## 2018-12-23 ENCOUNTER — Other Ambulatory Visit: Payer: Federal, State, Local not specified - PPO

## 2018-12-28 DIAGNOSIS — Z03818 Encounter for observation for suspected exposure to other biological agents ruled out: Secondary | ICD-10-CM | POA: Diagnosis not present

## 2018-12-30 ENCOUNTER — Other Ambulatory Visit: Payer: Self-pay

## 2018-12-30 ENCOUNTER — Ambulatory Visit
Admission: RE | Admit: 2018-12-30 | Discharge: 2018-12-30 | Disposition: A | Discharge status: Home / Self Care | Payer: Federal, State, Local not specified - PPO | Source: Ambulatory Visit | Attending: Physician Assistant | Admitting: Physician Assistant

## 2018-12-30 DIAGNOSIS — R1032 Left lower quadrant pain: Secondary | ICD-10-CM

## 2018-12-30 DIAGNOSIS — G4733 Obstructive sleep apnea (adult) (pediatric): Secondary | ICD-10-CM | POA: Diagnosis not present

## 2018-12-30 DIAGNOSIS — K760 Fatty (change of) liver, not elsewhere classified: Secondary | ICD-10-CM | POA: Diagnosis not present

## 2018-12-30 DIAGNOSIS — R1030 Lower abdominal pain, unspecified: Secondary | ICD-10-CM | POA: Diagnosis not present

## 2018-12-30 MED ORDER — IOPAMIDOL (ISOVUE-300) INJECTION 61%
125.0000 mL | Freq: Once | INTRAVENOUS | Status: AC | PRN
  Administered 2018-12-30: 125 mL via INTRAVENOUS

## 2019-01-03 DIAGNOSIS — Z87440 Personal history of urinary (tract) infections: Secondary | ICD-10-CM | POA: Diagnosis not present

## 2019-01-04 DIAGNOSIS — U071 COVID-19: Secondary | ICD-10-CM | POA: Diagnosis not present

## 2019-01-05 DIAGNOSIS — R109 Unspecified abdominal pain: Secondary | ICD-10-CM | POA: Diagnosis not present

## 2019-01-05 DIAGNOSIS — K5900 Constipation, unspecified: Secondary | ICD-10-CM | POA: Diagnosis not present

## 2019-01-05 DIAGNOSIS — K760 Fatty (change of) liver, not elsewhere classified: Secondary | ICD-10-CM | POA: Diagnosis not present

## 2019-01-17 DIAGNOSIS — F419 Anxiety disorder, unspecified: Secondary | ICD-10-CM | POA: Diagnosis not present

## 2019-01-17 DIAGNOSIS — F319 Bipolar disorder, unspecified: Secondary | ICD-10-CM | POA: Diagnosis not present

## 2019-01-30 DIAGNOSIS — G4733 Obstructive sleep apnea (adult) (pediatric): Secondary | ICD-10-CM | POA: Diagnosis not present

## 2019-02-02 ENCOUNTER — Other Ambulatory Visit: Payer: Self-pay

## 2019-02-02 ENCOUNTER — Ambulatory Visit (INDEPENDENT_AMBULATORY_CARE_PROVIDER_SITE_OTHER): Payer: Federal, State, Local not specified - PPO | Admitting: Cardiovascular Disease

## 2019-02-02 ENCOUNTER — Encounter: Payer: Self-pay | Admitting: Cardiovascular Disease

## 2019-02-02 VITALS — BP 110/68 | HR 106 | Ht 65.0 in | Wt 155.8 lb

## 2019-02-02 DIAGNOSIS — R000 Tachycardia, unspecified: Secondary | ICD-10-CM | POA: Diagnosis not present

## 2019-02-02 DIAGNOSIS — R002 Palpitations: Secondary | ICD-10-CM

## 2019-02-02 MED ORDER — METOPROLOL TARTRATE 25 MG PO TABS: 25.0000 mg | ORAL_TABLET | Freq: Two times a day (BID) | ORAL | 3 refills | Status: DC

## 2019-02-02 NOTE — Patient Instructions (Addendum)
Medication Instructions:  Your physician has recommended you make the following change in your medication:   START: metoprolol tartrate (lopressor) 25 mg tablet: Take 1 tablet by mouth twice a day   If you need a refill on your cardiac medications before your next appointment, please call your pharmacy.   Lab work: None Ordered  If you have labs (blood work) drawn today and your tests are completely normal, you will receive your results only by: Marland Kitchen MyChart Message (if you have MyChart) OR . A paper copy in the mail If you have any lab test that is abnormal or we need to change your treatment, we will call you to review the results.  Testing/Procedures: Your physician has requested that you have an echocardiogram on 02/07/19 at 3:05 PM. Arrive at 2:50 PM. Echocardiography is a painless test that uses sound waves to create images of your heart. It provides your doctor with information about the size and shape of your heart and how well your heart's chambers and valves are working. This procedure takes approximately one hour. There are no restrictions for this procedure.  Follow-Up: Follow up with Dr. Acie Fredrickson on 05/10/19 at 2:20 PM in the office  Any Other Special Instructions Will Be Listed Below (If Applicable).

## 2019-02-02 NOTE — Progress Notes (Signed)
Cardiology Office Note:    Date:  02/02/2019   ID:  [name], DOB 1966-04-15, MRN [id number]  PCP:  Patient, No Pcp Per  Cardiologist:  N[place]  Electrophysiologist:  None   Referring MD: No ref. provider found   Chief Complaint  Patient presents with  . Palpitations    Aug. 19, 2020     Alyssa Holmes is a 53 y.o. female with a hx of MVP and palpitations She was seen via telemedicine in April, 2020 She has a hx of MVP.   Started having palpitations several months ago.  Not related to exercise Clinically the palpitations sound c/w PVCs. Advised increasing her intake of potassium ( V8)  Advised regular exercise  Still has occasional palps  Is not exercising ,    Has occasional hot flashes Is on synthroid - TSH 3 months ago was normal .    Past Medical History:  Diagnosis Date  . Bipolar disorder ([place])   . Bundle branch block   . Elevated cholesterol    dx in early 74s  . Endometriosis   . Female bladder prolapse   . Fibroid   . Headache(784.0)   . Hypothyroidism   . Mitral valve prolapse    during adolescence  . Ovarian torsion   . Pancreatitis   . Shortness of breath     Past Surgical History:  Procedure Laterality Date  . ABDOMINAL HYSTERECTOMY    . APPENDECTOMY    . CHOLECYSTECTOMY    . COLONOSCOPY    . DILATION AND CURETTAGE OF UTERUS    . ESOPHAGOGASTRODUODENOSCOPY (EGD) WITH PROPOFOL N/A 09/09/2013   Procedure: ESOPHAGOGASTRODUODENOSCOPY (EGD) WITH PROPOFOL;  Surgeon: Arta Silence, MD;  Location: [facility] ENDOSCOPY;  Service: Endoscopy;  Laterality: N/A;  . EUS N/A 09/09/2013   Procedure: ESOPHAGEAL ENDOSCOPIC ULTRASOUND (EUS) RADIAL;  Surgeon: Arta Silence, MD;  Location: [facility] ENDOSCOPY;  Service: Endoscopy;  Laterality: N/A;  . HERNIA REPAIR    . LAPAROSCOPY Left 11/15/2013   Procedure: LAPAROSCOPY OPERATIVE WITH LEFT SALPINGO-OOPHORECTOMY/LYSIS OF ADHESIONS/REMOVAL OF LEFT OVARIAN CYST;  Surgeon: Betsy Coder, MD;  Location: [facility];   Service: Gynecology;  Laterality: Left;  . OOPHORECTOMY      Current Medications: Current Meds  Medication Sig  . acetaminophen (TYLENOL) 500 MG tablet Take 1,000 mg by mouth every 6 (six) hours as needed for mild pain.  . B Complex-C (B-COMPLEX WITH VITAMIN C) tablet Take 1 tablet by mouth daily.  Marland Kitchen BIOTIN PO Take 1 tablet by mouth daily.  . Cholecalciferol (VITAMIN D3) 2000 UNITS TABS Take 2 tablets by mouth daily.   Marland Kitchen lamoTRIgine (LAMICTAL) 200 MG tablet Take 1 tablet by mouth every evening.  Marland Kitchen levothyroxine (SYNTHROID) 75 MCG tablet Take 1 tablet by mouth daily.  Marland Kitchen LORazepam (ATIVAN) 0.5 MG tablet Take 0.5 mg by mouth daily as needed for anxiety or sleep.   Marland Kitchen MAGNESIUM PO Take 1 tablet by mouth daily.  . Milk Thistle 200 MG CAPS Take 1 capsule by mouth daily.  Marland Kitchen NIACIN PO Take 500 mg by mouth daily.   Marland Kitchen venlafaxine XR (EFFEXOR-XR) 150 MG 24 hr capsule Take 1 capsule by mouth daily.  . [DISCONTINUED] venlafaxine XR (EFFEXOR-XR) 75 MG 24 hr capsule Take 75 mg by mouth daily with breakfast.     Allergies:   Vancomycin, Ibuprofen, Oxycodone-acetaminophen, Toradol [ketorolac tromethamine], and Penicillins   Social History   Socioeconomic History  . Marital status: Married    Spouse name: Not on file  .  Number of children: Not on file  . Years of education: Not on file  . Highest education level: Not on file  Occupational History  . Not on file  Social Needs  . Financial resource strain: Not on file  . Food insecurity    Worry: Not on file    Inability: Not on file  . Transportation needs    Medical: Not on file    Non-medical: Not on file  Tobacco Use  . Smoking status: Former Smoker    Types: Cigarettes  . Smokeless tobacco: Never Used  Substance and Sexual Activity  . Alcohol use: Yes    Comment: couple drinks a week  . Drug use: No  . Sexual activity: Yes    Birth control/protection: Post-menopausal  Lifestyle  . Physical activity    Days per week: Not on file     Minutes per session: Not on file  . Stress: Not on file  Relationships  . Social Herbalist on phone: Not on file    Gets together: Not on file    Attends religious service: Not on file    Active member of club or organization: Not on file    Attends meetings of clubs or organizations: Not on file    Relationship status: Not on file  Other Topics Concern  . Not on file  Social History Narrative  . Not on file     Family History: The patient's family history includes Asthma in her father; Cancer - Colon in an other family member; Diabetes in her mother; Heart disease in her mother; Stroke in her father; Thyroid disease in her mother.  ROS:   Please see the history of present illness.     All other systems reviewed and are negative.  EKGs/Labs/Other Studies Reviewed:    The following studies were reviewed today:   EKG:     Recent Labs: 07/19/2018: ALT 32 09/27/2018: BUN 11; Creatinine, Ser 0.81; Hemoglobin 14.3; Platelets 258; Potassium 3.8; Sodium 140; TSH 0.683  Recent Lipid Panel    Component Value Date/Time   CHOL 218 (H) 09/06/2013 1318   TRIG 403 (H) 09/09/2013 1312   HDL 30 (L) 09/06/2013 1318   CHOLHDL 7.3 09/06/2013 1318   VLDL UNABLE TO CALCULATE IF TRIGLYCERIDE OVER 400 mg/dL 09/06/2013 1318   LDLCALC UNABLE TO CALCULATE IF TRIGLYCERIDE OVER 400 mg/dL 09/06/2013 1318    Physical Exam:    VS:  BP 110/68   Pulse (!) 106   Ht 5\' 5"  (1.651 m)   Wt 155 lb 12.8 oz (70.7 kg)   SpO2 95%   BMI 25.93 kg/m     Wt Readings from Last 3 Encounters:  02/02/19 155 lb 12.8 oz (70.7 kg)  10/11/18 150 lb (68 kg)  09/27/18 151 lb (68.5 kg)     GEN:   Well nourished, well developed in no acute distress HEENT: Normal NECK: No JVD; No carotid bruits LYMPHATICS: No lymphadenopathy CARDIAC: RRR, no murmurs, rubs, gallops RESPIRATORY:  Clear to auscultation without rales, wheezing or rhonchi  ABDOMEN: Soft, non-tender, non-distended MUSCULOSKELETAL:  No  edema; No deformity  SKIN: Warm and dry NEUROLOGIC:  Alert and oriented x 3 PSYCHIATRIC:  Normal affect   ASSESSMENT:    1. Palpitations   2. Tachycardia    PLAN:    In order of problems listed above:  1. Tachycardia:   HR is 106.   Synthroid dose has been reduced.   We discussed the possibility that  this could be a hormonal issue.   Will start metoprolol 25 mg PO BID .    2. Hormonal changes:   Has had hot flashes .   Breasts have enlarged and have become more firm.   I suggested that she consult with her primary MD or endocrinologist to see she has an endocrine issue.    Medication Adjustments/Labs and Tests Ordered: Current medicines are reviewed at length with the patient today.  Concerns regarding medicines are outlined above.  Orders Placed This Encounter  Procedures  . ECHOCARDIOGRAM COMPLETE   Meds ordered this encounter  Medications  . metoprolol tartrate (LOPRESSOR) 25 MG tablet    Sig: Take 1 tablet (25 mg total) by mouth 2 (two) times daily.    Dispense:  180 tablet    Refill:  3    Patient Instructions  Medication Instructions:  Your physician has recommended you make the following change in your medication:   START: metoprolol tartrate (lopressor) 25 mg tablet: Take 1 tablet by mouth twice a day   If you need a refill on your cardiac medications before your next appointment, please call your pharmacy.   Lab work: None Ordered  If you have labs (blood work) drawn today and your tests are completely normal, you will receive your results only by: Marland Kitchen MyChart Message (if you have MyChart) OR . A paper copy in the mail If you have any lab test that is abnormal or we need to change your treatment, we will call you to review the results.  Testing/Procedures: Your physician has requested that you have an echocardiogram on 02/07/19 at 3:05 PM. Arrive at 2:50 PM. Echocardiography is a painless test that uses sound waves to create images of your heart. It provides  your doctor with information about the size and shape of your heart and how well your heart's chambers and valves are working. This procedure takes approximately one hour. There are no restrictions for this procedure.  Follow-Up: Follow up with Dr. Acie Fredrickson on 05/10/19 at 2:20 PM in the office  Any Other Special Instructions Will Be Listed Below (If Applicable).       Signed, Mertie Moores, MD  02/02/2019 5:40 PM    [place]

## 2019-02-07 ENCOUNTER — Other Ambulatory Visit: Payer: Self-pay

## 2019-02-07 ENCOUNTER — Ambulatory Visit (HOSPITAL_COMMUNITY): Payer: Federal, State, Local not specified - PPO | Attending: Internal Medicine

## 2019-02-07 DIAGNOSIS — R000 Tachycardia, unspecified: Secondary | ICD-10-CM | POA: Insufficient documentation

## 2019-02-07 DIAGNOSIS — R002 Palpitations: Secondary | ICD-10-CM | POA: Insufficient documentation

## 2019-02-14 ENCOUNTER — Other Ambulatory Visit: Payer: Self-pay | Admitting: Physician Assistant

## 2019-02-14 DIAGNOSIS — Z1231 Encounter for screening mammogram for malignant neoplasm of breast: Secondary | ICD-10-CM

## 2019-03-01 DIAGNOSIS — N644 Mastodynia: Secondary | ICD-10-CM | POA: Diagnosis not present

## 2019-03-01 DIAGNOSIS — E041 Nontoxic single thyroid nodule: Secondary | ICD-10-CM | POA: Diagnosis not present

## 2019-03-01 DIAGNOSIS — E039 Hypothyroidism, unspecified: Secondary | ICD-10-CM | POA: Diagnosis not present

## 2019-03-01 DIAGNOSIS — N952 Postmenopausal atrophic vaginitis: Secondary | ICD-10-CM | POA: Diagnosis not present

## 2019-03-02 DIAGNOSIS — G4733 Obstructive sleep apnea (adult) (pediatric): Secondary | ICD-10-CM | POA: Diagnosis not present

## 2019-03-04 DIAGNOSIS — N952 Postmenopausal atrophic vaginitis: Secondary | ICD-10-CM | POA: Diagnosis not present

## 2019-03-04 DIAGNOSIS — E039 Hypothyroidism, unspecified: Secondary | ICD-10-CM | POA: Diagnosis not present

## 2019-03-04 DIAGNOSIS — N644 Mastodynia: Secondary | ICD-10-CM | POA: Diagnosis not present

## 2019-03-24 ENCOUNTER — Other Ambulatory Visit: Payer: Self-pay

## 2019-03-24 ENCOUNTER — Ambulatory Visit: Payer: Federal, State, Local not specified - PPO | Attending: Internal Medicine | Admitting: Audiology

## 2019-03-24 DIAGNOSIS — H9325 Central auditory processing disorder: Secondary | ICD-10-CM | POA: Diagnosis not present

## 2019-03-24 DIAGNOSIS — H93293 Other abnormal auditory perceptions, bilateral: Secondary | ICD-10-CM

## 2019-03-24 DIAGNOSIS — H93299 Other abnormal auditory perceptions, unspecified ear: Secondary | ICD-10-CM

## 2019-03-24 DIAGNOSIS — H833X3 Noise effects on inner ear, bilateral: Secondary | ICD-10-CM | POA: Diagnosis not present
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
## 2019-03-29 ENCOUNTER — Ambulatory Visit: Payer: Federal, State, Local not specified - PPO

## 2019-03-30 ENCOUNTER — Other Ambulatory Visit: Payer: Self-pay

## 2019-03-30 ENCOUNTER — Ambulatory Visit
Admission: RE | Admit: 2019-03-30 | Discharge: 2019-03-30 | Disposition: A | Discharge status: Home / Self Care | Payer: Federal, State, Local not specified - PPO | Source: Ambulatory Visit | Attending: Physician Assistant | Admitting: Physician Assistant

## 2019-03-30 DIAGNOSIS — Z1231 Encounter for screening mammogram for malignant neoplasm of breast: Secondary | ICD-10-CM | POA: Diagnosis not present

## 2019-05-09 NOTE — Progress Notes (Signed)
Cardiology Office Note:    Date:  05/10/2019   ID:  Alyssa Holmes, DOB Feb 19, 1966, MRN [id number]  PCP:  Alyssa Bal, PA-C  Cardiologist:  Alyssa Holmes  Electrophysiologist:  None   Referring MD: No ref. provider found   Chief Complaint  Patient presents with  . Palpitations    Aug. 19, 2020     Alyssa Holmes is a 53 y.o. female with a hx of MVP and palpitations She was seen via telemedicine in April, 2020 She has a hx of MVP.   Started having palpitations several months ago.  Not related to exercise Clinically the palpitations sound c/w PVCs. Advised increasing her intake of potassium ( V8)  Advised regular exercise  Still has occasional palps  Is not exercising ,    Has occasional hot flashes Is on synthroid - TSH 3 months ago was normal .   Alyssa Holmes is seen today for follow up of her MVP and episodes o fpalpitations    May 10, 2019:  Has mvp with palpitations  we gave her metoprolol 25 mg bid  She is taking only once a day  - she cut the dose due to fatigue and constipation .  She has started exercising   Past Medical History:  Diagnosis Date  . Bipolar disorder ([place])   . Bundle branch block   . Elevated cholesterol    dx in early 51s  . Endometriosis   . Female bladder prolapse   . Fibroid   . Headache(784.0)   . Hypothyroidism   . Mitral valve prolapse    during adolescence  . Ovarian torsion   . Pancreatitis   . Shortness of breath     Past Surgical History:  Procedure Laterality Date  . ABDOMINAL HYSTERECTOMY    . APPENDECTOMY    . CHOLECYSTECTOMY    . COLONOSCOPY    . DILATION AND CURETTAGE OF UTERUS    . ESOPHAGOGASTRODUODENOSCOPY (EGD) WITH PROPOFOL N/A 09/09/2013   Procedure: ESOPHAGOGASTRODUODENOSCOPY (EGD) WITH PROPOFOL;  Surgeon: Arta Silence, MD;  Location: [facility] ENDOSCOPY;  Service: Endoscopy;  Laterality: N/A;  . EUS N/A 09/09/2013   Procedure: ESOPHAGEAL ENDOSCOPIC ULTRASOUND (EUS) RADIAL;  Surgeon: Arta Silence, MD;  Location: [facility] ENDOSCOPY;  Service: Endoscopy;  Laterality: N/A;  . HERNIA REPAIR    . LAPAROSCOPY Left 11/15/2013   Procedure: LAPAROSCOPY OPERATIVE WITH LEFT SALPINGO-OOPHORECTOMY/LYSIS OF ADHESIONS/REMOVAL OF LEFT OVARIAN CYST;  Surgeon: Betsy Coder, MD;  Location: [facility];  Service: Gynecology;  Laterality: Left;  . OOPHORECTOMY      Current Medications: Current Meds  Medication Sig  . acetaminophen (TYLENOL) 500 MG tablet Take 1,000 mg by mouth every 6 (six) hours as needed for mild pain.  . B Complex-C (B-COMPLEX WITH VITAMIN C) tablet Take 1 tablet by mouth daily.  Marland Kitchen BIOTIN PO Take 1 tablet by mouth daily.  . Cholecalciferol (VITAMIN D3) 2000 UNITS TABS Take 2 tablets by mouth daily.   Marland Kitchen lamoTRIgine (LAMICTAL) 200 MG tablet Take 1 tablet by mouth every evening.  Marland Kitchen levothyroxine (SYNTHROID) 75 MCG tablet Take 1 tablet by mouth daily.  Marland Kitchen LORazepam (ATIVAN) 0.5 MG tablet Take 0.5 mg by mouth daily as needed for anxiety or sleep.   Marland Kitchen MAGNESIUM PO Take 1 tablet by mouth daily.  Marland Kitchen NIACIN PO Take 500 mg by mouth daily.   Marland Kitchen venlafaxine XR (EFFEXOR-XR) 150 MG 24 hr capsule Take 1 capsule by mouth daily.  . [DISCONTINUED] metoprolol tartrate (LOPRESSOR) 25 MG tablet Take  25 mg by mouth daily.     Allergies:   Vancomycin, Ibuprofen, Oxycodone-acetaminophen, Toradol [ketorolac tromethamine], and Penicillins   Social History   Socioeconomic History  . Marital status: Married    Spouse name: Not on file  . Number of children: Not on file  . Years of education: Not on file  . Highest education level: Not on file  Occupational History  . Not on file  Social Needs  . Financial resource strain: Not on file  . Food insecurity    Worry: Not on file    Inability: Not on file  . Transportation needs    Medical: Not on file    Non-medical: Not on file  Tobacco Use  . Smoking status: Former Smoker    Types: Cigarettes  . Smokeless tobacco: Never Used  Substance and Sexual  Activity  . Alcohol use: Yes    Comment: couple drinks a week  . Drug use: No  . Sexual activity: Yes    Birth control/protection: Post-menopausal  Lifestyle  . Physical activity    Days per week: Not on file    Minutes per session: Not on file  . Stress: Not on file  Relationships  . Social Herbalist on phone: Not on file    Gets together: Not on file    Attends religious service: Not on file    Active member of club or organization: Not on file    Attends meetings of clubs or organizations: Not on file    Relationship status: Not on file  Other Topics Concern  . Not on file  Social History Narrative  . Not on file     Family History: The patient's family history includes Asthma in her father; Cancer - Colon in an other family member; Diabetes in her mother; Heart disease in her mother; Stroke in her father; Thyroid disease in her mother.  ROS:   Please see the history of present illness.     All other systems reviewed and are negative.  EKGs/Labs/Other Studies Reviewed:    The following studies were reviewed today:   EKG:     Recent Labs: 07/19/2018: ALT 32 09/27/2018: BUN 11; Creatinine, Ser 0.81; Hemoglobin 14.3; Platelets 258; Potassium 3.8; Sodium 140; TSH 0.683  Recent Lipid Panel    Component Value Date/Time   CHOL 218 (H) 09/06/2013 1318   TRIG 403 (H) 09/09/2013 1312   HDL 30 (L) 09/06/2013 1318   CHOLHDL 7.3 09/06/2013 1318   VLDL UNABLE TO CALCULATE IF TRIGLYCERIDE OVER 400 mg/dL 09/06/2013 1318   LDLCALC UNABLE TO CALCULATE IF TRIGLYCERIDE OVER 400 mg/dL 09/06/2013 1318    Physical Exam:    Physical Exam: Blood pressure 120/82, pulse 79, height 5\' 5"  (1.651 m), weight 157 lb (71.2 kg), SpO2 99 %.  GEN:  Well nourished, well developed in no acute distress HEENT: Normal NECK: No JVD; No carotid bruits LYMPHATICS: No lymphadenopathy CARDIAC: RRR , no murmurs, rubs, gallops RESPIRATORY:  Clear to auscultation without rales, wheezing or  rhonchi  ABDOMEN: Soft, non-tender, non-distended MUSCULOSKELETAL:  No edema; No deformity  SKIN: Warm and dry NEUROLOGIC:  Alert and oriented x 3     ASSESSMENT:    No diagnosis found. PLAN:    In order of problems listed above:  1. Tachycardia:   She is doing very well on metoprolol.  She has decreased her dose to 25 mg once a day and overall this seems to be doing well.  We  discussed changing her dosage to 12.5 mg twice a day or possibly changing to the Toprol-XL 25 mg once a day.  She would like to try the Toprol-XL once a day.  She seems to be feeling quite a bit better.  We will see her again in 1 year.  Medication Adjustments/Labs and Tests Ordered: Current medicines are reviewed at length with the patient today.  Concerns regarding medicines are outlined above.  No orders of the defined types were placed in this encounter.  Meds ordered this encounter  Medications  . metoprolol succinate (TOPROL XL) 25 MG 24 hr tablet    Sig: Take 1 tablet (25 mg total) by mouth daily.    Dispense:  90 tablet    Refill:  3    Patient Instructions  Medication Instructions:  Your physician has recommended you make the following change in your medication:  STOP Metoprolol tartrate (Lopressor) START Metoprolol succinate (Toprol XL) 25 mg once daily  *If you need a refill on your cardiac medications before your next appointment, please call your pharmacy*  Lab Work: None Ordered    Testing/Procedures: None Ordered   Follow-Up: At [facility], you and your health needs are our priority.  As part of our continuing mission to provide you with exceptional heart care, we have created designated Provider Care Teams.  These Care Teams include your primary Cardiologist (physician) and Advanced Practice Providers (APPs -  Physician Assistants and Nurse Practitioners) who all work together to provide you with the care you need, when you need it.  Your next appointment:   1  year(s)  The format for your next appointment:   In Person  Provider:   You may see Dr. Acie Fredrickson or one of the following Advanced Practice Providers on your designated Care Team:    Richardson Dopp, PA-C  Vin Elliott, [place]  Daune Perch, [place]       Signed, Mertie Moores, MD  05/10/2019 5:10 PM    [place]

## 2019-05-10 ENCOUNTER — Ambulatory Visit: Payer: Federal, State, Local not specified - PPO | Admitting: Cardiovascular Disease

## 2019-05-10 ENCOUNTER — Encounter: Payer: Self-pay | Admitting: Cardiovascular Disease

## 2019-05-10 ENCOUNTER — Other Ambulatory Visit: Payer: Self-pay

## 2019-05-10 VITALS — BP 120/82 | HR 79 | Ht 65.0 in | Wt 157.0 lb

## 2019-05-10 DIAGNOSIS — R002 Palpitations: Secondary | ICD-10-CM | POA: Diagnosis not present

## 2019-05-10 MED ORDER — METOPROLOL SUCCINATE ER 25 MG PO TB24: 25.0000 mg | ORAL_TABLET | Freq: Every day | ORAL | 3 refills | Status: DC

## 2019-05-10 NOTE — Patient Instructions (Addendum)
Medication Instructions:  Your physician has recommended you make the following change in your medication:  STOP Metoprolol tartrate (Lopressor) START Metoprolol succinate (Toprol XL) 25 mg once daily  *If you need a refill on your cardiac medications before your next appointment, please call your pharmacy*  Lab Work: None Ordered    Testing/Procedures: None Ordered   Follow-Up: At [facility], you and your health needs are our priority.  As part of our continuing mission to provide you with exceptional heart care, we have created designated Provider Care Teams.  These Care Teams include your primary Cardiologist (physician) and Advanced Practice Providers (APPs -  Physician Assistants and Nurse Practitioners) who all work together to provide you with the care you need, when you need it.  Your next appointment:   1 year(s)  The format for your next appointment:   In Person  Provider:   You may see Dr. Acie Fredrickson or one of the following Advanced Practice Providers on your designated Care Team:    Richardson Dopp, PA-C  [place], [place]  Daune Perch, [place]

## 2019-07-01 DIAGNOSIS — G4733 Obstructive sleep apnea (adult) (pediatric): Secondary | ICD-10-CM | POA: Diagnosis not present

## 2019-07-05 DIAGNOSIS — M9902 Segmental and somatic dysfunction of thoracic region: Secondary | ICD-10-CM | POA: Diagnosis not present

## 2019-07-05 DIAGNOSIS — M531 Cervicobrachial syndrome: Secondary | ICD-10-CM | POA: Diagnosis not present

## 2019-07-05 DIAGNOSIS — M9903 Segmental and somatic dysfunction of lumbar region: Secondary | ICD-10-CM | POA: Diagnosis not present

## 2019-07-05 DIAGNOSIS — M9901 Segmental and somatic dysfunction of cervical region: Secondary | ICD-10-CM | POA: Diagnosis not present

## 2019-08-10 DIAGNOSIS — M531 Cervicobrachial syndrome: Secondary | ICD-10-CM | POA: Diagnosis not present

## 2019-08-10 DIAGNOSIS — M9901 Segmental and somatic dysfunction of cervical region: Secondary | ICD-10-CM | POA: Diagnosis not present

## 2019-08-10 DIAGNOSIS — M9902 Segmental and somatic dysfunction of thoracic region: Secondary | ICD-10-CM | POA: Diagnosis not present

## 2019-08-10 DIAGNOSIS — M9903 Segmental and somatic dysfunction of lumbar region: Secondary | ICD-10-CM | POA: Diagnosis not present

## 2019-08-16 DIAGNOSIS — J0110 Acute frontal sinusitis, unspecified: Secondary | ICD-10-CM | POA: Diagnosis not present

## 2019-08-16 DIAGNOSIS — J029 Acute pharyngitis, unspecified: Secondary | ICD-10-CM | POA: Diagnosis not present

## 2019-08-30 DIAGNOSIS — Z7189 Other specified counseling: Secondary | ICD-10-CM | POA: Diagnosis not present

## 2019-08-30 DIAGNOSIS — E039 Hypothyroidism, unspecified: Secondary | ICD-10-CM | POA: Diagnosis not present

## 2019-08-30 DIAGNOSIS — E041 Nontoxic single thyroid nodule: Secondary | ICD-10-CM | POA: Diagnosis not present

## 2019-08-31 DIAGNOSIS — F419 Anxiety disorder, unspecified: Secondary | ICD-10-CM | POA: Diagnosis not present

## 2019-08-31 DIAGNOSIS — G4733 Obstructive sleep apnea (adult) (pediatric): Secondary | ICD-10-CM | POA: Diagnosis not present

## 2019-09-07 DIAGNOSIS — M9901 Segmental and somatic dysfunction of cervical region: Secondary | ICD-10-CM | POA: Diagnosis not present

## 2019-09-07 DIAGNOSIS — M9902 Segmental and somatic dysfunction of thoracic region: Secondary | ICD-10-CM | POA: Diagnosis not present

## 2019-09-07 DIAGNOSIS — M531 Cervicobrachial syndrome: Secondary | ICD-10-CM | POA: Diagnosis not present

## 2019-09-07 DIAGNOSIS — M9903 Segmental and somatic dysfunction of lumbar region: Secondary | ICD-10-CM | POA: Diagnosis not present

## 2019-10-05 DIAGNOSIS — M9903 Segmental and somatic dysfunction of lumbar region: Secondary | ICD-10-CM | POA: Diagnosis not present

## 2019-10-05 DIAGNOSIS — M531 Cervicobrachial syndrome: Secondary | ICD-10-CM | POA: Diagnosis not present

## 2019-10-05 DIAGNOSIS — M9902 Segmental and somatic dysfunction of thoracic region: Secondary | ICD-10-CM | POA: Diagnosis not present

## 2019-10-05 DIAGNOSIS — M9901 Segmental and somatic dysfunction of cervical region: Secondary | ICD-10-CM | POA: Diagnosis not present

## 2019-11-02 DIAGNOSIS — M9902 Segmental and somatic dysfunction of thoracic region: Secondary | ICD-10-CM | POA: Diagnosis not present

## 2019-11-02 DIAGNOSIS — M9903 Segmental and somatic dysfunction of lumbar region: Secondary | ICD-10-CM | POA: Diagnosis not present

## 2019-11-02 DIAGNOSIS — M531 Cervicobrachial syndrome: Secondary | ICD-10-CM | POA: Diagnosis not present

## 2019-11-02 DIAGNOSIS — M9901 Segmental and somatic dysfunction of cervical region: Secondary | ICD-10-CM | POA: Diagnosis not present

## 2019-12-02 DIAGNOSIS — M9903 Segmental and somatic dysfunction of lumbar region: Secondary | ICD-10-CM | POA: Diagnosis not present

## 2019-12-02 DIAGNOSIS — M531 Cervicobrachial syndrome: Secondary | ICD-10-CM | POA: Diagnosis not present

## 2019-12-02 DIAGNOSIS — M9902 Segmental and somatic dysfunction of thoracic region: Secondary | ICD-10-CM | POA: Diagnosis not present

## 2019-12-02 DIAGNOSIS — M9901 Segmental and somatic dysfunction of cervical region: Secondary | ICD-10-CM | POA: Diagnosis not present

## 2019-12-12 DIAGNOSIS — G4733 Obstructive sleep apnea (adult) (pediatric): Secondary | ICD-10-CM | POA: Diagnosis not present

## 2020-01-06 DIAGNOSIS — M531 Cervicobrachial syndrome: Secondary | ICD-10-CM | POA: Diagnosis not present

## 2020-01-06 DIAGNOSIS — M9901 Segmental and somatic dysfunction of cervical region: Secondary | ICD-10-CM | POA: Diagnosis not present

## 2020-01-06 DIAGNOSIS — M9903 Segmental and somatic dysfunction of lumbar region: Secondary | ICD-10-CM | POA: Diagnosis not present

## 2020-01-06 DIAGNOSIS — M9902 Segmental and somatic dysfunction of thoracic region: Secondary | ICD-10-CM | POA: Diagnosis not present

## 2020-01-17 DIAGNOSIS — Z20822 Contact with and (suspected) exposure to covid-19: Secondary | ICD-10-CM | POA: Diagnosis not present

## 2020-01-17 DIAGNOSIS — Z03818 Encounter for observation for suspected exposure to other biological agents ruled out: Secondary | ICD-10-CM | POA: Diagnosis not present

## 2020-02-09 DIAGNOSIS — E039 Hypothyroidism, unspecified: Secondary | ICD-10-CM | POA: Diagnosis not present

## 2020-02-09 DIAGNOSIS — R232 Flushing: Secondary | ICD-10-CM | POA: Diagnosis not present

## 2020-02-09 DIAGNOSIS — E041 Nontoxic single thyroid nodule: Secondary | ICD-10-CM | POA: Diagnosis not present

## 2020-02-10 DIAGNOSIS — M9903 Segmental and somatic dysfunction of lumbar region: Secondary | ICD-10-CM | POA: Diagnosis not present

## 2020-02-10 DIAGNOSIS — M531 Cervicobrachial syndrome: Secondary | ICD-10-CM | POA: Diagnosis not present

## 2020-02-10 DIAGNOSIS — M9902 Segmental and somatic dysfunction of thoracic region: Secondary | ICD-10-CM | POA: Diagnosis not present

## 2020-02-10 DIAGNOSIS — M9901 Segmental and somatic dysfunction of cervical region: Secondary | ICD-10-CM | POA: Diagnosis not present

## 2020-02-27 ENCOUNTER — Other Ambulatory Visit: Payer: Self-pay | Admitting: Anesthesiology

## 2020-02-27 ENCOUNTER — Other Ambulatory Visit: Payer: Self-pay | Admitting: Family Medicine

## 2020-02-27 ENCOUNTER — Ambulatory Visit: Admit: 2020-02-27 | Discharge: 2020-02-28 | Payer: BLUE CROSS/BLUE SHIELD | Primary: Family Medicine

## 2020-02-27 ENCOUNTER — Institutional Professional Consult (permissible substitution)
Admit: 2020-02-27 | Discharge: 2020-02-27 | Payer: BLUE CROSS/BLUE SHIELD | Attending: Surgery | Primary: Family Medicine

## 2020-02-27 DIAGNOSIS — R1011 Right upper quadrant pain: Secondary | ICD-10-CM

## 2020-02-27 DIAGNOSIS — Z1231 Encounter for screening mammogram for malignant neoplasm of breast: Secondary | ICD-10-CM

## 2020-02-27 NOTE — Progress Notes (Signed)
General Surgery History and Physical    Patient's Name/Date of Birth: Lauren Hull / [birth year]    Date: February 27, 2020     Surgeon: Haskell Flirt M.D.    PCP: Neoma Laming Bufford Spikes, DO     Chief Complaint: ruq pain     HPI:   Lauren Hull is a 54 y.o. female who presents for evaluation of ruq pain that radiates to back intermittently. Timing is intermittent and usually postprandially, radiation to right side and back, alleviated by nothing and started weeks ago and severity is 2-9/10      Past Medical History:   Diagnosis Date   ??? Anxiety    ??? Arthritis    ??? Bronchitis    ??? PONV (postoperative nausea and vomiting)     with anesthesia       Past Surgical History:   Procedure Laterality Date   ??? ECTOPIC PREGNANCY SURGERY  1986   ??? NERVE BLOCK Left 07/20/2015    transforaminal nerve, lumbar #1   ??? NERVE BLOCK Left 08/17/2015   ??? NERVE BLOCK Left 09/06/2015    lumbar tfnb #3   ??? NERVE BLOCK Left 11/01/2015    medial branch block, lumbar #1   ??? NERVE BLOCK Right 11/15/2015    MBB #2   ??? NERVE BLOCK Left 02/06/2016    lumbar radiofrequency    ??? TUBAL LIGATION     ??? VEIN SURGERY         Current Outpatient Medications   Medication Sig Dispense Refill   ??? buPROPion (WELLBUTRIN XL) 150 MG extended release tablet TK 1 T PO QD  0   ??? acetaminophen (TYLENOL) 325 MG tablet Take 650 mg by mouth every 6 hours as needed for Pain     ??? sertraline (ZOLOFT) 50 MG tablet take 1 tablet by mouth once daily  0     No current facility-administered medications for this visit.       Allergies   Allergen Reactions   ??? Ibuprofen Shortness Of Breath and Itching       The patient has a family history that is negative for severe cardiovascular or respiratory issues, negative for reaction to anesthesia.    Social History     Socioeconomic History   ??? Marital status: Single     Spouse name: Not on file   ??? Number of children: Not on file   ??? Years of education: Not on file   ??? Highest education level: Not on file   Occupational  History   ??? Not on file   Tobacco Use   ??? Smoking status: Current Every Day Smoker     Packs/day: 0.50     Years: 30.00     Pack years: 15.00     Types: Cigarettes   ??? Smokeless tobacco: Never Used   Substance and Sexual Activity   ??? Alcohol use: No   ??? Drug use: No   ??? Sexual activity: Not on file   Other Topics Concern   ??? Not on file   Social History Narrative   ??? Not on file     Social Determinants of Health     Financial Resource Strain:    ??? Difficulty of Paying Living Expenses:    Food Insecurity:    ??? Worried About Programme researcher, broadcasting/film/video in the Last Year:    ??? Ran Out of Food in the Last Year:    Transportation Needs:    ??? Lack of  Transportation (Medical):    ??? Lack of Transportation (Non-Medical):    Physical Activity:    ??? Days of Exercise per Week:    ??? Minutes of Exercise per Session:    Stress:    ??? Feeling of Stress :    Social Connections:    ??? Frequency of Communication with Friends and Family:    ??? Frequency of Social Gatherings with Friends and Family:    ??? Attends Religious Services:    ??? Database administrator or Organizations:    ??? Attends Engineer, structural:    ??? Marital Status:    Catering manager Violence:    ??? Fear of Current or Ex-Partner:    ??? Emotionally Abused:    ??? Physically Abused:    ??? Sexually Abused:            Review of Systems  Review of Systems -  General ROS: negative for - chills, fatigue or malaise  ENT ROS: negative for - hearing change, nasal congestion or nasal discharge  Allergy and Immunology ROS: negative for - hives, itchy/watery eyes or nasal congestion  Hematological and Lymphatic ROS: negative for - blood clots, blood transfusions, bruising or fatigue  Endocrine ROS: negative for - malaise/lethargy, mood swings, palpitations or polydipsia/polyuria  Respiratory ROS: negative for - sputum changes, stridor, tachypnea or wheezing  Cardiovascular ROS: negative for - irregular heartbeat, loss of consciousness, murmur or orthopnea  Gastrointestinal ROS: negative  for - constipation, diarrhea, gas/bloating, heartburn or hematemesis  Genito-Urinary ROS: negative for -  genital discharge, genital ulcers or hematuria  Musculoskeletal ROS: negative for - gait disturbance, muscle pain or muscular weakness    Physical exam:   BP 127/72 (Site: Left Upper Arm, Position: Sitting, Cuff Size: Medium Adult)    Pulse 70    Temp 97.8 ??F (36.6 ??C)    Ht 5\' 7"  (1.702 m)    Wt 204 lb (92.5 kg)    BMI 31.95 kg/m??   General appearance:  NAD  Head: NCAT, PERRLA, EOMI, red conjunctiva  Neck: supple, no masses  Lungs: CTAB, equal chest rise bilateral  Heart: Reg rate  Abdomen: soft, nontender, nondistended  Skin; no lesions  Gu: no cva tenderness  Extremities: extremities normal, atraumatic, no cyanosis or edema      Assessment:  54 y.o. female with ruq pain     Plan:  Will get usg of gb and if this is normal will do EGD to assess.  57, MD  2:31 PM  02/27/2020

## 2020-02-28 ENCOUNTER — Encounter

## 2020-02-29 DIAGNOSIS — R05 Cough: Secondary | ICD-10-CM | POA: Diagnosis not present

## 2020-02-29 NOTE — Telephone Encounter (Signed)
Prior Authorization Form:      DEMOGRAPHICS:                     Patient Name:  Lauren Hull  Patient DOB:  Jul 21, 1965            Insurance:  Payor: BCBS / Plan: [employer] / Product Type: *No Product type* /   Insurance ID Number:    Payor/Plan Subscr DOB Sex Relation Sub. Ins. ID Effective Group Num   1. [place] Feb 17, 1966 Female Self [id number] 06/17/19                                    [id number]         DIAGNOSIS & PROCEDURE:                       Procedure/Operation: EGD possible biopsy           CPT Code: 71696    Diagnosis:  RUQ pain    ICD10 Code: R10.11    Location:  [place]    Surgeon:  Vincenza Hews INFORMATION:                          Date: 03/16/20    Time: TBD              Anesthesia:  MAC/TIVA                                                       Status:  Outpatient        Special Comments:         Electronically signed by Jones Bales, RN on 02/29/2020 at 1:55 PM

## 2020-02-29 NOTE — Telephone Encounter (Signed)
Per Dr. Daphine Deutscher, patient is scheduled for HIDA scan at [facility] on 03/30/20 and EGD possible biopsy at [facility] on 03/16/20. Surgery scheduling form faxed with confirmation, surgeon's calendar updated. Dr. Daphine Deutscher to enter orders. Follow up appointment scheduled. Patient has not received COVID 19 vaccine, testing required.   Electronically signed by Perrin Maltese, RN on 02/29/2020 at 1:55 PM

## 2020-03-02 ENCOUNTER — Emergency Department (HOSPITAL_COMMUNITY)
Admission: EM | Admit: 2020-03-02 | Discharge: 2020-03-02 | Disposition: A | Discharge status: Home / Self Care | Payer: Federal, State, Local not specified - PPO | Attending: Emergency Medicine | Admitting: Emergency Medicine

## 2020-03-02 ENCOUNTER — Emergency Department (HOSPITAL_COMMUNITY): Payer: Federal, State, Local not specified - PPO

## 2020-03-02 ENCOUNTER — Encounter (HOSPITAL_COMMUNITY): Payer: Self-pay

## 2020-03-02 DIAGNOSIS — R079 Chest pain, unspecified: Secondary | ICD-10-CM | POA: Diagnosis not present

## 2020-03-02 DIAGNOSIS — Z20822 Contact with and (suspected) exposure to covid-19: Secondary | ICD-10-CM | POA: Insufficient documentation

## 2020-03-02 DIAGNOSIS — R0789 Other chest pain: Secondary | ICD-10-CM | POA: Diagnosis not present

## 2020-03-02 DIAGNOSIS — E039 Hypothyroidism, unspecified: Secondary | ICD-10-CM | POA: Insufficient documentation

## 2020-03-02 DIAGNOSIS — E162 Hypoglycemia, unspecified: Secondary | ICD-10-CM | POA: Diagnosis not present

## 2020-03-02 DIAGNOSIS — M62838 Other muscle spasm: Secondary | ICD-10-CM | POA: Insufficient documentation

## 2020-03-02 DIAGNOSIS — Z79890 Hormone replacement therapy (postmenopausal): Secondary | ICD-10-CM | POA: Diagnosis not present

## 2020-03-02 DIAGNOSIS — Z79899 Other long term (current) drug therapy: Secondary | ICD-10-CM | POA: Insufficient documentation

## 2020-03-02 DIAGNOSIS — Z87891 Personal history of nicotine dependence: Secondary | ICD-10-CM | POA: Diagnosis not present

## 2020-03-02 DIAGNOSIS — E161 Other hypoglycemia: Secondary | ICD-10-CM | POA: Diagnosis not present

## 2020-03-02 LAB — BASIC METABOLIC PANEL
Anion gap: 13 (ref 5–15)
BUN: 14 mg/dL (ref 6–20)
CO2: 27 mmol/L (ref 22–32)
Calcium: 10.1 mg/dL (ref 8.9–10.3)
Chloride: 99 mmol/L (ref 98–111)
Creatinine, Ser: 0.93 mg/dL (ref 0.44–1.00)
GFR calc Af Amer: >60 mL/min (ref >60)
GFR calc non Af Amer: >60 mL/min (ref >60)
Glucose, Bld: 98 mg/dL (ref 70–99)
Potassium: 3.8 mmol/L (ref 3.5–5.1)
Sodium: 139 mmol/L (ref 135–145)

## 2020-03-02 LAB — CBC
HCT: 41.9 % (ref 36.0–46.0)
Hemoglobin: 13.7 g/dL (ref 12.0–15.0)
MCH: 28.8 pg (ref 26.0–34.0)
MCHC: 32.7 g/dL (ref 30.0–36.0)
MCV: 88 fL (ref 80.0–100.0)
Platelets: 259 10*3/uL (ref 150–400)
RBC: 4.76 MIL/uL (ref 3.87–5.11)
RDW: 12.5 % (ref 11.5–15.5)
WBC: 7.7 10*3/uL (ref 4.0–10.5)
nRBC: 0 % (ref 0.0–0.2)

## 2020-03-02 LAB — SARS CORONAVIRUS 2 BY RT PCR (HOSPITAL ORDER, PERFORMED IN ~~LOC~~ HOSPITAL LAB): SARS Coronavirus 2: NEGATIVE

## 2020-03-02 LAB — TROPONIN I (HIGH SENSITIVITY): Troponin I (High Sensitivity): <2 ng/L (ref <18)

## 2020-03-02 MED ORDER — ACETAMINOPHEN 325 MG PO TABS
650.0000 mg | ORAL_TABLET | Freq: Once | ORAL | Status: AC
  Administered 2020-03-02: 650 mg via ORAL
  Filled 2020-03-02: qty 2

## 2020-03-02 MED ORDER — CYCLOBENZAPRINE HCL 10 MG PO TABS: 10.0000 mg | ORAL_TABLET | Freq: Two times a day (BID) | ORAL | 0 refills | Status: DC | PRN

## 2020-03-02 MED ORDER — LIDOCAINE 5 % EX PTCH: 1.0000 | MEDICATED_PATCH | CUTANEOUS | 0 refills | Status: DC

## 2020-03-02 NOTE — Discharge Instructions (Addendum)
Take the medications as needed for pain.  You can also take Tylenol.  Follow-up with your doctor for further evaluation if the symptoms persist.  Return as needed to the ED for worsening symptoms, fevers, weakness

## 2020-03-02 NOTE — ED Provider Notes (Signed)
[facility] EMERGENCY DEPARTMENT Provider Note   CSN: [id number] Arrival date & time: 03/02/20  0200     History Chief Complaint  Patient presents with  . Neck Pain    Alyssa Holmes is a 54 y.o. female.  HPI  HPI: A 54 year old patient with a history of hypercholesterolemia presents for evaluation of chest pain. Initial onset of pain was more than 6 hours ago. The patient's chest pain is described as heaviness/pressure/tightness and is not worse with exertion. The patient complains of nausea and reports some diaphoresis. The patient's chest pain is middle- or left-sided, is not well-localized, is not sharp and does radiate to the arms/jaw/neck. The patient has no history of stroke, has no history of peripheral artery disease, has not smoked in the past 90 days, denies any history of treated diabetes, has no relevant family history of coronary artery disease (first degree relative at less than age 8), is not hypertensive and does not have an elevated BMI (>=30).  Pt states the pain was initially in her neck and jaw and then moved into her chest.  It was very intense.  It was so severe she had trouble getting out her words. Past Medical History:  Diagnosis Date  . Bipolar disorder ([place])   . Bundle branch block   . Elevated cholesterol    dx in early 54s  . Endometriosis   . Female bladder prolapse   . Fibroid   . Headache(784.0)   . Hypothyroidism   . Mitral valve prolapse    during adolescence  . Ovarian torsion   . Pancreatitis   . Shortness of breath     Patient Active Problem List   Diagnosis Date Noted  . Sinus tachycardia 02/02/2019  . Palpitations 10/11/2018  . Hypoxia 06/15/2014  . Bundle branch block   . Shortness of breath   . Hypothyroidism   . Bipolar disorder ([place])   . Fibroid   . Endometriosis   . Pancreatitis   . Ovarian torsion   . Female bladder prolapse   . Chest pain, unspecified 09/05/2013  . Elevated cholesterol   .  Mitral valve prolapse     Past Surgical History:  Procedure Laterality Date  . ABDOMINAL HYSTERECTOMY    . APPENDECTOMY    . CHOLECYSTECTOMY    . COLONOSCOPY    . DILATION AND CURETTAGE OF UTERUS    . ESOPHAGOGASTRODUODENOSCOPY (EGD) WITH PROPOFOL N/A 09/09/2013   Procedure: ESOPHAGOGASTRODUODENOSCOPY (EGD) WITH PROPOFOL;  Surgeon: Arta Silence, MD;  Location: [facility] ENDOSCOPY;  Service: Endoscopy;  Laterality: N/A;  . EUS N/A 09/09/2013   Procedure: ESOPHAGEAL ENDOSCOPIC ULTRASOUND (EUS) RADIAL;  Surgeon: Arta Silence, MD;  Location: [facility] ENDOSCOPY;  Service: Endoscopy;  Laterality: N/A;  . HERNIA REPAIR    . LAPAROSCOPY Left 11/15/2013   Procedure: LAPAROSCOPY OPERATIVE WITH LEFT SALPINGO-OOPHORECTOMY/LYSIS OF ADHESIONS/REMOVAL OF LEFT OVARIAN CYST;  Surgeon: Betsy Coder, MD;  Location: [facility];  Service: Gynecology;  Laterality: Left;  . OOPHORECTOMY       OB History    Gravida  2   Para  2   Term  2   Preterm      AB      Living  2     SAB      TAB      Ectopic      Multiple      Live Births              Family History  Problem Relation Age of Onset  . Thyroid disease Mother   . Heart disease Mother   . Diabetes Mother   . Asthma Father   . Stroke Father   . Cancer - Colon Other     Social History   Tobacco Use  . Smoking status: Former Smoker    Types: Cigarettes  . Smokeless tobacco: Never Used  Substance Use Topics  . Alcohol use: Yes    Comment: couple drinks a week  . Drug use: No    Home Medications Prior to Admission medications   Medication Sig Start Date End Date Taking? Authorizing Provider  acetaminophen (TYLENOL) 500 MG tablet Take 1,000 mg by mouth every 6 (six) hours as needed for mild pain.    [provider]  B Complex-C (B-COMPLEX WITH VITAMIN C) tablet Take 1 tablet by mouth daily.    [provider]  BIOTIN PO Take 1 tablet by mouth daily.    [provider]  Cholecalciferol (VITAMIN D3) 2000  UNITS TABS Take 2 tablets by mouth daily.     [provider]  cyclobenzaprine (FLEXERIL) 10 MG tablet Take 1 tablet (10 mg total) by mouth 2 (two) times daily as needed for muscle spasms. 03/02/20   Dorie Rank, MD  lamoTRIgine (LAMICTAL) 200 MG tablet Take 1 tablet by mouth every evening. 09/25/18   [provider]  levothyroxine (SYNTHROID) 75 MCG tablet Take 1 tablet by mouth daily. 10/01/18   [provider]  lidocaine (LIDODERM) 5 % Place 1 patch onto the skin daily. Remove & Discard patch within 12 hours or as directed by MD 03/02/20   Dorie Rank, MD  LORazepam (ATIVAN) 0.5 MG tablet Take 0.5 mg by mouth daily as needed for anxiety or sleep.     [provider]  MAGNESIUM PO Take 1 tablet by mouth daily.    [provider]  metoprolol succinate (TOPROL XL) 25 MG 24 hr tablet Take 1 tablet (25 mg total) by mouth daily. 05/10/19   Nahser, Wonda Cheng, MD  NIACIN PO Take 500 mg by mouth daily.     [provider]  venlafaxine XR (EFFEXOR-XR) 150 MG 24 hr capsule Take 1 capsule by mouth daily. 01/26/19   [provider]    Allergies    Vancomycin, Ibuprofen, Oxycodone-acetaminophen, Toradol [ketorolac tromethamine], and Penicillins  Review of Systems   Review of Systems  All other systems reviewed and are negative.   Physical Exam Updated Vital Signs BP 119/80   Pulse 73   Temp 98.2 F (36.8 C) (Oral)   Resp 18   SpO2 97%   Physical Exam Vitals and nursing note reviewed.  Constitutional:      General: She is not in acute distress.    Appearance: She is well-developed.  HENT:     Head: Normocephalic and atraumatic.     Right Ear: External ear normal.     Left Ear: External ear normal.  Eyes:     General: No scleral icterus.       Right eye: No discharge.        Left eye: No discharge.     Conjunctiva/sclera: Conjunctivae normal.  Neck:     Trachea: No tracheal deviation.     Comments: Paraspinal muscle  ttp Cardiovascular:     Rate and Rhythm: Normal rate and regular rhythm.     Comments: Mild chest wall ttp Pulmonary:     Effort: Pulmonary effort is normal. No respiratory distress.  Breath sounds: Normal breath sounds. No stridor. No wheezing or rales.  Abdominal:     General: Bowel sounds are normal. There is no distension.     Palpations: Abdomen is soft.     Tenderness: There is no abdominal tenderness. There is no guarding or rebound.  Musculoskeletal:     Cervical back: Neck supple. Tenderness present.  Skin:    General: Skin is warm and dry.     Findings: No rash.  Neurological:     Mental Status: She is alert.     Cranial Nerves: No cranial nerve deficit (no facial droop, extraocular movements intact, no slurred speech).     Sensory: No sensory deficit.     Motor: No abnormal muscle tone or seizure activity.     Coordination: Coordination normal.     ED Results / Procedures / Treatments   Labs (all labs ordered are listed, but only abnormal results are displayed) Labs Reviewed  SARS CORONAVIRUS 2 BY RT PCR (HOSPITAL ORDER, [facility])  CBC  BASIC METABOLIC PANEL  TROPONIN I (HIGH SENSITIVITY)    EKG EKG Interpretation  Date/Time:  Friday March 02 2020 14:39:57 EDT Ventricular Rate:  81 PR Interval:  190 QRS Duration: 144 QT Interval:  446 QTC Calculation: 518 R Axis:   106 Text Interpretation: Normal sinus rhythm Right bundle branch block Septal infarct , age undetermined T wave abnormality, consider lateral ischemia Abnormal ECG No significant change since last tracing Confirmed by Dorie Rank [phone]) on 03/02/2020 4:14:54 PM   Radiology DG Chest 2 View  Result Date: 03/02/2020 CLINICAL DATA:  Chest pain radiating to left side EXAM: CHEST - 2 VIEW COMPARISON:  Chest x-ray 09/27/2018 FINDINGS: The heart size and mediastinal contours are within normal limits. Persistent biapical trace pleural/pulmonary scarring. No focal  consolidation. No pulmonary edema. No pleural effusion. No pneumothorax. No acute osseous abnormality. Right upper quadrant surgical clips. IMPRESSION: No active cardiopulmonary disease. Electronically Signed   By: Iven Finn M.D.   On: 03/02/2020 15:07    Procedures Procedures (including critical care time)  Medications Ordered in ED Medications  acetaminophen (TYLENOL) tablet 650 mg (650 mg Oral Given 03/02/20 1703)    ED Course  I have reviewed the triage vital signs and the nursing notes.  Pertinent labs & imaging results that were available during my care of the patient were reviewed by me and considered in my medical decision making (see chart for details).  Clinical Course as of Mar 02 1746  Fri Mar 02, 2020  1746 Chest x-ray without acute findings.  Covid test is negative.  CBC metabolic panel troponin are normal   [JK]    Clinical Course User Index [JK] Dorie Rank, MD   MDM Rules/Calculators/A&P HEAR Score: 5                        Pt presented to the ED with neck pain that was very intense.  It radiated to her chest.  Sx have improved while she has been waiting.  Pt does have musculoskeletal ttp on exam.  No signs of infection to suggest meningitis.  No neuro deficits on exam to suggest aortic dissection, carotid dissection.  Doubt ACS with her normal troponin and >12 hours of symptoms.  Will dc home with muscle relaxant.  Continue tylenol.  Follow up with PCP Final Clinical Impression(s) / ED Diagnoses Final diagnoses:  Muscle spasms of neck  Chest pain, unspecified type  Rx / DC Orders ED Discharge Orders         Ordered    cyclobenzaprine (FLEXERIL) 10 MG tablet  2 times daily PRN        03/02/20 1744    lidocaine (LIDODERM) 5 %  Every 24 hours        03/02/20 1744           Dorie Rank, MD 03/02/20 1747

## 2020-03-02 NOTE — ED Triage Notes (Signed)
Emergency Medicine Provider Triage Evaluation Note  Alyssa Holmes , a 54 y.o. female  was evaluated in triage.  Pt complains of chest pain. I saw patient after she had been in the waiting room for 12 hours.  She reports 1 hour onset of chest pain, palpitations, shortness of breath.  She states pain is rating to her left side neck.  She has a history of A. fib and mitral valve prolapse.  No history of CAD.  Review of Systems  Positive: Chest pain, neck pain, shortness of breath Negative: Diaphoresis, nausea, vomiting  Physical Exam  BP 130/76 (BP Location: Right Arm)    Pulse 73    Temp 98.2 F (36.8 C) (Oral)    Resp 20    SpO2 100%  Gen:   Awake, no distress   HEENT:  Atraumatic Resp:  Normal effort, ctab  Cardiac:  Normal rate  Abd:   Nondistended, nontender  MSK:   Moves extremities without difficulty  Neuro:  Speech clear   Medical Decision Making  Medically screening exam initiated at 3:14 PM.  Appropriate orders placed.  Alyssa Holmes was informed that the remainder of the evaluation will be completed by another provider, this initial triage assessment does not replace that evaluation, and the importance of remaining in the ED until their evaluation is complete.  Clinical Impression   Patient developed chest pain while in the waiting room.  She has associated shortness breath, palpitations, and radiation to her left neck.  As such, will obtain troponin and EKG.   Franchot Heidelberg, PA-C 03/02/20 1521

## 2020-03-02 NOTE — ED Triage Notes (Signed)
Pt comes via [facility] for neck pain ans stiffness that has been going on for two days, headaches, denies fevers or injury

## 2020-03-07 DIAGNOSIS — Z23 Encounter for immunization: Secondary | ICD-10-CM | POA: Diagnosis not present

## 2020-03-07 DIAGNOSIS — E039 Hypothyroidism, unspecified: Secondary | ICD-10-CM | POA: Diagnosis not present

## 2020-03-07 DIAGNOSIS — F39 Unspecified mood [affective] disorder: Secondary | ICD-10-CM | POA: Diagnosis not present

## 2020-03-07 DIAGNOSIS — F419 Anxiety disorder, unspecified: Secondary | ICD-10-CM | POA: Diagnosis not present

## 2020-03-12 DIAGNOSIS — G4733 Obstructive sleep apnea (adult) (pediatric): Secondary | ICD-10-CM | POA: Diagnosis not present

## 2020-03-13 ENCOUNTER — Inpatient Hospital Stay: Payer: BLUE CROSS/BLUE SHIELD | Primary: Family Medicine

## 2020-03-13 DIAGNOSIS — Z01818 Encounter for other preprocedural examination: Secondary | ICD-10-CM

## 2020-03-14 LAB — COVID-19 AMBULATORY: SARS-CoV-2: NOT DETECTED

## 2020-03-15 NOTE — Progress Notes (Signed)
[facility]                                                                                                                    PRE OP INSTRUCTIONS FOR  Lauren Hull        Date: 03/15/2020    Date of surgery: 03/16/20   Arrival Time: Hospital will call you between 5pm and 7pm with your final arrival time for surgery    1. Do not eat or drink anything after midnight prior to surgery. This includes no water, chewing gum, mints or ice chips.    2. Take the following medications with a small sip of water on the morning of Surgery: zoloft, tylenol prn     3. Diabetics may take evening dose of insulin but none after midnight.  If you feel symptomatic or low blood sugar morning of surgery drink 1-2 ounces of apple juice only.    4. Aspirin, Ibuprofen, Advil, Naproxen, Vitamin E and other Anti-inflammatory products should be stopped  before surgery  as directed by your physician.  Take Tylenol only unless instructed otherwise by your surgeon.    5. Check with your Doctor regarding stopping Plavix, Coumadin, Lovenox, Eliquis, Effient, or other blood thinners.    6. Do not smoke,use illicit drugs and do not drink any alcoholic beverages 24 hours prior to surgery.    7. You may brush your teeth the morning of surgery.  DO NOT SWALLOW WATER    8. You MUST make arrangements for a responsible adult to take you home after your surgery. You will not be allowed to leave alone or drive yourself home.  It is strongly suggested someone stay with you the first 24 hrs. Your surgery will be cancelled if you do not have a ride home.    9. PEDIATRIC PATIENTS ONLY:  A parent/legal guardian must accompany a child scheduled for surgery and plan to stay at the hospital until the child is discharged.  Please do not bring other children with you.    10. Please wear simple, loose fitting clothing to the hospital.  Do not bring valuables (money, credit cards, checkbooks, etc.) Do not wear any makeup (including no eye  makeup) or nail polish on your fingers or toes.    11. DO NOT wear any jewelry or piercings on day of surgery.  All body piercing jewelry must be removed.    12. Shower the night before surgery with __x_Antibacterial soap /SAGE WIPES________    13. TOTAL JOINT REPLACEMENT/HYSTERECTOMY PATIENTS ONLY---Remember to bring Blood Bank bracelet to the hospital on the day of surgery.    14. If you have a Living Will and Durable Power of Attorney for Healthcare, please bring in a copy.    15. If appropriate bring crutches, inspirex, WALKER, CANE etc...    16. Notify your Surgeon if you develop any illness between now and surgery time, cough, cold, fever, sore throat, nausea, vomiting, etc.  Please notify your surgeon if you  experience dizziness, shortness of breath or blurred vision between now & the time of your surgery.    17. If you have ___dentures, they will be removed before going to the OR; we will provide you a container. If you wear ___contact lenses or ___glasses, they will be removed; please bring a case for them.    18. To provide excellent care visitors will be limited to1 in the room at any given time.    19. Please bring picture ID and insurance card.    20. Sleep apnea patients need to bring CPAP AND SETTINGS to hospital on day of surgery.                                                                                         21. During flu season no children under the age of 75 are permitted in the hospital for the safety of all patients.     22. Other please check in at the information desk/main lobby. Please wear a mask.                 Please call AMBULATORY CARE if you have any further questions.   Pre Admit Testing           [phone]     [facility] [phone]

## 2020-03-16 ENCOUNTER — Inpatient Hospital Stay: Payer: BLUE CROSS/BLUE SHIELD

## 2020-03-16 MED ORDER — LIDOCAINE HCL 2 % IJ SOLN
2 % | INTRAMUSCULAR | Status: DC | PRN
Start: 2020-03-16 — End: 2020-03-16
  Administered 2020-03-16: 12:00:00 50 via INTRAVENOUS

## 2020-03-16 MED ORDER — PROPOFOL 200 MG/20ML IV EMUL
20020 MG/20ML | INTRAVENOUS | Status: DC | PRN
Start: 2020-03-16 — End: 2020-03-16
  Administered 2020-03-16: 12:00:00 150 via INTRAVENOUS
  Administered 2020-03-16: 12:00:00 30 via INTRAVENOUS

## 2020-03-16 MED ORDER — PROPOFOL 200 MG/20ML IV EMUL
200 | INTRAVENOUS | Status: AC
Start: 2020-03-16 — End: 2020-03-16

## 2020-03-16 MED ORDER — LACTATED RINGERS IV SOLN
INTRAVENOUS | Status: DC
Start: 2020-03-16 — End: 2020-03-16
  Administered 2020-03-16: 11:00:00 via INTRAVENOUS

## 2020-03-16 MED FILL — DIPRIVAN 200 MG/20ML IV EMUL: 200 MG/20ML | INTRAVENOUS | Qty: 20

## 2020-03-16 NOTE — H&P (Signed)
General Surgery History and Physical  ??  Patient's Name/Date of Birth: Lauren Hull / 08/21/1965  ??  Date: 03/16/2020    ??  Surgeon: Haskell Flirt M.D.  ??  PCP: Domingo Pulse, DO     Chief Complaint: ruq pain   ??  HPI:   Lauren Hull is a 54 y.o. female who presents for evaluation of ruq pain that radiates to back intermittently. Timing is intermittent and usually postprandially, radiation to right side and back, alleviated by nothing and started weeks ago and severity is 2-9/10  ??  ??  Past Medical History        Past Medical History:   Diagnosis Date   ??? Anxiety ??   ??? Arthritis ??   ??? Bronchitis ??   ??? PONV (postoperative nausea and vomiting) ??   ?? with anesthesia      ??  ??  Past Surgical History         Past Surgical History:   Procedure Laterality Date   ??? ECTOPIC PREGNANCY SURGERY ?? 1986   ??? NERVE BLOCK Left 07/20/2015   ?? transforaminal nerve, lumbar #1   ??? NERVE BLOCK Left 08/17/2015   ??? NERVE BLOCK Left 09/06/2015   ?? lumbar tfnb #3   ??? NERVE BLOCK Left 11/01/2015   ?? medial branch block, lumbar #1   ??? NERVE BLOCK Right 11/15/2015   ?? MBB #2   ??? NERVE BLOCK Left 02/06/2016   ?? lumbar radiofrequency    ??? TUBAL LIGATION ?? ??   ??? VEIN SURGERY ?? ??      ??  ??  Current Facility-Administered Medications          Current Outpatient Medications   Medication Sig Dispense Refill   ??? buPROPion (WELLBUTRIN XL) 150 MG extended release tablet TK 1 T PO QD ?? 0   ??? acetaminophen (TYLENOL) 325 MG tablet Take 650 mg by mouth every 6 hours as needed for Pain ?? ??   ??? sertraline (ZOLOFT) 50 MG tablet take 1 tablet by mouth once daily ?? 0   ??  No current facility-administered medications for this visit.      ??  ??       Allergies   Allergen Reactions   ??? Ibuprofen Shortness Of Breath and Itching   ??  ??  The patient has a family history that is negative for severe cardiovascular or respiratory issues, negative for reaction to anesthesia.  ??  Social History      ??        Socioeconomic History   ??? Marital status: Single    ?? ?? Spouse name: Not on file   ??? Number of children: Not on file   ??? Years of education: Not on file   ??? Highest education level: Not on file   Occupational History   ??? Not on file   Tobacco Use   ??? Smoking status: Current Every Day Smoker   ?? ?? Packs/day: 0.50   ?? ?? Years: 30.00   ?? ?? Pack years: 15.00   ?? ?? Types: Cigarettes   ??? Smokeless tobacco: Never Used   Substance and Sexual Activity   ??? Alcohol use: No   ??? Drug use: No   ??? Sexual activity: Not on file   Other Topics Concern   ??? Not on file   Social History Narrative   ??? Not on file   ??  Social Determinants of Health   ??  Financial Resource Strain:    ??? Difficulty of Paying Living Expenses:    Food Insecurity:    ??? Worried About Programme researcher, broadcasting/film/video in the Last Year:    ??? Barista in the Last Year:    Transportation Needs:    ??? Freight forwarder (Medical):    ??? Lack of Transportation (Non-Medical):    Physical Activity:    ??? Days of Exercise per Week:    ??? Minutes of Exercise per Session:    Stress:    ??? Feeling of Stress :    Social Connections:    ??? Frequency of Communication with Friends and Family:    ??? Frequency of Social Gatherings with Friends and Family:    ??? Attends Religious Services:    ??? Database administrator or Organizations:    ??? Attends Engineer, structural:    ??? Marital Status:    Intimate Programme researcher, broadcasting/film/video Violence:    ??? Fear of Current or Ex-Partner:    ??? Emotionally Abused:    ??? Physically Abused:    ??? Sexually Abused:       ??  ??  ??  ??  Review of Systems  Review of Systems -  General ROS: negative for - chills, fatigue or malaise  ENT ROS: negative for - hearing change, nasal congestion or nasal discharge  Allergy and Immunology ROS: negative for - hives, itchy/watery eyes or nasal congestion  Hematological and Lymphatic ROS: negative for - blood clots, blood transfusions, bruising or fatigue  Endocrine ROS: negative for - malaise/lethargy, mood swings, palpitations or polydipsia/polyuria  Respiratory ROS: negative for -  sputum changes, stridor, tachypnea or wheezing  Cardiovascular ROS: negative for - irregular heartbeat, loss of consciousness, murmur or orthopnea  Gastrointestinal ROS: negative for - constipation, diarrhea, gas/bloating, heartburn or hematemesis  Genito-Urinary ROS: negative for -  genital discharge, genital ulcers or hematuria  Musculoskeletal ROS: negative for - gait disturbance, muscle pain or muscular weakness  ??  Physical exam:   BP 127/72 (Site: Left Upper Arm, Position: Sitting, Cuff Size: Medium Adult)    Pulse 70    Temp 97.8 ??F (36.6 ??C)    Ht 5\' 7"  (1.702 m)    Wt 204 lb (92.5 kg)    BMI 31.95 kg/m??   General appearance:  NAD  Head: NCAT, PERRLA, EOMI, red conjunctiva  Neck: supple, no masses  Lungs: CTAB, equal chest rise bilateral  Heart: Reg rate  Abdomen: soft, nontender, nondistended  Skin; no lesions  Gu: no cva tenderness  Extremities: extremities normal, atraumatic, no cyanosis or edema  ??  ??  Assessment:  54 y.o. female with ruq pain   ??  Plan:  Will get usg of gb and if this is normal will do EGD to assess.  57, MD

## 2020-03-16 NOTE — Anesthesia Pre-Procedure Evaluation (Signed)
Department of Anesthesiology  Preprocedure Note       Name:  Lauren Hull   Age:  54 y.o.  DOB:  [birth year]                                          MRN:  [id number]         Date:  03/16/2020      Surgeon: Juliann Mule):  Kristen Cardinal, MD    Procedure: Procedure(s):  EGD ESOPHAGOGASTRODUODENOSCOPY    Medications prior to admission:   Prior to Admission medications    Medication Sig Start Date End Date Taking? Authorizing Provider   Omeprazole (PRILOSEC PO) Take by mouth daily   Yes Historical Provider, MD   acetaminophen (TYLENOL) 325 MG tablet Take 650 mg by mouth every 6 hours as needed for Pain    Historical Provider, MD   sertraline (ZOLOFT) 50 MG tablet take 1 tablet by mouth once daily 03/06/15   Historical Provider, MD       Current medications:    Current Facility-Administered Medications   Medication Dose Route Frequency Provider Last Rate Last Admin   ??? lactated ringers infusion   IntraVENous Continuous Kristen Cardinal, MD           Allergies:    Allergies   Allergen Reactions   ??? Ibuprofen Shortness Of Breath and Itching       Problem List:    Patient Active Problem List   Diagnosis Code   ??? DDD (degenerative disc disease), lumbar M51.36   ??? Lumbar spinal stenosis M48.061   ??? Facet arthropathy, lumbar  M47.816   ??? 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 Lumbar radiculopathy M54.16   ??? Chronic pain G89.29   ??? Disc displacement, lumbar M51.26   ??? 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 Osteoarthritis M19.90       Past Medical History:        Diagnosis Date   ??? Anxiety    ??? Arthritis    ??? Bronchitis    ??? PONV (postoperative nausea and vomiting)     with anesthesia       Past Surgical History:        Procedure Laterality Date   ??? BACK SURGERY  2019   ??? ECTOPIC PREGNANCY SURGERY  1986   ??? JOINT REPLACEMENT Left 06/2019    hip   ??? NERVE BLOCK Left 07/20/2015    transforaminal nerve, lumbar #1   ??? NERVE BLOCK Left 08/17/2015   ??? NERVE BLOCK Left 09/06/2015     lumbar tfnb #3   ??? NERVE BLOCK Left 11/01/2015    medial branch block, lumbar #1   ??? NERVE BLOCK Right 11/15/2015    MBB #2   ??? NERVE BLOCK Left 02/06/2016    lumbar radiofrequency    ??? TUBAL LIGATION     ??? VEIN SURGERY         Social History:    Social History     Tobacco Use   ??? Smoking status: Current Every Day Smoker     Packs/day: 0.50     Years: 30.00     Pack years: 15.00     Types: Cigarettes   ??? Smokeless tobacco: Never Used   Substance Use Topics   ??? Alcohol use: No  Ready to quit: Not Answered  Counseling given: Not Answered      Vital Signs (Current):   Vitals:    03/16/20 0658   BP: (!) 111/57   Pulse: 70   Resp: 18   Temp: 98 ??F (36.7 ??C)   TempSrc: Temporal   SpO2: 98%   Weight: 209 lb 1.6 oz (94.8 kg)   Height: _0  (1.702 m)                                              BP Readings from Last 3 Encounters:   03/16/20 (!) 111/57   02/27/20 127/72   02/06/16 117/64       NPO Status: Time of last liquid consumption: 2030                        Time of last solid consumption: 2030                        Date of last liquid consumption: 03/15/20                        Date of last solid food consumption: 03/15/20    BMI:   Wt Readings from Last 3 Encounters:   03/16/20 209 lb 1.6 oz (94.8 kg)   02/27/20 204 lb (92.5 kg)   02/06/16 219 lb 12.8 oz (99.7 kg)     Body mass index is 32.75 kg/m??.    CBC:   Lab Results   Component Value Date    WBC 15.0 07/10/2018    RBC 3.09 07/10/2018    HGB 9.0 07/10/2018    HCT 29.6 07/10/2018    MCV 95.8 07/10/2018    RDW 14.3 07/10/2018    PLT 219 07/10/2018       CMP:   Lab Results   Component Value Date    NA 135 07/10/2018    K 4.4 07/10/2018    CL 101 07/10/2018    CO2 21 07/10/2018    BUN 13 07/10/2018    CREATININE 0.8 07/10/2018    GFRAA >60 07/10/2018    LABGLOM >60 07/10/2018    GLUCOSE 123 07/10/2018    CALCIUM 7.7 07/10/2018       POC Tests: No results for input(s): POCGLU, POCNA, POCK, POCCL, POCBUN, POCHEMO, POCHCT in the last 72  hours.    Coags: No results found for: PROTIME, INR, APTT    HCG (If Applicable): No results found for: PREGTESTUR, PREGSERUM, HCG, HCGQUANT     ABGs: No results found for: PHART, PO2ART, PCO2ART, HCO3ART, BEART, O2SATART     Type & Screen (If Applicable):  No results found for: LABABO, LABRH    Drug/Infectious Status (If Applicable):  No results found for: HIV, HEPCAB    COVID-19 Screening (If Applicable):   Lab Results   Component Value Date    COVID19 Not Detected 03/13/2020           Anesthesia Evaluation  Patient summary reviewed   history of anesthetic complications: PONV.  Airway: Mallampati: III  TM distance: >3 FB   Neck ROM: full  Mouth opening: > = 3 FB Dental:    (+) caps      Pulmonary: breath sounds clear to auscultation  (+) current smoker  Patient smoked on day of surgery.                 Cardiovascular:Negative CV ROS            Rhythm: regular             Beta Blocker:  Not on Beta Blocker         Neuro/Psych:   (+) neuromuscular disease:, depression/anxiety             GI/Hepatic/Renal:            ROS comment: Right upper quadrant pain.   Endo/Other: Negative Endo/Other ROS                    Abdominal:             Vascular: negative vascular ROS.         Other Findings:           Anesthesia Plan      MAC     ASA 2       Induction: intravenous.    MIPS: Prophylactic antiemetics administered.  Anesthetic plan and risks discussed with patient.      Plan discussed with CRNA.          Napoleon, DO   03/16/2020

## 2020-03-16 NOTE — Anesthesia Post-Procedure Evaluation (Signed)
Department of Anesthesiology  Postprocedure Note    Patient: Lauren Hull  MRN: [id number]  Birthdate: 09-26-1965  Date of evaluation: 03/16/2020  Time:  10:23 AM     Procedure Summary     Date: 03/16/20 Room / Location: [place] [facility]    Anesthesia Start: [phone] Anesthesia Stop: 0740    Procedure: EGD BIOPSY (N/A ) Diagnosis: (RUQ PAIN)    Surgeons: Kristen Cardinal, MD Responsible Provider: Alwyn Pea, DO    Anesthesia Type: MAC ASA Status: 2          Anesthesia Type: MAC    Aldrete Phase I: Aldrete Score: 10    Aldrete Phase II: Aldrete Score: 10    Last vitals: Reviewed and per EMR flowsheets.       Anesthesia Post Evaluation    Patient location during evaluation: PACU  Patient participation: complete - patient participated  Level of consciousness: awake and alert  Airway patency: patent  Nausea & Vomiting: no nausea and no vomiting  Complications: no  Cardiovascular status: hemodynamically stable  Respiratory status: acceptable  Hydration status: euvolemic

## 2020-03-16 NOTE — Discharge Instructions (Signed)
Upper GI Endoscopy: What to Expect at Home  Your Recovery  You had an upper GI endoscopy. Your doctor used a thin, lighted tube that bends to look at the inside of your esophagus, your stomach, and the first part of the small intestine, called the duodenum.  After you have an endoscopy, you will stay at the hospital or clinic for 1 to 2 hours. This will allow the medicine to wear off. You will be able to go home after your doctor or nurse checks to make sure that you're not having any problems.  You may have to stay overnight if you had treatment during the test. You may have a sore throat for a day or two after the test.  This care sheet gives you a general idea about what to expect after the test.  How can you care for yourself at home?  Activity    Rest as much as you need to after you go home.   You should be able to go back to your usual activities the day after the test.  Diet    Follow your doctor's directions for eating after the test.   Drink plenty of fluids (unless your doctor has told you not to).  Medications    If you have a sore throat the day after the test, use an over-the-counter spray to numb your throat.  Follow-up care is a key part of your treatment and safety. Be sure to make and go to all appointments, and call your doctor if you are having problems. It's also a good idea to know your test results and keep a list of the medicines you take.  When should you call for help?   Call 911 anytime you think you may need emergency care. For example, call if:    You passed out (lost consciousness).     You have trouble breathing.     You pass maroon or bloody stools.   Call your doctor now or seek immediate medical care if:    You have pain that does not get better after your take pain medicine.     You have new or worse belly pain.     You have blood in your stools.     You are sick to your stomach and cannot keep fluids down.     You have a fever.     You cannot pass  stools or gas.   Watch closely for changes in your health, and be sure to contact your doctor if:    Your throat still hurts after a day or two.     You do not get better as expected.   Where can you learn more?  Go to [email or web address] and sign in to your MyChart account. Enter J454 in the Search Health Information box to learn more about "Upper GI Endoscopy: What to Expect at Home."     If you do not have an account, please click on the "Sign Up Now" link.  Current as of: February 10, 2021Content Version: 13.0   2006-2021 [employer].   Care instructions adapted under license by [facility]. If you have questions about a medical condition or this instruction, always ask your healthcare professional. [employer], Inc[employer] disclaims any warranty or liability for your use of this information.

## 2020-03-16 NOTE — Progress Notes (Signed)
SBAR form completed and placed on chart. Chart with patient in transit.

## 2020-03-16 NOTE — Op Note (Signed)
SURGEON: Haskell Flirt  M.D.     PREOPERATIVE DIAGNOSES:  ruq pain    POSTOPERATIVE DIAGNOSES: 4cm hiatal hernia and esophagitis     OPERATION: Esophago-gastro-duodenoscopy with biopsy    BLOOD LOSS:    ANESTHESIA: LMAC    COMPLICATIONS: None.     OPERATIONS: The patient was placed on the table in left lateral decubitus position and sedated. Bite block was placed. A lubricated scope was easily passed into the upper esophagus which looked normal. The distal esophagus looked inflamed. The scope was passed into the stomach and retroflexed. There was 4cm hiatal hernia. The scope was passed down toward the pylorus. The antral mucosa all looked normal. Biopsy was taken to check for H. pylori. The scope was then passed through the pylorus into the duodenal bulb which looked normal, then around to the distal duodenum which looked normal, and the scope was then withdrawn. The GE junction was at 40 cm from the teeth. The patient tolerated the procedure well.     Physician Signature: Electronically signed by Dr. Daphine Deutscher

## 2020-03-20 ENCOUNTER — Emergency Department: Admit: 2020-03-21 | Payer: BLUE CROSS/BLUE SHIELD | Primary: Family Medicine

## 2020-03-20 DIAGNOSIS — R059 Cough, unspecified: Secondary | ICD-10-CM

## 2020-03-20 NOTE — ED Notes (Signed)
Blood sent to lab at this time     Langston Masker, LPN  82/50/03 7048

## 2020-03-20 NOTE — ED Provider Notes (Signed)
Patient was seen by [facility] and diagnostics were ordered, however, she was roomed in the hallway and left before being seen by myself.          Lauren Hull Vassie Loll, DO  03/21/20 0207

## 2020-03-21 ENCOUNTER — Inpatient Hospital Stay: Admit: 2020-03-21 | Discharge: 2020-03-21 | Payer: BLUE CROSS/BLUE SHIELD | Attending: Emergency Medicine

## 2020-03-21 LAB — COMPREHENSIVE METABOLIC PANEL W/ REFLEX TO MG FOR LOW K
ALT: 17 U/L (ref 0–32)
AST: 20 U/L (ref 0–31)
Albumin: 4.2 g/dL (ref 3.5–5.2)
Alkaline Phosphatase: 77 U/L (ref 35–104)
Anion Gap: 8 mmol/L (ref 7–16)
BUN: 12 mg/dL (ref 6–20)
CO2: 28 mmol/L (ref 22–29)
Calcium: 9.4 mg/dL (ref 8.6–10.2)
Chloride: 105 mmol/L (ref 98–107)
Creatinine: 1.2 mg/dL — ABNORMAL HIGH (ref 0.5–1.0)
GFR African American: 57
GFR Non-African American: 47 mL/min/{1.73_m2} (ref >=60)
Glucose: 95 mg/dL (ref 74–99)
Potassium reflex Magnesium: 4.1 mmol/L (ref 3.5–5.0)
Sodium: 141 mmol/L (ref 132–146)
Total Bilirubin: <0.2 mg/dL (ref 0.0–1.2)
Total Protein: 7.3 g/dL (ref 6.4–8.3)

## 2020-03-21 LAB — CBC WITH AUTO DIFFERENTIAL
Basophils %: 1 % (ref 0.0–2.0)
Basophils Absolute: 0.07 E9/L (ref 0.00–0.20)
Eosinophils %: 0.6 % (ref 0.0–6.0)
Eosinophils Absolute: 0.04 E9/L — ABNORMAL LOW (ref 0.05–0.50)
Hematocrit: 36 % (ref 34.0–48.0)
Hemoglobin: 11.3 g/dL — ABNORMAL LOW (ref 11.5–15.5)
Immature Granulocytes #: 0.03 E9/L
Immature Granulocytes %: 0.4 % (ref 0.0–5.0)
Lymphocytes %: 17.7 % — ABNORMAL LOW (ref 20.0–42.0)
Lymphocytes Absolute: 1.19 E9/L — ABNORMAL LOW (ref 1.50–4.00)
MCH: 28.9 pg (ref 26.0–35.0)
MCHC: 31.4 % — ABNORMAL LOW (ref 32.0–34.5)
MCV: 92.1 fL (ref 80.0–99.9)
MPV: 9.9 fL (ref 7.0–12.0)
Monocytes %: 11.3 % (ref 2.0–12.0)
Monocytes Absolute: 0.76 E9/L (ref 0.10–0.95)
Neutrophils %: 69 % (ref 43.0–80.0)
Neutrophils Absolute: 4.63 E9/L (ref 1.80–7.30)
Platelets: 293 E9/L (ref 130–450)
RBC: 3.91 E12/L (ref 3.50–5.50)
RDW: 14.3 fL (ref 11.5–15.0)
WBC: 6.7 E9/L (ref 4.5–11.5)

## 2020-03-21 LAB — SPECIMEN REJECTION

## 2020-03-21 LAB — COVID-19, RAPID: SARS-CoV-2, NAAT: NOT DETECTED

## 2020-03-21 LAB — D-DIMER, QUANTITATIVE: D-Dimer, Quant: <200 ng/mL DDU

## 2020-03-21 NOTE — ED Notes (Signed)
Patient reports being seen and dx bronchitis at urgent care. Urgent care recommended a chest XRAY. Patient here for Xray and states her pulse ox was low.       Wendie Chess, RN  03/21/20 [phone]

## 2020-03-21 NOTE — ED Notes (Signed)
Patient was witnessed standing at bedside she stated "your an asshole"     Wendie Chess, [place]  03/21/20 [phone]

## 2020-03-21 NOTE — ED Notes (Signed)
Pt. Apparently left without discharge papers.     Gwynneth Macleod, RN  03/21/20 [phone]

## 2020-03-30 ENCOUNTER — Ambulatory Visit: Payer: BLUE CROSS/BLUE SHIELD | Primary: Family Medicine

## 2020-04-02 ENCOUNTER — Encounter: Attending: Surgery | Primary: Family Medicine

## 2020-04-03 ENCOUNTER — Ambulatory Visit
Admission: RE | Admit: 2020-04-03 | Discharge: 2020-04-03 | Disposition: A | Discharge status: Home / Self Care | Payer: Federal, State, Local not specified - PPO | Source: Ambulatory Visit | Attending: Family Medicine | Admitting: Family Medicine

## 2020-04-03 ENCOUNTER — Other Ambulatory Visit: Payer: Self-pay

## 2020-04-03 DIAGNOSIS — Z1231 Encounter for screening mammogram for malignant neoplasm of breast: Secondary | ICD-10-CM

## 2020-04-06 ENCOUNTER — Other Ambulatory Visit: Payer: Self-pay | Admitting: Family Medicine

## 2020-04-06 DIAGNOSIS — R928 Other abnormal and inconclusive findings on diagnostic imaging of breast: Secondary | ICD-10-CM

## 2020-04-23 ENCOUNTER — Ambulatory Visit: Payer: Federal, State, Local not specified - PPO

## 2020-04-23 ENCOUNTER — Other Ambulatory Visit: Payer: Self-pay

## 2020-04-23 ENCOUNTER — Ambulatory Visit
Admission: RE | Admit: 2020-04-23 | Discharge: 2020-04-23 | Disposition: A | Discharge status: Home / Self Care | Payer: Federal, State, Local not specified - PPO | Source: Ambulatory Visit | Attending: Family Medicine | Admitting: Family Medicine

## 2020-04-23 DIAGNOSIS — R928 Other abnormal and inconclusive findings on diagnostic imaging of breast: Secondary | ICD-10-CM

## 2020-05-24 ENCOUNTER — Other Ambulatory Visit: Payer: Self-pay | Admitting: Cardiovascular Disease

## 2020-05-28 DIAGNOSIS — M9903 Segmental and somatic dysfunction of lumbar region: Secondary | ICD-10-CM | POA: Diagnosis not present

## 2020-05-28 DIAGNOSIS — M9901 Segmental and somatic dysfunction of cervical region: Secondary | ICD-10-CM | POA: Diagnosis not present

## 2020-05-28 DIAGNOSIS — M531 Cervicobrachial syndrome: Secondary | ICD-10-CM | POA: Diagnosis not present

## 2020-05-28 DIAGNOSIS — M9902 Segmental and somatic dysfunction of thoracic region: Secondary | ICD-10-CM | POA: Diagnosis not present

## 2020-06-19 ENCOUNTER — Other Ambulatory Visit: Payer: Self-pay | Admitting: Cardiovascular Disease

## 2020-06-22 DIAGNOSIS — L570 Actinic keratosis: Secondary | ICD-10-CM | POA: Diagnosis not present

## 2020-06-22 DIAGNOSIS — L218 Other seborrheic dermatitis: Secondary | ICD-10-CM | POA: Diagnosis not present

## 2020-06-22 DIAGNOSIS — D225 Melanocytic nevi of trunk: Secondary | ICD-10-CM | POA: Diagnosis not present

## 2020-06-22 DIAGNOSIS — L821 Other seborrheic keratosis: Secondary | ICD-10-CM | POA: Diagnosis not present

## 2020-06-22 DIAGNOSIS — L814 Other melanin hyperpigmentation: Secondary | ICD-10-CM | POA: Diagnosis not present

## 2020-06-22 DIAGNOSIS — L820 Inflamed seborrheic keratosis: Secondary | ICD-10-CM | POA: Diagnosis not present

## 2020-06-29 ENCOUNTER — Telehealth: Payer: Self-pay | Admitting: Cardiovascular Disease

## 2020-06-29 MED ORDER — METOPROLOL SUCCINATE ER 25 MG PO TB24
25.0000 mg | ORAL_TABLET | Freq: Every day | ORAL | 0 refills | Status: DC
Start: 2020-06-29 — End: 2020-07-02

## 2020-06-29 NOTE — Telephone Encounter (Signed)
*  STAT* If patient is at the pharmacy, call can be transferred to refill team.   1. Which medications need to be refilled? (please list name of each medication and dose if known) metoprolol succinate (TOPROL-XL) 25 MG 24 hr tablet  2. Which pharmacy/location (including street and city if local pharmacy) is medication to be sent to? [id number] - [place], [place] - [address]  3. Do they need a 30 day or 90 day supply? 30 day supply

## 2020-07-01 ENCOUNTER — Other Ambulatory Visit: Payer: Self-pay | Admitting: Cardiovascular Disease

## 2020-07-04 DIAGNOSIS — M9903 Segmental and somatic dysfunction of lumbar region: Secondary | ICD-10-CM | POA: Diagnosis not present

## 2020-07-04 DIAGNOSIS — M9901 Segmental and somatic dysfunction of cervical region: Secondary | ICD-10-CM | POA: Diagnosis not present

## 2020-07-04 DIAGNOSIS — M9902 Segmental and somatic dysfunction of thoracic region: Secondary | ICD-10-CM | POA: Diagnosis not present

## 2020-07-04 DIAGNOSIS — M531 Cervicobrachial syndrome: Secondary | ICD-10-CM | POA: Diagnosis not present

## 2020-07-15 ENCOUNTER — Encounter: Payer: Self-pay | Admitting: Cardiovascular Disease

## 2020-07-15 NOTE — Progress Notes (Signed)
Cardiology Office Note:    Date:  07/16/2020   ID:  Alyssa Holmes, DOB [birth year], MRN [id number]  PCP:  Aurea Graff, PA-C (Inactive)  Cardiologist:  Reneta Niehaus  Electrophysiologist:  None   Referring MD: No ref. provider found   Chief Complaint  Patient presents with  . Palpitations    Aug. 19, 2020     Alyssa Holmes is a 55 y.o. female with a hx of MVP and palpitations She was seen via telemedicine in April, 2020 She has a hx of MVP.   Started having palpitations several months ago.  Not related to exercise Clinically the palpitations sound c/w PVCs. Advised increasing her intake of potassium ( V8)  Advised regular exercise  Still has occasional palps  Is not exercising ,    Has occasional hot flashes Is on synthroid - TSH 3 months ago was normal .   Alyssa Holmes is seen today for follow up of her MVP and episodes o fpalpitations    May 10, 2019:  Has mvp with palpitations  we gave her metoprolol 25 mg bid  She is taking only once a day  - she cut the dose due to fatigue and constipation .  She has started exercising   Jan. 31, 2022:  Alyssa Holmes is seen today for follow  Up of her MVP and palpitations She was last seen in Nov. 2020 No CP , no palpitations Palpitations are better on the toprol 25 mg a day  Trying to exercise   Past Medical History:  Diagnosis Date  . Bipolar disorder ([place])   . Bundle branch block   . Elevated cholesterol    dx in early 78s  . Endometriosis   . Female bladder prolapse   . Fibroid   . Headache(784.0)   . Hypothyroidism   . Mitral valve prolapse    during adolescence  . Ovarian torsion   . Pancreatitis   . Shortness of breath     Past Surgical History:  Procedure Laterality Date  . ABDOMINAL HYSTERECTOMY    . APPENDECTOMY    . CHOLECYSTECTOMY    . COLONOSCOPY    . DILATION AND CURETTAGE OF UTERUS    . ESOPHAGOGASTRODUODENOSCOPY (EGD) WITH PROPOFOL N/A 09/09/2013   Procedure:  ESOPHAGOGASTRODUODENOSCOPY (EGD) WITH PROPOFOL;  Surgeon: Arta Silence, MD;  Location: [facility] ENDOSCOPY;  Service: Endoscopy;  Laterality: N/A;  . EUS N/A 09/09/2013   Procedure: ESOPHAGEAL ENDOSCOPIC ULTRASOUND (EUS) RADIAL;  Surgeon: Arta Silence, MD;  Location[facility] ENDOSCOPY;  Service: Endoscopy;  Laterality: N/A;  . HERNIA REPAIR    . LAPAROSCOPY Left 11/15/2013   Procedure: LAPAROSCOPY OPERATIVE WITH LEFT SALPINGO-OOPHORECTOMY/LYSIS OF ADHESIONS/REMOVAL OF LEFT OVARIAN CYST;  Surgeon: Betsy Coder, MD;  Location: [facility];  Service: Gynecology;  Laterality: Left;  . OOPHORECTOMY      Current Medications: Current Meds  Medication Sig  . acetaminophen (TYLENOL) 500 MG tablet Take 1,000 mg by mouth every 6 (six) hours as needed for mild pain.  . B Complex-C (B-COMPLEX WITH VITAMIN C) tablet Take 1 tablet by mouth daily.  . Biotin 5000 MCG CAPS Take 1 capsule by mouth daily.  Marland Kitchen BIOTIN PO Take 1 tablet by mouth daily.  . Cholecalciferol (VITAMIN D3) 125 MCG (5000 UT) CAPS Take 2 capsules by mouth daily.  . Cholecalciferol (VITAMIN D3) 2000 UNITS TABS Take 2 tablets by mouth daily.   . cyclobenzaprine (FLEXERIL) 10 MG tablet Take 1 tablet (10 mg total) by mouth 2 (two) times daily as  needed for muscle spasms.  . hydrocortisone 2.5 % cream Apply 1 application topically daily.  Marland Kitchen lamoTRIgine (LAMICTAL) 200 MG tablet Take 1 tablet by mouth every evening.  Marland Kitchen levothyroxine (SYNTHROID) 88 MCG tablet Take 88 mcg by mouth daily.  Marland Kitchen lidocaine (LIDODERM) 5 % Place 1 patch onto the skin daily. Remove & Discard patch within 12 hours or as directed by MD  . loratadine (CLARITIN) 10 MG tablet Take 1 tablet by mouth as needed.  Marland Kitchen LORazepam (ATIVAN) 0.5 MG tablet Take 0.5 mg by mouth daily as needed for anxiety or sleep.   Marland Kitchen MAGNESIUM PO Take 1 tablet by mouth daily.  . metroNIDAZOLE (METROCREAM) 0.75 % cream Apply 1 application topically as needed.  Marland Kitchen NIACIN PO Take 500 mg by mouth daily.   Marland Kitchen venlafaxine XR  (EFFEXOR-XR) 150 MG 24 hr capsule Take 1 capsule by mouth daily.  . [DISCONTINUED] levothyroxine (SYNTHROID) 75 MCG tablet Take 1 tablet by mouth daily.  . [DISCONTINUED] metoprolol succinate (TOPROL-XL) 25 MG 24 hr tablet TAKE 1 TAB BY MOUTH DAILY. MAKE OVERDUE APPT WITH DR.Nas Wafer BEFORE ANYMORE REFILLS. 2ND ATTEMPT     Allergies:   Vancomycin, Ibuprofen, Oxycodone-acetaminophen, Toradol [ketorolac tromethamine], and Penicillins   Social History   Socioeconomic History  . Marital status: Married    Spouse name: Not on file  . Number of children: Not on file  . Years of education: Not on file  . Highest education level: Not on file  Occupational History  . Not on file  Tobacco Use  . Smoking status: Former Smoker    Types: Cigarettes  . Smokeless tobacco: Never Used  Substance and Sexual Activity  . Alcohol use: Yes    Comment: couple drinks a week  . Drug use: No  . Sexual activity: Yes    Birth control/protection: Post-menopausal  Other Topics Concern  . Not on file  Social History Narrative  . Not on file   Social Determinants of Health   Financial Resource Strain: Not on file  Food Insecurity: Not on file  Transportation Needs: Not on file  Physical Activity: Not on file  Stress: Not on file  Social Connections: Not on file     Family History: The patient's family history includes Asthma in her father; Cancer - Colon in an other family member; Diabetes in her mother; Heart disease in her mother; Stroke in her father; Thyroid disease in her mother.  ROS:   Please see the history of present illness.     All other systems reviewed and are negative.  EKGs/Labs/Other Studies Reviewed:    The following studies were reviewed today:   EKG:     Recent Labs: 03/02/2020: BUN 14; Creatinine, Ser 0.93; Hemoglobin 13.7; Platelets 259; Potassium 3.8; Sodium 139  Recent Lipid Panel    Component Value Date/Time   CHOL 218 (H) 09/06/2013 1318   TRIG 403 (H) 09/09/2013  1312   HDL 30 (L) 09/06/2013 1318   CHOLHDL 7.3 09/06/2013 1318   VLDL UNABLE TO CALCULATE IF TRIGLYCERIDE OVER 400 mg/dL 09/06/2013 1318   LDLCALC UNABLE TO CALCULATE IF TRIGLYCERIDE OVER 400 mg/dL 09/06/2013 1318    Physical Exam:    Physical Exam: Blood pressure 102/70, pulse 78, height 5' (1.524 m), weight 159 lb 6.4 oz (72.3 kg), SpO2 97 %.  GEN:  Well nourished, well developed in no acute distress HEENT: Normal NECK: No JVD; No carotid bruits LYMPHATICS: No lymphadenopathy CARDIAC: RRR , no murmurs, rubs, gallops RESPIRATORY:  Clear  to auscultation without rales, wheezing or rhonchi  ABDOMEN: Soft, non-tender, non-distended MUSCULOSKELETAL:  No edema; No deformity  SKIN: Warm and dry NEUROLOGIC:  Alert and oriented x 3   ASSESSMENT:    No diagnosis found. PLAN:    In order of problems listed above:  Tachycardia:    Better on the Toprol .  She would like to continue to be seen yearly   We will see her again in 1 year.  Medication Adjustments/Labs and Tests Ordered: Current medicines are reviewed at length with the patient today.  Concerns regarding medicines are outlined above.  No orders of the defined types were placed in this encounter.  Meds ordered this encounter  Medications  . metoprolol succinate (TOPROL-XL) 25 MG 24 hr tablet    Sig: Take 1 tablet (25 mg total) by mouth once for 1 dose.    Dispense:  90 tablet    Refill:  3     Patient Instructions  Medication Instructions:  Your physician recommends that you continue on your current medications as directed. Please refer to the Current Medication list given to you today.  *If you need a refill on your cardiac medications before your next appointment, please call your pharmacy*   Lab Work: None today If you have labs (blood work) drawn today and your tests are completely normal, you will receive your results only by: Marland Kitchen MyChart Message (if you have MyChart) OR . A paper copy in the mail If you  have any lab test that is abnormal or we need to change your treatment, we will call you to review the results.   Testing/Procedures: None ordered   Follow-Up: At [facility], you and your health needs are our priority.  As part of our continuing mission to provide you with exceptional heart care, we have created designated Provider Care Teams.  These Care Teams include your primary Cardiologist (physician) and Advanced Practice Providers (APPs -  Physician Assistants and Nurse Practitioners) who all work together to provide you with the care you need, when you need it.    Your next appointment:   1 year(s)  The format for your next appointment:   In Person  Provider:   You may see Mertie Moores, MD or one of the following Advanced Practice Providers on your designated Care Team:    Richardson Dopp, PA-C  Robbie Lis, [place]       Signed, Mertie Moores, MD  07/16/2020 4:53 PM    [place]

## 2020-07-16 ENCOUNTER — Other Ambulatory Visit: Payer: Self-pay

## 2020-07-16 ENCOUNTER — Encounter: Payer: Self-pay | Admitting: Cardiovascular Disease

## 2020-07-16 ENCOUNTER — Ambulatory Visit: Payer: Federal, State, Local not specified - PPO | Admitting: Cardiovascular Disease

## 2020-07-16 VITALS — BP 102/70 | HR 78 | Ht 60.0 in | Wt 159.4 lb

## 2020-07-16 DIAGNOSIS — R002 Palpitations: Secondary | ICD-10-CM

## 2020-07-16 MED ORDER — METOPROLOL SUCCINATE ER 25 MG PO TB24: 25.0000 mg | ORAL_TABLET | Freq: Once | ORAL | 3 refills | Status: DC

## 2020-07-16 NOTE — Patient Instructions (Signed)
Medication Instructions:  Your physician recommends that you continue on your current medications as directed. Please refer to the Current Medication list given to you today.  *If you need a refill on your cardiac medications before your next appointment, please call your pharmacy*   Lab Work: None today If you have labs (blood work) drawn today and your tests are completely normal, you will receive your results only by: Marland Kitchen MyChart Message (if you have MyChart) OR . A paper copy in the mail If you have any lab test that is abnormal or we need to change your treatment, we will call you to review the results.   Testing/Procedures: None ordered   Follow-Up: At [facility], you and your health needs are our priority.  As part of our continuing mission to provide you with exceptional heart care, we have created designated Provider Care Teams.  These Care Teams include your primary Cardiologist (physician) and Advanced Practice Providers (APPs -  Physician Assistants and Nurse Practitioners) who all work together to provide you with the care you need, when you need it.    Your next appointment:   1 year(s)  The format for your next appointment:   In Person  Provider:   You may see Mertie Moores, MD or one of the following Advanced Practice Providers on your designated Care Team:    Richardson Dopp, PA-C  [place], [place]

## 2020-07-31 ENCOUNTER — Emergency Department: Admit: 2020-08-01 | Payer: BLUE CROSS/BLUE SHIELD | Primary: Family Medicine

## 2020-07-31 DIAGNOSIS — R002 Palpitations: Secondary | ICD-10-CM

## 2020-07-31 NOTE — ED Notes (Signed)
Department of Emergency Medicine  FIRST PROVIDER TRIAGE NOTE             Independent MLP           07/31/20  8:00 PM EST    Date of Encounter: 07/31/20   MRN: [id number]      HPI: Lauren Hull is a 55 y.o. female who presents to the ED for Leg Pain (pt reports swelling and pain to right shin area. pt denies pain to calf.) and Palpitations  Patient presents to the emergency department with several day history of left shin pain. Patient also was reporting having episodes of chest pain as well as help rotations. Patient reports she still is having the palpitations, more noticeably at night, she does report having COVID-19 2 months ago. No recent long travel. Symptoms moderate in severity    ROS: Negative for diarrhea or urinary complaints.    PE: Gen Appearance/Constitutional: alert  HEENT: NC/NT. PERRLA,  Airway patent.     Initial Plan of Care: All treatment areas with department are currently occupied. Plan to order/Initiate the following while awaiting opening in ED: labs, EKG and imaging studies.  Initiate Treatment-Testing, Proceed toTreatment Area When Bed Available for ED Attending/MLP to Continue Care    Electronically signed by Ellery Plunk, APRN - CNP   DD: 07/31/20         Ellery Plunk, APRN - CNP  07/31/20 2001  ATTENDING PROVIDER ATTESTATION:     Supervising Physician, on-site, available for consultation, non-participatory in the evaluation or care of this patient       Dwain Sarna, MD  07/31/20 2220

## 2020-07-31 NOTE — ED Provider Notes (Signed)
HPI:  07/31/20, Time: 9:49 PM EST         Lauren Hull is a 55 y.o. female presenting to the ED for leg swelling/pain, beginning days ago.  The complaint has been persistent, moderate in severity, and worsened by nothing. Patient reporting that she has been having pain in her left lower extremity she states mildly swollen. Patient reports no fall or injury. She reports history of varicose veins. Patient reporting no fever chills or cough she reports feeling palpitations for last several weeks she states about 2 weeks ago she was having chest discomfort for the whole day. She has not had pain since then. Patient reporting no abdominal pain or vomiting there is no diarrhea. She reports no weakness or dizziness. She has had palpitations for the last several weeks though.    ROS:   Pertinent positives and negatives are stated within HPI, all other systems reviewed and are negative.  --------------------------------------------- PAST HISTORY ---------------------------------------------  Past Medical History:  has a past medical history of Anxiety, Arthritis, Bronchitis, and PONV (postoperative nausea and vomiting).    Past Surgical History:  has a past surgical history that includes Vein Surgery; Tubal ligation; Ectopic pregnancy surgery (1986); Nerve Block (Left, 07/20/2015); Nerve Block (Left, 08/17/2015); Nerve Block (Left, 09/06/2015); Nerve Block (Left, 11/01/2015); Nerve Block (Right, 11/15/2015); Nerve Block (Left, 02/06/2016); back surgery (2019); joint replacement (Left, 06/2019); and Upper gastrointestinal endoscopy (N/A, 03/16/2020).    Social History:  reports that she has been smoking cigarettes. She has a 15.00 pack-year smoking history. She has never used smokeless tobacco. She reports that she does not drink alcohol and does not use drugs.    Family History: family history is not on file.     The patient???s home medications have been reviewed.    Allergies:  Ibuprofen    ---------------------------------------------------PHYSICAL EXAM--------------------------------------    Constitutional/General: Alert and oriented x3, well appearing, non toxic in NAD  Head: Normocephalic and atraumatic  Eyes: PERRL, EOMI  Mouth: Oropharynx clear, handling secretions, no trismus  Neck: Supple, full ROM, non tender to palpation in the midline, no stridor, no crepitus, no meningeal signs  Pulmonary: Lungs clear to auscultation bilaterally, no wheezes, rales, or rhonchi. Not in respiratory distress  Cardiovascular:  Regular rate. Regular rhythm. No murmurs, gallops, or rubs. 2+ distal pulses  Chest: no chest wall tenderness  Abdomen: Soft.  Non tender. Non distended.  +BS.  No rebound, guarding, or rigidity. No pulsatile masses appreciated.  Musculoskeletal: Moves all extremities x 4 tenderness to left anterior shin noted varicosities pulses are intact. Warm and well perfused, no clubbing, cyanosis, or edema. Capillary refill <3 seconds  Skin: warm and dry. No rashes.   Neurologic: GCS 15, CN 2-12 grossly intact, no focal deficits, symmetric strength 5/5 in the upper and lower extremities bilaterally  Psych: Normal Affect    -------------------------------------------------- RESULTS -------------------------------------------------  I have personally reviewed all laboratory and imaging results for this patient. Results are listed below.     LABS:  Results for orders placed or performed during the hospital encounter of 07/31/20   Troponin   Result Value Ref Range    Troponin, High Sensitivity 7 0 - 9 ng/L   CBC Auto Differential   Result Value Ref Range    WBC 11.7 (H) 4.5 - 11.5 E9/L    RBC 3.92 3.50 - 5.50 E12/L    Hemoglobin 11.3 (L) 11.5 - 15.5 g/dL    Hematocrit 16.1 09.6 - 48.0 %    MCV  92.3 80.0 - 99.9 fL    MCH 28.8 26.0 - 35.0 pg    MCHC 31.2 (L) 32.0 - 34.5 %    RDW 14.0 11.5 - 15.0 fL    Platelets 313 130 - 450 E9/L    MPV 9.8 7.0 - 12.0 fL    Neutrophils % 63.5 43.0 - 80.0 %     Immature Granulocytes % 0.7 0.0 - 5.0 %    Lymphocytes % 26.6 20.0 - 42.0 %    Monocytes % 7.3 2.0 - 12.0 %    Eosinophils % 1.2 0.0 - 6.0 %    Basophils % 0.7 0.0 - 2.0 %    Neutrophils Absolute 7.44 (H) 1.80 - 7.30 E9/L    Immature Granulocytes # 0.08 E9/L    Lymphocytes Absolute 3.11 1.50 - 4.00 E9/L    Monocytes Absolute 0.86 0.10 - 0.95 E9/L    Eosinophils Absolute 0.14 0.05 - 0.50 E9/L    Basophils Absolute 0.08 0.00 - 0.20 E9/L   Comprehensive Metabolic Panel   Result Value Ref Range    Sodium 140 132 - 146 mmol/L    Potassium 3.9 3.5 - 5.0 mmol/L    Chloride 104 98 - 107 mmol/L    CO2 25 22 - 29 mmol/L    Anion Gap 11 7 - 16 mmol/L    Glucose 94 74 - 99 mg/dL    BUN 19 6 - 20 mg/dL    CREATININE 1.1 (H) 0.5 - 1.0 mg/dL    GFR Non-African American 52 >=60 mL/min/1.73    GFR African American >60     Calcium 9.4 8.6 - 10.2 mg/dL    Total Protein 7.2 6.4 - 8.3 g/dL    Albumin 4.1 3.5 - 5.2 g/dL    Total Bilirubin <8.5 0.0 - 1.2 mg/dL    Alkaline Phosphatase 72 35 - 104 U/L    ALT 15 0 - 32 U/L    AST 15 0 - 31 U/L   D-Dimer, Quantitative   Result Value Ref Range    D-Dimer, Quant 240 ng/mL DDU   Magnesium   Result Value Ref Range    Magnesium 2.1 1.6 - 2.6 mg/dL   POC Pregnancy Urine   Result Value Ref Range    HCG, Urine, POC Negative Negative    Lot Number [id number]     Positive QC Pass/Fail Acceptable     Negative QC Pass/Fail Acceptable        RADIOLOGY:  Interpreted by Radiologist.  CTA CHEST W CONTRAST   Final Result   No evidence of pulmonary embolism or acute pulmonary abnormality.         [place] DUP LOWER EXTREMITY LEFT VEN   Final Result   Within the visualized vessels there is no evidence for deep venous   thrombosis               XR CHEST PORTABLE   Final Result   No evidence of active cardiopulmonary pathology.               EKG:  This EKG is signed and interpreted by me.    Rate: 74  Rhythm: Sinus  Interpretation: no acute changes  Comparison: no previous EKG  available        ------------------------- NURSING NOTES AND VITALS REVIEWED ---------------------------   The nursing notes within the ED encounter and vital signs as below have been reviewed by myself.  BP 136/68    Pulse 69  Temp 97.5 ??F (36.4 ??C)    Resp 18    SpO2 97%   Oxygen Saturation Interpretation: Normal    The patient???s available past medical records and past encounters were reviewed.        ------------------------------ ED COURSE/MEDICAL DECISION MAKING----------------------  Medications   sodium chloride flush 0.9 % injection 10 mL (has no administration in time range)   iopamidol (ISOVUE-370) 76 % injection 75 mL (75 mLs IntraVENous Given 08/01/20 0055)             Medical Decision Making:   Patient reporting to me leg pain that she is had for the last several days some swelling.  She reports no trauma or injury.  Patient does have tenderness to the leg pulses are intact distally.  Patient reporting some intermittent shortness of breath and palpitations as well.  Patient reports that about 2 weeks ago she was removing snow was having discomfort in her chest at that time she states she was having the whole day.  She has had no chest pain since then.  Patient here is only complaining of leg pain as well as intermittent palpitations.  Patient reports no history of diabetes or hypertension.  Patient labs are reviewed as well as EKG D-dimer was slightly elevated to 40 CT of the chest was done due to her complaint of shortness of breath and palpitations CT shows no signs of PE.  Patient made aware of findings and plan.  Patient will be discharged home she is to follow-up with her PCP she has appointment on Saturday     Re-Evaluations:             Re-evaluation.  Patient???s symptoms show no change  Patient reevaluated and made aware of findings vital signs stable.  Patient having no chest pain no difficulty breathing at present time reporting some left leg pain.  Patient neurologically intact.  Patient made  aware of her CT findings as well as ultrasound report.  Patient to follow-up with her PCP she does have appointment on Saturday she is to return anytime for any further problems.    Consultations:                 Critical Care:         This patient's ED course included: a personal history and physicial eaxmination    This patient has been closely monitored during their ED course.    Counseling:   The emergency provider has spoken with the patient and discussed today???s results, in addition to providing specific details for the plan of care and counseling regarding the diagnosis and prognosis.  Questions are answered at this time and they are agreeable with the plan.       --------------------------------- IMPRESSION AND DISPOSITION ---------------------------------    IMPRESSION  1. Palpitations    2. Left leg pain        DISPOSITION  Disposition: Discharge home  Patient condition is stable        NOTE: This report was transcribed using voice recognition software. Every effort was made to ensure accuracy; however, inadvertent computerized transcription errors may be present          Dwain Sarna, MD  08/01/20 [phone]

## 2020-08-01 ENCOUNTER — Emergency Department: Admit: 2020-08-01 | Payer: BLUE CROSS/BLUE SHIELD | Primary: Family Medicine

## 2020-08-01 ENCOUNTER — Inpatient Hospital Stay
Admit: 2020-08-01 | Discharge: 2020-08-01 | Disposition: A | Discharge status: Home / Self Care | Payer: BLUE CROSS/BLUE SHIELD | Attending: Emergency Medicine

## 2020-08-01 LAB — CBC WITH AUTO DIFFERENTIAL
Basophils %: 0.7 % (ref 0.0–2.0)
Basophils Absolute: 0.08 E9/L (ref 0.00–0.20)
Eosinophils %: 1.2 % (ref 0.0–6.0)
Eosinophils Absolute: 0.14 E9/L (ref 0.05–0.50)
Hematocrit: 36.2 % (ref 34.0–48.0)
Hemoglobin: 11.3 g/dL — ABNORMAL LOW (ref 11.5–15.5)
Immature Granulocytes #: 0.08 E9/L
Immature Granulocytes %: 0.7 % (ref 0.0–5.0)
Lymphocytes %: 26.6 % (ref 20.0–42.0)
Lymphocytes Absolute: 3.11 E9/L (ref 1.50–4.00)
MCH: 28.8 pg (ref 26.0–35.0)
MCHC: 31.2 % — ABNORMAL LOW (ref 32.0–34.5)
MCV: 92.3 fL (ref 80.0–99.9)
MPV: 9.8 fL (ref 7.0–12.0)
Monocytes %: 7.3 % (ref 2.0–12.0)
Monocytes Absolute: 0.86 E9/L (ref 0.10–0.95)
Neutrophils %: 63.5 % (ref 43.0–80.0)
Neutrophils Absolute: 7.44 E9/L — ABNORMAL HIGH (ref 1.80–7.30)
Platelets: 313 E9/L (ref 130–450)
RBC: 3.92 E12/L (ref 3.50–5.50)
RDW: 14 fL (ref 11.5–15.0)
WBC: 11.7 E9/L — ABNORMAL HIGH (ref 4.5–11.5)

## 2020-08-01 LAB — EKG 12-LEAD
Atrial Rate: 74 {beats}/min
P Axis: 55 degrees
P-R Interval: 136 ms
Q-T Interval: 372 ms
QRS Duration: 88 ms
QTc Calculation (Bazett): 412 ms
R Axis: 68 degrees
T Axis: 60 degrees
Ventricular Rate: 74 {beats}/min

## 2020-08-01 LAB — MAGNESIUM: Magnesium: 2.1 mg/dL (ref 1.6–2.6)

## 2020-08-01 LAB — COMPREHENSIVE METABOLIC PANEL
ALT: 15 U/L (ref 0–32)
AST: 15 U/L (ref 0–31)
Albumin: 4.1 g/dL (ref 3.5–5.2)
Alkaline Phosphatase: 72 U/L (ref 35–104)
Anion Gap: 11 mmol/L (ref 7–16)
BUN: 19 mg/dL (ref 6–20)
CO2: 25 mmol/L (ref 22–29)
Calcium: 9.4 mg/dL (ref 8.6–10.2)
Chloride: 104 mmol/L (ref 98–107)
Creatinine: 1.1 mg/dL — ABNORMAL HIGH (ref 0.5–1.0)
GFR African American: >60
GFR Non-African American: 52 mL/min/{1.73_m2} (ref >=60)
Glucose: 94 mg/dL (ref 74–99)
Potassium: 3.9 mmol/L (ref 3.5–5.0)
Sodium: 140 mmol/L (ref 132–146)
Total Bilirubin: <0.2 mg/dL (ref 0.0–1.2)
Total Protein: 7.2 g/dL (ref 6.4–8.3)

## 2020-08-01 LAB — POC PREGNANCY UR-QUAL: HCG, Urine, POC: NEGATIVE

## 2020-08-01 LAB — TROPONIN: Troponin, High Sensitivity: 7 ng/L (ref 0–9)

## 2020-08-01 LAB — D-DIMER, QUANTITATIVE: D-Dimer, Quant: 240 ng/mL DDU

## 2020-08-01 MED ORDER — NORMAL SALINE FLUSH 0.9 % IV SOLN
0.9 % | INTRAVENOUS | Status: DC | PRN
Start: 2020-08-01 — End: 2020-08-01

## 2020-08-01 MED ORDER — IOPAMIDOL 76 % IV SOLN
76 % | Freq: Once | INTRAVENOUS | Status: AC | PRN
Start: 2020-08-01 — End: 2020-08-01
  Administered 2020-08-01: 06:00:00 via INTRAVENOUS

## 2020-08-01 NOTE — ED Notes (Signed)
Pt requesting to leave AMA. MD Coralyn Mark made aware. Pt educated on necessity of CTA exam and risks of leaving. Pt agreeable to have scan completed. Pt ad lib to CT in stable condition.      Gwenlyn Found, RN  08/01/20 [phone]

## 2020-08-21 DIAGNOSIS — M9901 Segmental and somatic dysfunction of cervical region: Secondary | ICD-10-CM | POA: Diagnosis not present

## 2020-08-21 DIAGNOSIS — M9902 Segmental and somatic dysfunction of thoracic region: Secondary | ICD-10-CM | POA: Diagnosis not present

## 2020-08-21 DIAGNOSIS — M531 Cervicobrachial syndrome: Secondary | ICD-10-CM | POA: Diagnosis not present

## 2020-08-21 DIAGNOSIS — M9903 Segmental and somatic dysfunction of lumbar region: Secondary | ICD-10-CM | POA: Diagnosis not present

## 2020-08-21 NOTE — Telephone Encounter (Signed)
Called to confirm appointment for 08/22/20 at 815 a.m, left message with date, time, address and phone number for patient.

## 2020-08-22 ENCOUNTER — Encounter

## 2020-08-22 ENCOUNTER — Ambulatory Visit
Admit: 2020-08-22 | Discharge: 2020-08-22 | Payer: BLUE CROSS/BLUE SHIELD | Attending: Vascular Surgery | Primary: Family Medicine

## 2020-08-22 DIAGNOSIS — I83813 Varicose veins of bilateral lower extremities with pain: Secondary | ICD-10-CM

## 2020-08-22 MED ORDER — JOBST 20-30MMHG COMPRESSION SM MISC
3 refills | Status: AC
Start: 2020-08-22 — End: 2023-08-05

## 2020-08-22 NOTE — Progress Notes (Signed)
Vascular Surgery Outpatient Consultation        Reason for Consult:    Chief Complaint   Patient presents with   ??? Consultation     new pt. bilateral leg pain       Requesting Physician:  No referring provider defined for this encounter.    Chief Complaint   Patient presents with   ??? Consultation     new pt. bilateral leg pain       HISTORY OF PRESENT ILLNESS:                The patient is a 55 y.o. female who is referred for evaluation of left lower extremity leg discomfort.  She describes some discomfort and discoloration of her left lower extremity.  She went to the emergency room where an ultrasound was done demonstrating no evidence of DVT.  Since that time this issue is resolved.  She was initially taking aspirin however stopped taking aspirin secondary to easy bruising.  She denies any history of blood clots or bleeding disorders.  Her mom does have a history of blood clot and her father has a history of atrial fibrillation.  But otherwise she denies any specific issues.  She denies any other issues associated with this problem.  She has a history of lower extremity what sounds like vein stripping and phlebectomy done in 2003.  Since that time she has developed additional varicose veins consistent with the anterior lateral branches.  She describes some achiness to her lower extremities worse at the end of the day.  Up until this point she has been wearing knee-high compression stockings..    Past Medical History:        Diagnosis Date   ??? Anxiety    ??? Arthritis    ??? Bronchitis    ??? Leg pain    ??? PONV (postoperative nausea and vomiting)     with anesthesia     Past Surgical History:        Procedure Laterality Date   ??? BACK SURGERY  2019   ??? ECTOPIC PREGNANCY SURGERY  1986   ??? JOINT REPLACEMENT Left 06/2019    hip   ??? NERVE BLOCK Left 07/20/2015    transforaminal nerve, lumbar #1   ??? NERVE BLOCK Left 08/17/2015   ??? NERVE BLOCK Left 09/06/2015    lumbar tfnb #3   ??? NERVE BLOCK Left 11/01/2015    medial branch  block, lumbar #1   ??? NERVE BLOCK Right 11/15/2015    MBB #2   ??? NERVE BLOCK Left 02/06/2016    lumbar radiofrequency    ??? TUBAL LIGATION     ??? UPPER GASTROINTESTINAL ENDOSCOPY N/A 03/16/2020    EGD BIOPSY performed by Loreli Slot, MD at [facility] ENDOSCOPY   ??? VEIN SURGERY       Current Medications:   Prior to Admission medications    Medication Sig Start Date End Date Taking? Authorizing Provider   Multiple Vitamins-Minerals (THERAPEUTIC MULTIVITAMIN-MINERALS) tablet Take 1 tablet by mouth daily   Yes Historical Provider, MD   Omeprazole (PRILOSEC PO) Take by mouth daily   Yes Historical Provider, MD   acetaminophen (TYLENOL) 325 MG tablet Take 650 mg by mouth every 6 hours as needed for Pain   Yes Historical Provider, MD   sertraline (ZOLOFT) 50 MG tablet take 1 tablet by mouth once daily 03/06/15  Yes Historical Provider, MD   Elastic Bandages & Supports (JOBST KNEE HIGH COMPRESSION SM) MISC Knee  high compression stockings, 20-30 mm/Hg 08/22/20   Jinny Blossom, MD     Allergies:  Ibuprofen    Social History     Socioeconomic History   ??? Marital status: Single     Spouse name: Not on file   ??? Number of children: Not on file   ??? Years of education: Not on file   ??? Highest education level: Not on file   Occupational History   ??? Not on file   Tobacco Use   ??? Smoking status: Current Every Day Smoker     Packs/day: 0.50     Years: 30.00     Pack years: 15.00     Types: Cigarettes   ??? Smokeless tobacco: Never Used   Substance and Sexual Activity   ??? Alcohol use: No   ??? Drug use: No   ??? Sexual activity: Not on file   Other Topics Concern   ??? Not on file   Social History Narrative   ??? Not on file     Social Determinants of Health     Financial Resource Strain:    ??? Difficulty of Paying Living Expenses: Not on file   Food Insecurity:    ??? Worried About Running Out of Food in the Last Year: Not on file   ??? Ran Out of Food in the Last Year: Not on file   Transportation Needs:    ??? Lack of Transportation (Medical): Not on file    ??? Lack of Transportation (Non-Medical): Not on file   Physical Activity:    ??? Days of Exercise per Week: Not on file   ??? Minutes of Exercise per Session: Not on file   Stress:    ??? Feeling of Stress : Not on file   Social Connections:    ??? Frequency of Communication with Friends and Family: Not on file   ??? Frequency of Social Gatherings with Friends and Family: Not on file   ??? Attends Religious Services: Not on file   ??? Active Member of Clubs or Organizations: Not on file   ??? Attends Banker Meetings: Not on file   ??? Marital Status: Not on file   Intimate Partner Violence:    ??? Fear of Current or Ex-Partner: Not on file   ??? Emotionally Abused: Not on file   ??? Physically Abused: Not on file   ??? Sexually Abused: Not on file   Housing Stability:    ??? Unable to Pay for Housing in the Last Year: Not on file   ??? Number of Places Lived in the Last Year: Not on file   ??? Unstable Housing in the Last Year: Not on file        History reviewed. No pertinent family history.    REVIEW OF SYSTEMS (New symptoms):    Eyes:      Blurred vision:  No [x] []                Diplopia:   No [x] Valentino Hue []                Vision loss:       No [x] /Yes []    Ears, nose, throat:             Hearing loss:    No [x] []       Vertigo:   No [x] []   Swallowing problem:  No [x] Valentino Hue/Yes []                Nose bleeds:   No [x] Valentino Hue/Yes []       Voice hoarseness:  No [x] Valentino Hue/Yes []   Respiratory:             Cough:   No [x] Valentino Hue/Yes []       Pleuritic chest pain:  No [x] Valentino Hue/Yes []                         Dyspnea:   No [x] Valentino Hue/Yes []       Wheezing:   No [x] Valentino Hue/Yes []   Cardiovascular:             Angina:   No [x] /Yes []       Palpitations:   No [x] Valentino Hue/Yes []           Claudication:    No [x] Valentino Hue/Yes []       Leg swelling:   No [x] Valentino Hue/Yes []   Gastrointestinal:             Nausea or vomiting:  No [x] Valentino Hue/Yes []                Abdominal pain:  No [x] Valentino Hue/Yes []                      Intestinal bleeding: No [x] Valentino Hue/Yes []   Musculoskeletal:             Leg pain:   No [x] Valentino Hue/Yes  []       Back pain:   No [x] Valentino Hue/Yes []                     Weakness:   No [x] Valentino Hue/Yes []   Neurologic:             Numbness:   No [x] Valentino Hue/Yes []       Paralysis:   No [x] Valentino Hue/Yes []                        Headaches:   No [x] Valentino Hue/Yes []   Hematologic, lymphatic:   Anemia:   No [x] Valentino Hue/Yes []               Bleeding or bruising:  No [x] Valentino Hue/Yes []               Fevers or chills: No [x] Valentino Hue/Yes []   Endocrine:             Temp intolerance:   No [x] Valentino Hue/Yes []                        Polydipsia, polyuria:  No [x] Valentino Hue/Yes []   Skin:              Rash:    No [x] Valentino Hue/Yes []       Ulcers:   No [x] Valentino Hue/Yes []               Abnorm pigment: No [x] Valentino Hue/Yes []   GU:              Frequency/urgency:  No [x] /Yes []       Hematuria:    No [x] /Yes []                       Incontinence:    No [x] /Yes []   See HPI  PHYSICAL EXAM:  Vitals:    08/22/20 0811   BP: 132/84     General Appearance: alert and oriented to person, place and time, well developed and well- nourished, in  no acute distress  Skin: warm and dry, no rash or erythema multiple varicose veins consistent with a patent anterior lateral branch.  Left is greater than right there is no palpable cords.  The legs are not swollen there is no edema.  Head: normocephalic and atraumatic  Eyes: extraocular eye movements intact, conjunctivae normal  ENT: external ear and ear canal normal bilaterally, nose without deformity  Pulmonary/Chest: clear to auscultation bilaterally- no wheezes, rales or rhonchi, normal air movement, no respiratory distress  Cardiovascular: normal rate, regular rhythm, normal S1 and S2, no murmurs, no carotid bruits  Abdomen: soft, non-tender, non-distended, normal bowel sounds, no masses or organomegaly  Musculoskeletal: normal range of motion, no joint swelling, deformity or tenderness  Neurologic: no cranial nerve deficit, gait, coordination and speech normal  Extremities: Lateral varicose veins consistent with the anterior lateral branch.  Left greater than right.  No venous stasis disease.  Motor and sensation are intact  palpable pulses are noted distally.  There is no evidence of any palpable cords.    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  This is been resolved.  This most likely represented an episode of thrombophlebitis.  Right now that area is soft without evidence of thrombus.  She has a history of what sounds like venous stripping combined with phlebectomy.  Will order bilateral standing venous duplex examinations and I think she would benefit from stab phlebectomy.  The left would be done prior to the right.  I have also ordered thigh-high compression stockings.  She will call for the results of the testing.      No follow-ups on file.

## 2020-08-30 DIAGNOSIS — E039 Hypothyroidism, unspecified: Secondary | ICD-10-CM | POA: Diagnosis not present

## 2020-08-30 DIAGNOSIS — E041 Nontoxic single thyroid nodule: Secondary | ICD-10-CM | POA: Diagnosis not present

## 2020-09-05 DIAGNOSIS — F515 Nightmare disorder: Secondary | ICD-10-CM | POA: Diagnosis not present

## 2020-09-05 DIAGNOSIS — G4733 Obstructive sleep apnea (adult) (pediatric): Secondary | ICD-10-CM | POA: Diagnosis not present

## 2020-09-06 NOTE — Telephone Encounter (Signed)
Confirmed ultrasound appt for 09-07-2020 with Dr Alene Mires.

## 2020-09-07 ENCOUNTER — Inpatient Hospital Stay: Admit: 2020-09-07 | Discharge: 2020-09-07 | Payer: BLUE CROSS/BLUE SHIELD | Primary: Family Medicine

## 2020-09-07 DIAGNOSIS — M7989 Other specified soft tissue disorders: Secondary | ICD-10-CM

## 2020-09-07 DIAGNOSIS — R300 Dysuria: Secondary | ICD-10-CM | POA: Diagnosis not present

## 2020-09-07 NOTE — Progress Notes (Signed)
[facility] - [facility]    This is a pre read worksheet - prior to official physician interpretation    Lauren Hull  12-19-1965  Date of study: 09/07/20    Indication for study:  Leg pain and swelling, varicose veins  Study : Bilateral Lower Extremity Venous Duplex and Reflux Examination    Duplex examination of the common, deep, superficial femoral, and the popliteal veins of the RIGHT lower extremity identifies spontaneous flow.  All scanned veins are compressible and free of echogenic densities.  The right gsv is only visualized to the mid- thigh level. There was no evidence of reflux in the right greater saphenous vein.  The right greater saphenous vein measured 0.8 x 0.8 cm.    Duplex examination of the common, deep, superficial femoral, and the popliteal veins of the LEFT lower extremity identifies spontaneous flow.  All scanned veins are compressible and free of echogenic densities.  The left greater saphenous vein is not visualized past the 1st branch.  The 1st left greater saphenous vein branch measured 1.1 x 1.1 cm and there was evidence of reflux lasting 2254 ms.      Additional comments: Negative DVT.  There was no evidence of reflux in the right lesser saphenous vein.  The right lesser saphenous vein measured 0.5 x 0.5 cm.  There was no evidence of reflux in the left lesser saphenous vein.  The left lesser saphenous vein measured 0.5 x 0.5 cm.

## 2020-09-10 DIAGNOSIS — K760 Fatty (change of) liver, not elsewhere classified: Secondary | ICD-10-CM | POA: Diagnosis not present

## 2020-09-10 DIAGNOSIS — Z0000 Encounter for general adult medical examination without abnormal findings: Secondary | ICD-10-CM | POA: Diagnosis not present

## 2020-09-10 DIAGNOSIS — E039 Hypothyroidism, unspecified: Secondary | ICD-10-CM | POA: Diagnosis not present

## 2020-09-10 DIAGNOSIS — F319 Bipolar disorder, unspecified: Secondary | ICD-10-CM | POA: Diagnosis not present

## 2020-09-10 DIAGNOSIS — E782 Mixed hyperlipidemia: Secondary | ICD-10-CM | POA: Diagnosis not present

## 2020-09-10 DIAGNOSIS — E785 Hyperlipidemia, unspecified: Secondary | ICD-10-CM | POA: Diagnosis not present

## 2020-09-13 DIAGNOSIS — G4733 Obstructive sleep apnea (adult) (pediatric): Secondary | ICD-10-CM | POA: Diagnosis not present

## 2020-09-18 DIAGNOSIS — M531 Cervicobrachial syndrome: Secondary | ICD-10-CM | POA: Diagnosis not present

## 2020-09-18 DIAGNOSIS — M9903 Segmental and somatic dysfunction of lumbar region: Secondary | ICD-10-CM | POA: Diagnosis not present

## 2020-09-18 DIAGNOSIS — M9901 Segmental and somatic dysfunction of cervical region: Secondary | ICD-10-CM | POA: Diagnosis not present

## 2020-09-18 DIAGNOSIS — M9902 Segmental and somatic dysfunction of thoracic region: Secondary | ICD-10-CM | POA: Diagnosis not present

## 2020-09-27 NOTE — Telephone Encounter (Addendum)
Pt called for results of venous u/s      Please tell her she has some mild reflux disease.  Continue to wear compression stockings we will see her in the next month or 2.  No evidence of DVT

## 2020-10-03 NOTE — Telephone Encounter (Signed)
Patient called for test results, please call her.

## 2020-10-15 DIAGNOSIS — M6281 Muscle weakness (generalized): Secondary | ICD-10-CM | POA: Diagnosis not present

## 2020-10-15 DIAGNOSIS — N3943 Post-void dribbling: Secondary | ICD-10-CM | POA: Diagnosis not present

## 2020-10-15 DIAGNOSIS — M62838 Other muscle spasm: Secondary | ICD-10-CM | POA: Diagnosis not present

## 2020-10-15 DIAGNOSIS — R32 Unspecified urinary incontinence: Secondary | ICD-10-CM | POA: Diagnosis not present

## 2020-10-16 DIAGNOSIS — M9903 Segmental and somatic dysfunction of lumbar region: Secondary | ICD-10-CM | POA: Diagnosis not present

## 2020-10-16 DIAGNOSIS — M9902 Segmental and somatic dysfunction of thoracic region: Secondary | ICD-10-CM | POA: Diagnosis not present

## 2020-10-16 DIAGNOSIS — M9901 Segmental and somatic dysfunction of cervical region: Secondary | ICD-10-CM | POA: Diagnosis not present

## 2020-10-16 DIAGNOSIS — M531 Cervicobrachial syndrome: Secondary | ICD-10-CM | POA: Diagnosis not present

## 2020-10-22 DIAGNOSIS — M6281 Muscle weakness (generalized): Secondary | ICD-10-CM | POA: Diagnosis not present

## 2020-10-22 DIAGNOSIS — M62838 Other muscle spasm: Secondary | ICD-10-CM | POA: Diagnosis not present

## 2020-10-22 DIAGNOSIS — N3943 Post-void dribbling: Secondary | ICD-10-CM | POA: Diagnosis not present

## 2020-10-22 DIAGNOSIS — R32 Unspecified urinary incontinence: Secondary | ICD-10-CM | POA: Diagnosis not present

## 2020-10-29 DIAGNOSIS — M62838 Other muscle spasm: Secondary | ICD-10-CM | POA: Diagnosis not present

## 2020-10-29 DIAGNOSIS — R32 Unspecified urinary incontinence: Secondary | ICD-10-CM | POA: Diagnosis not present

## 2020-10-29 DIAGNOSIS — N3943 Post-void dribbling: Secondary | ICD-10-CM | POA: Diagnosis not present

## 2020-10-29 DIAGNOSIS — M6281 Muscle weakness (generalized): Secondary | ICD-10-CM | POA: Diagnosis not present

## 2020-11-05 DIAGNOSIS — R32 Unspecified urinary incontinence: Secondary | ICD-10-CM | POA: Diagnosis not present

## 2020-11-05 DIAGNOSIS — N3943 Post-void dribbling: Secondary | ICD-10-CM | POA: Diagnosis not present

## 2020-11-05 DIAGNOSIS — M62838 Other muscle spasm: Secondary | ICD-10-CM | POA: Diagnosis not present

## 2020-11-05 DIAGNOSIS — M6281 Muscle weakness (generalized): Secondary | ICD-10-CM | POA: Diagnosis not present

## 2020-11-19 DIAGNOSIS — M531 Cervicobrachial syndrome: Secondary | ICD-10-CM | POA: Diagnosis not present

## 2020-11-19 DIAGNOSIS — M9903 Segmental and somatic dysfunction of lumbar region: Secondary | ICD-10-CM | POA: Diagnosis not present

## 2020-11-19 DIAGNOSIS — M9901 Segmental and somatic dysfunction of cervical region: Secondary | ICD-10-CM | POA: Diagnosis not present

## 2020-11-19 DIAGNOSIS — M9902 Segmental and somatic dysfunction of thoracic region: Secondary | ICD-10-CM | POA: Diagnosis not present

## 2020-11-21 ENCOUNTER — Other Ambulatory Visit: Payer: Self-pay | Admitting: Physician Assistant

## 2020-11-21 DIAGNOSIS — K5900 Constipation, unspecified: Secondary | ICD-10-CM | POA: Diagnosis not present

## 2020-11-21 DIAGNOSIS — R140 Abdominal distension (gaseous): Secondary | ICD-10-CM | POA: Diagnosis not present

## 2020-11-21 DIAGNOSIS — R1013 Epigastric pain: Secondary | ICD-10-CM | POA: Diagnosis not present

## 2020-11-21 DIAGNOSIS — K760 Fatty (change of) liver, not elsewhere classified: Secondary | ICD-10-CM | POA: Diagnosis not present

## 2020-11-22 DIAGNOSIS — M62838 Other muscle spasm: Secondary | ICD-10-CM | POA: Diagnosis not present

## 2020-11-22 DIAGNOSIS — M6281 Muscle weakness (generalized): Secondary | ICD-10-CM | POA: Diagnosis not present

## 2020-11-22 DIAGNOSIS — R32 Unspecified urinary incontinence: Secondary | ICD-10-CM | POA: Diagnosis not present

## 2020-11-22 DIAGNOSIS — N3943 Post-void dribbling: Secondary | ICD-10-CM | POA: Diagnosis not present

## 2020-11-28 ENCOUNTER — Telehealth: Payer: Self-pay

## 2020-11-28 NOTE — Telephone Encounter (Signed)
Pt is requesting a change in her medication metoprolol. Pt states that Dr. Acie Fredrickson told her that she could take an extra tablet of the metoprolol and pt has been doing so and ran out of medication. The directions on this medication does not say for pt to take an extra table. Does Dr. Acie Fredrickson want the pt to continue taking this medication BID? Please address

## 2020-11-30 MED ORDER — METOPROLOL SUCCINATE ER 25 MG PO TB24: 25.0000 mg | ORAL_TABLET | Freq: Two times a day (BID) | ORAL | 3 refills | Status: DC

## 2020-11-30 NOTE — Telephone Encounter (Signed)
RN returned call to patient who states she has been taking the Toprol xl 25mg  BID. Patient states she is feeling much better on that dose and it is helping her HR calm down at night. Per Dr. Acie Fredrickson, okay to fill Toprol XL 25mg  BID. Patient verbalized understanding.

## 2020-12-05 ENCOUNTER — Ambulatory Visit
Admission: RE | Admit: 2020-12-05 | Discharge: 2020-12-05 | Disposition: A | Discharge status: Home / Self Care | Payer: Federal, State, Local not specified - PPO | Source: Ambulatory Visit | Attending: Physician Assistant | Admitting: Physician Assistant

## 2020-12-05 ENCOUNTER — Other Ambulatory Visit: Payer: Self-pay | Admitting: Physician Assistant

## 2020-12-05 DIAGNOSIS — R102 Pelvic and perineal pain: Secondary | ICD-10-CM | POA: Diagnosis not present

## 2020-12-05 DIAGNOSIS — R1013 Epigastric pain: Secondary | ICD-10-CM

## 2020-12-10 DIAGNOSIS — M531 Cervicobrachial syndrome: Secondary | ICD-10-CM | POA: Diagnosis not present

## 2020-12-10 DIAGNOSIS — M9902 Segmental and somatic dysfunction of thoracic region: Secondary | ICD-10-CM | POA: Diagnosis not present

## 2020-12-10 DIAGNOSIS — M9901 Segmental and somatic dysfunction of cervical region: Secondary | ICD-10-CM | POA: Diagnosis not present

## 2020-12-10 DIAGNOSIS — M9903 Segmental and somatic dysfunction of lumbar region: Secondary | ICD-10-CM | POA: Diagnosis not present

## 2020-12-11 ENCOUNTER — Other Ambulatory Visit: Payer: Self-pay

## 2020-12-11 ENCOUNTER — Encounter (HOSPITAL_COMMUNITY): Payer: Self-pay | Admitting: Emergency Medicine

## 2020-12-11 ENCOUNTER — Emergency Department (HOSPITAL_COMMUNITY): Payer: Federal, State, Local not specified - PPO

## 2020-12-11 ENCOUNTER — Emergency Department (HOSPITAL_COMMUNITY)
Admission: EM | Admit: 2020-12-11 | Discharge: 2020-12-11 | Disposition: A | Discharge status: Home / Self Care | Payer: Federal, State, Local not specified - PPO | Attending: Emergency Medicine | Admitting: Emergency Medicine

## 2020-12-11 DIAGNOSIS — Z87891 Personal history of nicotine dependence: Secondary | ICD-10-CM | POA: Diagnosis not present

## 2020-12-11 DIAGNOSIS — R42 Dizziness and giddiness: Secondary | ICD-10-CM | POA: Insufficient documentation

## 2020-12-11 DIAGNOSIS — R0602 Shortness of breath: Secondary | ICD-10-CM | POA: Insufficient documentation

## 2020-12-11 DIAGNOSIS — R0789 Other chest pain: Secondary | ICD-10-CM | POA: Diagnosis not present

## 2020-12-11 DIAGNOSIS — R110 Nausea: Secondary | ICD-10-CM | POA: Diagnosis not present

## 2020-12-11 DIAGNOSIS — E039 Hypothyroidism, unspecified: Secondary | ICD-10-CM | POA: Insufficient documentation

## 2020-12-11 DIAGNOSIS — R079 Chest pain, unspecified: Secondary | ICD-10-CM | POA: Diagnosis not present

## 2020-12-11 DIAGNOSIS — R55 Syncope and collapse: Secondary | ICD-10-CM | POA: Diagnosis not present

## 2020-12-11 LAB — CBC
HCT: 41.1 % (ref 36.0–46.0)
Hemoglobin: 13.8 g/dL (ref 12.0–15.0)
MCH: 30.2 pg (ref 26.0–34.0)
MCHC: 33.6 g/dL (ref 30.0–36.0)
MCV: 89.9 fL (ref 80.0–100.0)
Platelets: 302 10*3/uL (ref 150–400)
RBC: 4.57 MIL/uL (ref 3.87–5.11)
RDW: 12.4 % (ref 11.5–15.5)
WBC: 6.6 10*3/uL (ref 4.0–10.5)
nRBC: 0 % (ref 0.0–0.2)

## 2020-12-11 LAB — BASIC METABOLIC PANEL
Anion gap: 8 (ref 5–15)
BUN: 12 mg/dL (ref 6–20)
CO2: 27 mmol/L (ref 22–32)
Calcium: 9.5 mg/dL (ref 8.9–10.3)
Chloride: 103 mmol/L (ref 98–111)
Creatinine, Ser: 0.79 mg/dL (ref 0.44–1.00)
GFR, Estimated: >60 mL/min (ref >60)
Glucose, Bld: 97 mg/dL (ref 70–99)
Potassium: 3.8 mmol/L (ref 3.5–5.1)
Sodium: 138 mmol/L (ref 135–145)

## 2020-12-11 LAB — PROTIME-INR
INR: >10 (ref 0.8–1.2)
INR: 1 (ref 0.8–1.2)
Prothrombin Time: >90 seconds — ABNORMAL HIGH (ref 11.4–15.2)
Prothrombin Time: 13.4 seconds (ref 11.4–15.2)

## 2020-12-11 LAB — TROPONIN I (HIGH SENSITIVITY)
Troponin I (High Sensitivity): 3 ng/L (ref <18)
Troponin I (High Sensitivity): 5 ng/L (ref <18)

## 2020-12-11 LAB — APTT
aPTT: >200 seconds (ref 24–36)
aPTT: 27 seconds (ref 24–36)

## 2020-12-11 MED ORDER — MECLIZINE HCL 25 MG PO TABS
25.0000 mg | ORAL_TABLET | Freq: Once | ORAL | Status: AC
  Administered 2020-12-11: 25 mg via ORAL
  Filled 2020-12-11: qty 1

## 2020-12-11 MED ORDER — MECLIZINE HCL 25 MG PO TABS: 25.0000 mg | ORAL_TABLET | Freq: Three times a day (TID) | ORAL | 0 refills | Status: DC | PRN

## 2020-12-11 NOTE — ED Triage Notes (Signed)
Pt reports dizziness, headache, nausea, SOB, and chest tightness since waking up this morning.  Went to bed around 11pm last night and felt fine.  No neuro deficits noted.  Went to [facility] and sent to ED for abnormal EKG.

## 2020-12-11 NOTE — ED Provider Notes (Signed)
[facility] EMERGENCY DEPARTMENT Provider Note   CSN: [id number] Arrival date & time: 12/11/20  1332     History Chief Complaint  Patient presents with   Chest Pain    Alyssa Holmes is a 55 y.o. female with a past medical history of hypothyroidism, bipolar disorder presenting to the ED with a chief complaint of dizziness, chest pain, shortness of breath and nausea.  Symptoms began approximately 12 hours ago.  She states that symptoms have been intermittent.  She was evaluated by her PCP this morning and sent to the ER for an abnormal EKG.  States that as the hours have gone by, her symptoms have improved but will intermittently still feel chest tightness.  She was concerned that her dizziness and lightheadedness may be due to vertigo.  She had chiropractic work done yesterday about 24 hours ago for jaw pain.  She has had this done before.  She denies any manipulation of her neck.  She denies any headache, vision changes, vomiting, cough, fever, abdominal pain, numbness in arms or legs, anticoagulant use or recent medication changes.  No alcohol or drug use. She was seen by cardiologist about 5 months ago.  She is on a beta-blocker to help with palpitations and anxiety. No history of DVT, PE, MI, recent immobilization  HPI     Past Medical History:  Diagnosis Date   Bipolar disorder ([place])    Bundle branch block    Elevated cholesterol    dx in early 50s   Endometriosis    Female bladder prolapse    Fibroid    Headache(784.0)    Hypothyroidism    Mitral valve prolapse    during adolescence   Ovarian torsion    Pancreatitis    Shortness of breath     Patient Active Problem List   Diagnosis Date Noted   Sinus tachycardia 02/02/2019   Palpitations 10/11/2018   Hypoxia 06/15/2014   Bundle branch block    Shortness of breath    Hypothyroidism    Bipolar disorder ([place])    Fibroid    Endometriosis    Pancreatitis    Ovarian torsion    Female  bladder prolapse    Chest pain, unspecified 09/05/2013   Elevated cholesterol    Mitral valve prolapse     Past Surgical History:  Procedure Laterality Date   ABDOMINAL HYSTERECTOMY     APPENDECTOMY     CHOLECYSTECTOMY     COLONOSCOPY     DILATION AND CURETTAGE OF UTERUS     ESOPHAGOGASTRODUODENOSCOPY (EGD) WITH PROPOFOL N/A 09/09/2013   Procedure: ESOPHAGOGASTRODUODENOSCOPY (EGD) WITH PROPOFOL;  Surgeon: Arta Silence, MD;  Location: [facility] ENDOSCOPY;  Service: Endoscopy;  Laterality: N/A;   EUS N/A 09/09/2013   Procedure: ESOPHAGEAL ENDOSCOPIC ULTRASOUND (EUS) RADIAL;  Surgeon: Arta Silence, MD;  Location: [facility] ENDOSCOPY;  Service: Endoscopy;  Laterality: N/A;   HERNIA REPAIR     LAPAROSCOPY Left 11/15/2013   Procedure: LAPAROSCOPY OPERATIVE WITH LEFT SALPINGO-OOPHORECTOMY/LYSIS OF ADHESIONS/REMOVAL OF LEFT OVARIAN CYST;  Surgeon: Betsy Coder, MD;  Location: [facility];  Service: Gynecology;  Laterality: Left;   OOPHORECTOMY       OB History     Gravida  2   Para  2   Term  2   Preterm      AB      Living  2      SAB      IAB      Ectopic  Multiple      Live Births              Family History  Problem Relation Age of Onset   Thyroid disease Mother    Heart disease Mother    Diabetes Mother    Asthma Father    Stroke Father    Cancer - Colon Other     Social History   Tobacco Use   Smoking status: Former    Pack years: 0.00    Types: Cigarettes   Smokeless tobacco: Never  Substance Use Topics   Alcohol use: Yes    Comment: couple drinks a week   Drug use: No    Home Medications Prior to Admission medications   Medication Sig Start Date End Date Taking? Authorizing Provider  meclizine (ANTIVERT) 25 MG tablet Take 1 tablet (25 mg total) by mouth 3 (three) times daily as needed for dizziness. 12/11/20  Yes Paytin Ramakrishnan, PA-C  acetaminophen (TYLENOL) 500 MG tablet Take 1,000 mg by mouth every 6 (six) hours as needed for mild pain.    [provider]  B Complex-C (B-COMPLEX WITH VITAMIN C) tablet Take 1 tablet by mouth daily.    [provider]  Biotin 5000 MCG CAPS Take 1 capsule by mouth daily.    [provider]  BIOTIN PO Take 1 tablet by mouth daily.    [provider]  Cholecalciferol (VITAMIN D3) 125 MCG (5000 UT) CAPS Take 2 capsules by mouth daily.    [provider]  Cholecalciferol (VITAMIN D3) 2000 UNITS TABS Take 2 tablets by mouth daily.     [provider]  cyclobenzaprine (FLEXERIL) 10 MG tablet Take 1 tablet (10 mg total) by mouth 2 (two) times daily as needed for muscle spasms. 03/02/20   Dorie Rank, MD  hydrocortisone 2.5 % cream Apply 1 application topically daily. 06/22/20   [provider]  lamoTRIgine (LAMICTAL) 200 MG tablet Take 1 tablet by mouth every evening. 09/25/18   [provider]  levothyroxine (SYNTHROID) 88 MCG tablet Take 88 mcg by mouth daily. 05/13/20   [provider]  lidocaine (LIDODERM) 5 % Place 1 patch onto the skin daily. Remove & Discard patch within 12 hours or as directed by MD 03/02/20   Dorie Rank, MD  loratadine (CLARITIN) 10 MG tablet Take 1 tablet by mouth as needed.    [provider]  LORazepam (ATIVAN) 0.5 MG tablet Take 0.5 mg by mouth daily as needed for anxiety or sleep.     [provider]  MAGNESIUM PO Take 1 tablet by mouth daily.    [provider]  metoprolol succinate (TOPROL XL) 25 MG 24 hr tablet Take 1 tablet (25 mg total) by mouth 2 (two) times daily. 11/30/20   Nahser, Wonda Cheng, MD  metroNIDAZOLE (METROCREAM) 0.75 % cream Apply 1 application topically as needed. 06/22/20   [provider]  NIACIN PO Take 500 mg by mouth daily.     [provider]  venlafaxine XR (EFFEXOR-XR) 150 MG 24 hr capsule Take 1 capsule by mouth daily. 01/26/19   [provider]    Allergies    Vancomycin, Ibuprofen, Oxycodone-acetaminophen, Toradol [ketorolac  tromethamine], and Penicillins  Review of Systems   Review of Systems  Constitutional:  Negative for appetite change, chills and fever.  HENT:  Negative for ear pain, rhinorrhea, sneezing and sore throat.   Eyes:  Negative for photophobia and visual disturbance.  Respiratory:  Positive for  shortness of breath. Negative for cough, chest tightness and wheezing.   Cardiovascular:  Positive for chest pain. Negative for palpitations.  Gastrointestinal:  Negative for abdominal pain, blood in stool, constipation, diarrhea, nausea and vomiting.  Genitourinary:  Negative for dysuria, hematuria and urgency.  Musculoskeletal:  Negative for myalgias.  Skin:  Negative for rash.  Neurological:  Positive for dizziness and light-headedness. Negative for weakness.   Physical Exam Updated Vital Signs BP 122/67   Pulse 61   Temp 98.4 F (36.9 C) (Oral)   Resp 14   Ht 5\' 5"  (1.651 m)   Wt 69.4 kg   SpO2 100%   BMI 25.46 kg/m   Physical Exam Vitals and nursing note reviewed.  Constitutional:      General: She is not in acute distress.    Appearance: She is well-developed.  HENT:     Head: Normocephalic and atraumatic.     Nose: Nose normal.  Eyes:     General: No scleral icterus.       Left eye: No discharge.     Conjunctiva/sclera: Conjunctivae normal.  Cardiovascular:     Rate and Rhythm: Normal rate and regular rhythm.     Heart sounds: Normal heart sounds. No murmur heard.   No friction rub. No gallop.  Pulmonary:     Effort: Pulmonary effort is normal. No respiratory distress.     Breath sounds: Normal breath sounds.  Abdominal:     General: Bowel sounds are normal. There is no distension.     Palpations: Abdomen is soft.     Tenderness: There is no abdominal tenderness. There is no guarding.  Musculoskeletal:        General: Normal range of motion.     Cervical back: Normal range of motion and neck supple.     Right lower leg: No tenderness. No edema.     Left lower leg: No  tenderness. No edema.     Comments: No lower extremity edema, erythema or calf tenderness bilaterally.  Skin:    General: Skin is warm and dry.     Findings: No rash.  Neurological:     Mental Status: She is alert and oriented to person, place, and time.     Cranial Nerves: No cranial nerve deficit.     Sensory: No sensory deficit.     Motor: No weakness or abnormal muscle tone.     Coordination: Coordination normal.     Comments: Pupils reactive. No facial asymmetry noted. Cranial nerves appear grossly intact. Sensation intact to light touch on face, BUE and BLE. Strength 5/5 in BUE and BLE.    ED Results / Procedures / Treatments   Labs (all labs ordered are listed, but only abnormal results are displayed) Labs Reviewed  APTT - Abnormal; Notable for the following components:      Result Value   aPTT >200 (*)    All other components within normal limits  PROTIME-INR - Abnormal; Notable for the following components:   Prothrombin Time >90.0 (*)    INR >10.0 (*)    All other components within normal limits  BASIC METABOLIC PANEL  CBC  APTT  PROTIME-INR  TROPONIN I (HIGH SENSITIVITY)  TROPONIN I (HIGH SENSITIVITY)    EKG EKG Interpretation  Date/Time:  Tuesday December 11 2020 13:46:02 EDT Ventricular Rate:  61 PR Interval:  198 QRS Duration: 140 QT Interval:  468 QTC Calculation: 471 R Axis:   96 Text Interpretation: Normal sinus rhythm Right bundle branch  block Septal infarct , age undetermined No significant change since last tracing Confirmed by Blanchie Dessert [phone]) on 12/11/2020 2:28:57 PM  Radiology DG Chest 2 View  Result Date: 12/11/2020 CLINICAL DATA:  Chest pain, shortness of breath and dizziness today. EXAM: CHEST - 2 VIEW COMPARISON:  PA and lateral chest 03/02/2020. FINDINGS: Lungs clear. Heart size normal. No pneumothorax or pleural fluid. No acute or focal bony abnormality. IMPRESSION: Negative chest. Electronically Signed   By: Inge Rise M.D.    On: 12/11/2020 15:04    Procedures Procedures   Medications Ordered in ED Medications  meclizine (ANTIVERT) tablet 25 mg (25 mg Oral Given 12/11/20 1444)    ED Course  I have reviewed the triage vital signs and the nursing notes.  Pertinent labs & imaging results that were available during my care of the patient were reviewed by me and considered in my medical decision making (see chart for details).  Clinical Course as of 12/11/20 1821  Tue Dec 11, 2020  1442 Hemoglobin: 13.8 [HK]  1504 Troponin I (High Sensitivity): 3 [HK]  1504 Creatinine: 0.79 [HK]  1523 INR(!!): >10.0 [HK]  1523 APTT(!!): >200 [HK]  1523 Patient not on any blood thinners.  I will recheck PT/INR and APTT as I am unsure why these would be this significantly elevated. [HK]  1523 INR: 1.0 [HK]  1756 APTT: 27 [HK]  1803 Troponin I (High Sensitivity): 5 [HK]    Clinical Course User Index [HK] Delia Heady, PA-C   MDM Rules/Calculators/A&P                          55 year old female with past medical history of hypothyroidism, bipolar disorder presenting to the ED with a chief complaint of dizziness, chest pain, shortness of breath and nausea.  Symptoms began approximately 12 hours ago.  They have been intermittent.  Evaluated by her PCP this morning and sent to the ER for abnormal EKG.  Her symptoms have gradually improved since they began.  She was concerned that her dizziness and lightheadedness may be due to vertigo.  She had a chiropractic procedure done on her jaw for jaw pain within the past 24 hours but she has done this before.  No headache, vision changes, vomiting, abdominal pain, numbness in arms or legs, alcohol or drug use.  She continues to take her beta-blocker as prescribed by cardiology who she last saw 5 months ago.  This is helping with her palpitations and anxiety.  On exam patient without neurological deficits.  No numbness or weakness on exam.  No lower extremity edema, erythema or calf tenderness  bilaterally.  Vital signs within normal limits.  EKG shows sinus rhythm, right bundle branch block that has been present since her EKG in September 2021.  This has slightly improved actually today on today's EKG.  Her initial troponin is negative.  Hemoglobin creatinine within normal limits.  Coagula elation labs were ordered up in triage.  Initial INR came back as greater than 10 and APTT was greater than 200.  I do not have any reason for these to be significantly elevated and I am concerned that it may be due to a lab error, as she is not on anticoagulation medication.  These were redrawn and INR came back as 1 and a PTT is normal at 27.  Her delta troponin negative.  No neurological deficits that would concern me for a stroke, intracranial hemorrhage or other surgical emergent cause based  on her physical exam findings, vital signs and history.  I suspect that her symptoms could be due to vertigo as she feels better here with meclizine..  No hypoxia or tachycardia that would concern me for PE and she is low risk by Wells criteria.  Chest x-ray without any structural causes of this chest x-ray or shortness of breath.  I will have her continue her home medications and have her take meclizine as needed to help with her vertigo.  Return precautions given.    Patient is hemodynamically stable, in NAD, and able to ambulate in the ED. Evaluation does not show pathology that would require ongoing emergent intervention or inpatient treatment. I explained the diagnosis to the patient. Pain has been managed and has no complaints prior to discharge. Patient is comfortable with above plan and is stable for discharge at this time. All questions were answered prior to disposition. Strict return precautions for returning to the ED were discussed. Encouraged follow up with PCP.   An After Visit Summary was printed and given to the patient.   Portions of this note were generated with Lobby[employer]. Dictation  errors may occur despite best attempts at proofreading.  Final Clinical Impression(s) / ED Diagnoses Final diagnoses:  Chest wall pain  Vertigo    Rx / DC Orders ED Discharge Orders          Ordered    meclizine (ANTIVERT) 25 MG tablet  3 times daily PRN        12/11/20 1820             Delia Heady, PA-C 12/11/20 Newt Minion, MD 12/13/20 1234

## 2020-12-11 NOTE — Discharge Instructions (Addendum)
Take the meclizine as needed to help with your symptoms. Follow-up with your primary care provider. Return to the ER if you start to experience worsening chest pain, shortness of breath, increased dizziness, lightheadedness, numbness or weakness.

## 2020-12-11 NOTE — ED Provider Notes (Signed)
Emergency Medicine Provider Triage Evaluation Note  Makynna Manocchio , a 55 y.o. female  was evaluated in triage.  Pt complains of chest pain. States she woke up this am and felt dizzy/lightheaded. She also had chest pain, sob, nausea. Was seen at [facility] pta and sent here for abnormal ekg.  Review of Systems  Positive: Chest pain, sob, lightheaded, dizzy, nausea Negative: cough  Physical Exam  There were no vitals taken for this visit. Gen:   Awake, no distress   Resp:  Normal effort  MSK:   Moves extremities without difficulty  Other:  Cn II-XII intact. 5/5 strength/sensation to the bue/ble. Finger to nose and heel to shin normal bilat. Heart rrr, lungs ctab  Medical Decision Making  Medically screening exam initiated at 1:50 PM.  Appropriate orders placed.  Jeronda Don was informed that the remainder of the evaluation will be completed by another provider, this initial triage assessment does not replace that evaluation, and the importance of remaining in the ED until their evaluation is complete.     Bishop Dublin 12/11/20 1352    Blanchie Dessert, MD 12/13/20 1234

## 2020-12-12 DIAGNOSIS — G4733 Obstructive sleep apnea (adult) (pediatric): Secondary | ICD-10-CM | POA: Diagnosis not present

## 2020-12-31 DIAGNOSIS — R413 Other amnesia: Secondary | ICD-10-CM | POA: Diagnosis not present

## 2020-12-31 DIAGNOSIS — D352 Benign neoplasm of pituitary gland: Secondary | ICD-10-CM | POA: Diagnosis not present

## 2021-01-01 NOTE — Telephone Encounter (Signed)
Confirmed appt for 01-02-2021 with Dr Cicchillo.

## 2021-01-02 ENCOUNTER — Other Ambulatory Visit (HOSPITAL_BASED_OUTPATIENT_CLINIC_OR_DEPARTMENT_OTHER): Payer: Self-pay | Admitting: Family Medicine

## 2021-01-02 ENCOUNTER — Ambulatory Visit
Admit: 2021-01-02 | Discharge: 2021-01-02 | Payer: BLUE CROSS/BLUE SHIELD | Attending: Vascular Surgery | Primary: Family Medicine

## 2021-01-02 ENCOUNTER — Encounter: Attending: Vascular Surgery | Primary: Family Medicine

## 2021-01-02 DIAGNOSIS — I83893 Varicose veins of bilateral lower extremities with other complications: Secondary | ICD-10-CM

## 2021-01-02 DIAGNOSIS — D352 Benign neoplasm of pituitary gland: Secondary | ICD-10-CM

## 2021-01-02 NOTE — Progress Notes (Signed)
Vascular Surgery Outpatient Progress Note      Chief Complaint   Patient presents with    Follow-up     Here to go over test results from March       HISTORY OF PRESENT ILLNESS:                The patient is a 54 y.o. female who returns for follow-up evaluation of symptomatic varicose veins of the bilateral lower extremities. The patient states that her symptoms have not changed since she was last seen despite wearing compression stockings. The patient had a venous ultrasound that was negative for DVT. It did show severe reflux in the first branch off the left greater saphenous vein.     Past Medical History:        Diagnosis Date    Anxiety     Arthritis     Bronchitis     Leg pain     PONV (postoperative nausea and vomiting)     with anesthesia     Past Surgical History:        Procedure Laterality Date    BACK SURGERY  2019    ECTOPIC PREGNANCY SURGERY  1986    JOINT REPLACEMENT Left 06/2019    hip    NERVE BLOCK Left 07/20/2015    transforaminal nerve, lumbar #1    NERVE BLOCK Left 08/17/2015    NERVE BLOCK Left 09/06/2015    lumbar tfnb #3    NERVE BLOCK Left 11/01/2015    medial branch block, lumbar #1    NERVE BLOCK Right 11/15/2015    MBB #2    NERVE BLOCK Left 02/06/2016    lumbar radiofrequency     TUBAL LIGATION      UPPER GASTROINTESTINAL ENDOSCOPY N/A 03/16/2020    EGD BIOPSY performed by Loreli Slot, MD at [facility] ENDOSCOPY    VEIN SURGERY       Current Medications:   Prior to Admission medications    Medication Sig Start Date End Date Taking? Authorizing Provider   Multiple Vitamins-Minerals (THERAPEUTIC MULTIVITAMIN-MINERALS) tablet Take 1 tablet by mouth daily   Yes Historical Provider, MD   Elastic Bandages & Supports (JOBST KNEE HIGH COMPRESSION SM) MISC Knee high compression stockings, 20-30 mm/Hg 08/22/20  Yes Jinny Blossom, MD   Omeprazole (PRILOSEC PO) Take by mouth daily   Yes Historical Provider, MD   acetaminophen (TYLENOL) 325 MG tablet Take 650 mg by mouth every 6 hours as needed for  Pain   Yes Historical Provider, MD   sertraline (ZOLOFT) 50 MG tablet take 1 tablet by mouth once daily 03/06/15  Yes Historical Provider, MD     Allergies:  Ibuprofen    Social History     Socioeconomic History    Marital status: Single     Spouse name: Not on file    Number of children: Not on file    Years of education: Not on file    Highest education level: Not on file   Occupational History    Not on file   Tobacco Use    Smoking status: Every Day     Packs/day: 0.50     Years: 30.00     Pack years: 15.00     Types: Cigarettes    Smokeless tobacco: Never   Substance and Sexual Activity    Alcohol use: No    Drug use: No    Sexual activity: Not on file   Other Topics Concern  Not on file   Social History Narrative    Not on file     Social Determinants of Health     Financial Resource Strain: Not on file   Food Insecurity: Not on file   Transportation Needs: Not on file   Physical Activity: Not on file   Stress: Not on file   Social Connections: Not on file   Intimate Partner Violence: Not on file   Housing Stability: Not on file        No family history on file.    REVIEW OF SYSTEMS (New symptoms):    Eyes:      Blurred vision:  No [x] []                Diplopia:   No [x] Valentino Hue []                Vision loss:       No [x] /Yes []    Ears, nose, throat:             Hearing loss:    No [x] []       Vertigo:   No [x] []                        Swallowing problem:  No [x] Valentino Hue []                Nose bleeds:   No [x] []       Voice hoarseness:  No [x] []   Respiratory:             Cough:   No [x] []       Pleuritic chest pain:  No [x] []                         Dyspnea:   No [x] Valentino Hue []       Wheezing:   No [x] /Yes []   Cardiovascular:             Angina:   No [x] []       Palpitations:   No [x] Valentino Hue []           Claudication:    No [x] []       Leg swelling:   No [x] []   Gastrointestinal:             Nausea or vomiting:  No [x] Valentino Hue []                Abdominal pain:  No [x] []                       Intestinal bleeding: No [x] []   Musculoskeletal:             Leg pain:   No [] Valentino Hue [x]       Back pain:   No [x] []                     Weakness:   No [x] []   Neurologic:             Numbness:   No [x] Valentino Hue []       Paralysis:   No [x] /Yes []                        Headaches:   No [x] /Yes []   Hematologic, lymphatic:   Anemia:   No [x] []               Bleeding or bruising:  No [x] []               Fevers or chills: No [x] Valentino Hue []   Endocrine:             Temp intolerance:   No [x] []                        Polydipsia, polyuria:  No [x] []   Skin:              Rash:    No [x] Valentino Hue []       Ulcers:   No [x] []               Abnorm pigment: No [x] /Yes []   GU:              Frequency/urgency:  No [x] /Yes []       Hematuria:    No [x] /Yes []                       Incontinence:    No [x] /Yes []     PHYSICAL EXAM:  Vitals:    01/02/21 1357   BP: 110/70     General Appearance: alert and oriented to person, place and time, in no acute distress, well developed and well- nourished  Neurologic: no cranial nerve deficit, speech normal  Head: normocephalic and atraumatic  Eyes: extraocular eye movements intact, conjunctivae normal  ENT: external ear and ear canal normal bilaterally, nose without deformity, no carotid bruits  Pulmonary/Chest: normal air movement, no respiratory distress  Cardiovascular: normal rate, regular rhyth  Abdomen: non-distended, no masses  Musculoskeletal: no joint deformity or tenderness  Extremities: no leg edema bilaterally. Large, bulging varicose veins bilateral lateral thighs and calves in distribution of anterolateral vein branch   Skin: warm and dry, no rash or erythema    PULSE EXAM      Right      Left   Brachial     Radial 2 2   Femoral     Popliteal     Dorsalis Pedis 2 2   Posterior Tibial 2 2   (3=normal, 2=diminished, 1=barely palpable, 4=widened)    RADIOLOGY: VMSA today    Problem List Items Addressed This Visit    None  Visit Diagnoses       Symptomatic varicose veins of  both lower extremities    -  Primary            I reviewed the results of the ultrasound with the patient. She may benefit from stab phlebectomy of the varicose vein branches. This will be staged, left leg will be done first. The procedure including risks, benefits, and alternative options were discussed. The patient understands and wishes to proceed.     Pt seen and plan reviewed with Dr. .     , APRN - CNP      No follow-ups on file.

## 2021-01-04 NOTE — Telephone Encounter (Signed)
Spoke with patient, scheduled L lower extremity stab phlebectomies at [facility] on 02-15-21.

## 2021-01-05 ENCOUNTER — Ambulatory Visit (HOSPITAL_BASED_OUTPATIENT_CLINIC_OR_DEPARTMENT_OTHER): Payer: Federal, State, Local not specified - PPO

## 2021-01-12 ENCOUNTER — Other Ambulatory Visit: Payer: Self-pay

## 2021-01-12 ENCOUNTER — Ambulatory Visit (HOSPITAL_BASED_OUTPATIENT_CLINIC_OR_DEPARTMENT_OTHER)
Admission: RE | Admit: 2021-01-12 | Discharge: 2021-01-12 | Disposition: A | Discharge status: Home / Self Care | Payer: Federal, State, Local not specified - PPO | Source: Ambulatory Visit | Attending: Family Medicine | Admitting: Family Medicine

## 2021-01-12 DIAGNOSIS — D352 Benign neoplasm of pituitary gland: Secondary | ICD-10-CM | POA: Insufficient documentation

## 2021-01-12 DIAGNOSIS — R519 Headache, unspecified: Secondary | ICD-10-CM | POA: Diagnosis not present

## 2021-01-12 MED ORDER — GADOBUTROL 1 MMOL/ML IV SOLN
6.0000 mL | Freq: Once | INTRAVENOUS | Status: AC | PRN
  Administered 2021-01-12: 6 mL via INTRAVENOUS

## 2021-01-15 DIAGNOSIS — K317 Polyp of stomach and duodenum: Secondary | ICD-10-CM | POA: Diagnosis not present

## 2021-01-15 DIAGNOSIS — R1013 Epigastric pain: Secondary | ICD-10-CM | POA: Diagnosis not present

## 2021-01-15 DIAGNOSIS — K2930 Chronic superficial gastritis without bleeding: Secondary | ICD-10-CM | POA: Diagnosis not present

## 2021-01-28 DIAGNOSIS — M9901 Segmental and somatic dysfunction of cervical region: Secondary | ICD-10-CM | POA: Diagnosis not present

## 2021-01-28 DIAGNOSIS — M9902 Segmental and somatic dysfunction of thoracic region: Secondary | ICD-10-CM | POA: Diagnosis not present

## 2021-01-28 DIAGNOSIS — M531 Cervicobrachial syndrome: Secondary | ICD-10-CM | POA: Diagnosis not present

## 2021-01-28 DIAGNOSIS — M9903 Segmental and somatic dysfunction of lumbar region: Secondary | ICD-10-CM | POA: Diagnosis not present

## 2021-02-08 DIAGNOSIS — E039 Hypothyroidism, unspecified: Secondary | ICD-10-CM | POA: Diagnosis not present

## 2021-02-08 DIAGNOSIS — E041 Nontoxic single thyroid nodule: Secondary | ICD-10-CM | POA: Diagnosis not present

## 2021-02-11 NOTE — Progress Notes (Signed)
[facility]   PRE-ADMISSION TESTING GENERAL INSTRUCTIONS  PAT Phone Number: [phone]      GENERAL INSTRUCTIONS:    [x]  Antibacterial Soap Shower Night before and/or AM of surgery.  []  CHG Wipes instruction sheet and wipes given.  []  Hibiclens shower the night before and the morning of surgery.   -Do not use Hibiclens on your face or head.   [x]  Do not wear makeup, lotions, powders, deodorant.   [x]  Nothing by mouth after midnight; including gum, candy, mints, or water.  [x]  You may brush your teeth, gargle, but do NOT swallow water.   [x]  No tobacco products, illegal drugs, or alcohol within 24 hours of your surgery.  [x]  Jewelry or valuables should not be brought to the hospital. All body and/or tongue piercing's must be removed prior to arriving to hospital. No contact lens or hair pins.   [x]  Arrange transportation with a responsible adult companion to and from the hospital. Arrange for someone to be with you for the remainder of the day and for 24 hours after your procedure due to having had anesthesia.          -Who will be your companion for transportation?__fiance________________         -Who will be staying with you for 24 hrs after your procedure?___fiance_______________  [x]  Bring insurance card and photo ID.  []  Bring copy of living will or healthcare power of attorney papers to be placed in your electronic record.  []  Urine Pregnancy test will be preformed the day of surgery. A specimen sample may be brought from home.  []  Transfusion Bracelet: Please bring with you to hospital, day of surgery.     PARKING INSTRUCTIONS:     [x]  ARRIVAL DATE & TIME: 9/2 0730  [x]  Enter into the . Two people may accompany you. Masks are required.   [x]  Parking Lot "I" is where you will park. It is located on the corner of and . The entrance is on [facility].   Upon entering the parking lot, a voucher ticket will print    EDUCATION INSTRUCTIONS:         []  Knee or Hip replacement booklet & exercise pamphlets given.     []  [facility] Pamphlet placed in chart.   []  Pre-admission Testing educational folder given  []  Incentive Spirometry,coughing & deep breathing exercises reviewed.     []  Medication information sheet(s)   []  Fluoroscopy-Xray used in surgery reviewed with patient. Educational pamphlet placed in chart.  []  Pain: Post-op pain is normal and to be expected. You will be asked to rate your pain from 0-10.  []  Joint camp offered.   []  Joint replacement booklets given.  []  Spine Navigator to see in PAT.  []  Other:___________________________    MEDICATION INSTRUCTIONS:    [x]  Bring a complete list of your medications, please write the last time you took the medicine, give this list to the nurse in Pre-Op.  [x]  Take only the following medications the morning of surgery with 1-2 ounces of water: zoloft, prilosec  [x]  Stop all herbal supplements and vitamins 5 days before surgery. Stop NSAIDS 7 days before surgery.  []  DO NOT take any diabetic medicine the morning of surgery.  Follow instructions for insulin the day before surgery.  []  If you are diabetic and your blood sugar is low or you feel symptomatic, you may drink 1-2 ounces of apple juice or take a glucose tablet.            -  The morning of your procedure, you may call the pre-op area if you have concerns about your blood sugar [phone].  []  Use your inhalers the morning of surgery.  Bring your emergency inhaler with you day of surgery.  []  Follow physician instructions regarding any blood thinners you may be taking.    WHAT TO EXPECT:    [x]  The day of surgery you will be greeted and checked in by the [employer].  In addition, you will be registered in the [facility] by a Patient Barista. Please bring your photo ID and insurance card. A nurse will greet you in accordance to the time you are needed in the pre-op area to prepare you for  surgery. Please do not be discouraged if you are not greeted in the order you arrive as there are many variables that are involved in patient preparation.  Your patience is greatly appreciated as you wait for your nurse.  Please bring in items such as: books, magazines, newspapers, electronics, or any other items  to occupy your time in the waiting area.    [x]   Delays may occur with surgery and staff will make a sincere effort to keep you informed of delays.  If any delays occur with your procedure, we apologize ahead of time for your inconvenience as we recognize the value of your time.

## 2021-02-15 ENCOUNTER — Inpatient Hospital Stay: Payer: BLUE CROSS/BLUE SHIELD

## 2021-02-15 LAB — BASIC METABOLIC PANEL W/ REFLEX TO MG FOR LOW K
Anion Gap: 9 mmol/L (ref 7–16)
BUN: 13 mg/dL (ref 6–20)
CO2: 23 mmol/L (ref 22–29)
Calcium: 9 mg/dL (ref 8.6–10.2)
Chloride: 110 mmol/L — ABNORMAL HIGH (ref 98–107)
Creatinine: 1 mg/dL (ref 0.5–1.0)
GFR African American: >60
GFR Non-African American: 58 mL/min/{1.73_m2} (ref >=60)
Glucose: 124 mg/dL — ABNORMAL HIGH (ref 74–99)
Potassium reflex Magnesium: 4.6 mmol/L (ref 3.5–5.0)
Sodium: 142 mmol/L (ref 132–146)

## 2021-02-15 LAB — PROTIME-INR
INR: 1
Protime: 10.9 s (ref 9.3–12.4)

## 2021-02-15 LAB — CBC
Hematocrit: 34.6 % (ref 34.0–48.0)
Hemoglobin: 11.1 g/dL — ABNORMAL LOW (ref 11.5–15.5)
MCH: 28.6 pg (ref 26.0–35.0)
MCHC: 32.1 % (ref 32.0–34.5)
MCV: 89.2 fL (ref 80.0–99.9)
MPV: 10 fL (ref 7.0–12.0)
Platelets: 290 E9/L (ref 130–450)
RBC: 3.88 E12/L (ref 3.50–5.50)
RDW: 14.5 fL (ref 11.5–15.0)
WBC: 7.3 E9/L (ref 4.5–11.5)

## 2021-02-15 MED ORDER — DEXAMETHASONE SODIUM PHOSPHATE 10 MG/ML IJ SOLN
10 MG/ML | INTRAMUSCULAR | Status: DC | PRN
Start: 2021-02-15 — End: 2021-02-15
  Administered 2021-02-15: 14:00:00 10 via INTRAVENOUS

## 2021-02-15 MED ORDER — KETAMINE HCL 50 MG/5ML IJ SOSY
50 MG/5ML | INTRAMUSCULAR | Status: DC | PRN
Start: 2021-02-15 — End: 2021-02-15
  Administered 2021-02-15: 14:00:00 50 via INTRAVENOUS

## 2021-02-15 MED ORDER — PROCHLORPERAZINE EDISYLATE 10 MG/2ML IJ SOLN
10 MG/2ML | Freq: Once | INTRAMUSCULAR | Status: DC | PRN
Start: 2021-02-15 — End: 2021-02-15

## 2021-02-15 MED ORDER — SODIUM CHLORIDE 0.9 % IV SOLN
0.9 % | INTRAVENOUS | Status: DC | PRN
Start: 2021-02-15 — End: 2021-02-15

## 2021-02-15 MED ORDER — HALOPERIDOL LACTATE 5 MG/ML IJ SOLN
5 MG/ML | Freq: Once | INTRAMUSCULAR | Status: DC | PRN
Start: 2021-02-15 — End: 2021-02-15

## 2021-02-15 MED ORDER — HYDRALAZINE HCL 20 MG/ML IJ SOLN
20 MG/ML | INTRAMUSCULAR | Status: DC | PRN
Start: 2021-02-15 — End: 2021-02-15

## 2021-02-15 MED ORDER — HYDROMORPHONE HCL 2 MG/ML IJ SOLN
2 MG/ML | INTRAMUSCULAR | Status: DC | PRN
Start: 2021-02-15 — End: 2021-02-15
  Administered 2021-02-15: 14:00:00 .5 via INTRAVENOUS

## 2021-02-15 MED ORDER — NORMAL SALINE FLUSH 0.9 % IV SOLN
0.9 % | INTRAVENOUS | Status: DC | PRN
Start: 2021-02-15 — End: 2021-02-15

## 2021-02-15 MED ORDER — ONDANSETRON HCL 4 MG/2ML IJ SOLN
4 MG/2ML | INTRAMUSCULAR | Status: AC
Start: 2021-02-15 — End: ?

## 2021-02-15 MED ORDER — HYDROMORPHONE HCL 1 MG/ML IJ SOLN
1 MG/ML | INTRAMUSCULAR | Status: DC | PRN
Start: 2021-02-15 — End: 2021-02-15

## 2021-02-15 MED ORDER — NORMAL SALINE FLUSH 0.9 % IV SOLN
0.9 | INTRAVENOUS | Status: DC | PRN
Start: 2021-02-15 — End: 2021-02-15

## 2021-02-15 MED ORDER — LIDOCAINE HCL 1 % IJ SOLN
1 % | INTRAMUSCULAR | Status: AC
Start: 2021-02-15 — End: ?

## 2021-02-15 MED ORDER — LIDOCAINE HCL (PF) 2 % IJ SOLN
2 % | INTRAMUSCULAR | Status: AC
Start: 2021-02-15 — End: ?

## 2021-02-15 MED ORDER — SODIUM CHLORIDE 0.9 % IV SOLN
0.9 | INTRAVENOUS | Status: DC | PRN
Start: 2021-02-15 — End: 2021-02-15
  Administered 2021-02-15: 13:00:00 via INTRAVENOUS

## 2021-02-15 MED ORDER — PHENYLEPHRINE HCL 1 MG/10ML IV SOSY
1 MG/0ML | INTRAVENOUS | Status: AC
Start: 2021-02-15 — End: ?

## 2021-02-15 MED ORDER — MIDAZOLAM HCL 2 MG/2ML IJ SOLN
2 MG/ML | INTRAMUSCULAR | Status: AC
Start: 2021-02-15 — End: ?

## 2021-02-15 MED ORDER — OXYCODONE-ACETAMINOPHEN 5-325 MG PO TABS
5-325 MG | ORAL_TABLET | Freq: Four times a day (QID) | ORAL | 0 refills | Status: AC | PRN
Start: 2021-02-15 — End: 2021-02-18
  Filled 2021-02-15: qty 12, 3d supply, fill #0

## 2021-02-15 MED ORDER — OXYCODONE HCL 5 MG PO TABS
5 MG | Freq: Once | ORAL | Status: DC | PRN
Start: 2021-02-15 — End: 2021-02-15

## 2021-02-15 MED ORDER — BUPIVACAINE HCL (PF) 0.25 % IJ SOLN
0.25 % | INTRAMUSCULAR | Status: AC
Start: 2021-02-15 — End: ?

## 2021-02-15 MED ORDER — HYDROMORPHONE HCL 2 MG/ML IJ SOLN
2 MG/ML | INTRAMUSCULAR | Status: AC
Start: 2021-02-15 — End: ?

## 2021-02-15 MED ORDER — PROPOFOL 500 MG/50ML IV EMUL
500 MG/50ML | INTRAVENOUS | Status: AC
Start: 2021-02-15 — End: ?

## 2021-02-15 MED ORDER — NORMAL SALINE FLUSH 0.9 % IV SOLN
0.9 | Freq: Two times a day (BID) | INTRAVENOUS | Status: DC
Start: 2021-02-15 — End: 2021-02-15

## 2021-02-15 MED ORDER — ONDANSETRON HCL 4 MG/2ML IJ SOLN
4 MG/2ML | INTRAMUSCULAR | Status: DC | PRN
Start: 2021-02-15 — End: 2021-02-15
  Administered 2021-02-15: 14:00:00 4 via INTRAVENOUS

## 2021-02-15 MED ORDER — PHENYLEPHRINE HCL 1 MG/10ML IV SOSY
110 MG/0ML | INTRAVENOUS | Status: DC | PRN
Start: 2021-02-15 — End: 2021-02-15
  Administered 2021-02-15: 14:00:00 100 via INTRAVENOUS

## 2021-02-15 MED ORDER — FENTANYL CITRATE (PF) 100 MCG/2ML IJ SOLN
100 MCG/2ML | INTRAMUSCULAR | Status: DC | PRN
Start: 2021-02-15 — End: 2021-02-15
  Administered 2021-02-15 (×2): 50 via INTRAVENOUS

## 2021-02-15 MED ORDER — NORMAL SALINE FLUSH 0.9 % IV SOLN
0.9 % | Freq: Two times a day (BID) | INTRAVENOUS | Status: DC
Start: 2021-02-15 — End: 2021-02-15

## 2021-02-15 MED ORDER — DIPHENHYDRAMINE HCL 50 MG/ML IJ SOLN
50 MG/ML | Freq: Once | INTRAMUSCULAR | Status: DC | PRN
Start: 2021-02-15 — End: 2021-02-15

## 2021-02-15 MED ORDER — IPRATROPIUM-ALBUTEROL 0.5-2.5 (3) MG/3ML IN SOLN
Freq: Once | RESPIRATORY_TRACT | Status: DC | PRN
Start: 2021-02-15 — End: 2021-02-15

## 2021-02-15 MED ORDER — MEPERIDINE HCL 25 MG/ML IJ SOLN
25 MG/ML | INTRAMUSCULAR | Status: DC | PRN
Start: 2021-02-15 — End: 2021-02-15

## 2021-02-15 MED ORDER — MIDAZOLAM HCL 2 MG/2ML IJ SOLN
2 MG/ML | INTRAMUSCULAR | Status: DC | PRN
Start: 2021-02-15 — End: 2021-02-15
  Administered 2021-02-15: 13:00:00 2 via INTRAVENOUS

## 2021-02-15 MED ORDER — LABETALOL HCL 5 MG/ML IV SOLN
5 MG/ML | INTRAVENOUS | Status: DC | PRN
Start: 2021-02-15 — End: 2021-02-15

## 2021-02-15 MED ORDER — LORAZEPAM 2 MG/ML IJ SOLN
2 MG/ML | Freq: Once | INTRAMUSCULAR | Status: DC | PRN
Start: 2021-02-15 — End: 2021-02-15

## 2021-02-15 MED ORDER — LIDOCAINE HCL (PF) 2 % IJ SOLN
2 % | INTRAMUSCULAR | Status: DC | PRN
Start: 2021-02-15 — End: 2021-02-15
  Administered 2021-02-15: 13:00:00 60 via INTRAVENOUS

## 2021-02-15 MED ORDER — PROPOFOL 500 MG/50ML IV EMUL
500 MG/50ML | INTRAVENOUS | Status: DC | PRN
Start: 2021-02-15 — End: 2021-02-15
  Administered 2021-02-15 (×2): 80 via INTRAVENOUS

## 2021-02-15 MED ORDER — PROPOFOL 500 MG/50ML IV EMUL
50050 MG/50ML | INTRAVENOUS | Status: DC | PRN
Start: 2021-02-15 — End: 2021-02-15
  Administered 2021-02-15: 13:00:00 100 via INTRAVENOUS

## 2021-02-15 MED ORDER — SCOPOLAMINE 1 MG/3DAYS TD PT72
13 MG/3DAYS | Freq: Once | TRANSDERMAL | Status: DC
Start: 2021-02-15 — End: 2021-02-15
  Administered 2021-02-15: 12:00:00 1 via TRANSDERMAL

## 2021-02-15 MED ORDER — BUPIVACAINE HCL 0.25 % IJ SOLN (MIXTURES ONLY)
0.25 % | INTRAMUSCULAR | Status: DC | PRN
Start: 2021-02-15 — End: 2021-02-15
  Administered 2021-02-15: 14:00:00 40 via INTRADERMAL

## 2021-02-15 MED ORDER — KETAMINE HCL 50 MG/5ML IJ SOSY
50 MG/5ML | INTRAMUSCULAR | Status: AC
Start: 2021-02-15 — End: ?

## 2021-02-15 MED ORDER — HEPARIN SODIUM (PORCINE) 1000 UNIT/ML IJ SOLN
1000 UNIT/ML | INTRAMUSCULAR | Status: AC
Start: 2021-02-15 — End: ?

## 2021-02-15 MED ORDER — SODIUM CHLORIDE 0.9 % IV SOLN
0.9 % | INTRAVENOUS | Status: DC
Start: 2021-02-15 — End: 2021-02-15
  Administered 2021-02-15: 13:00:00 via INTRAVENOUS

## 2021-02-15 MED ORDER — DEXAMETHASONE SODIUM PHOSPHATE 10 MG/ML IJ SOLN
10 MG/ML | INTRAMUSCULAR | Status: AC
Start: 2021-02-15 — End: ?

## 2021-02-15 MED ORDER — FENTANYL CITRATE (PF) 100 MCG/2ML IJ SOLN
100 MCG/2ML | INTRAMUSCULAR | Status: AC
Start: 2021-02-15 — End: ?

## 2021-02-15 MED ORDER — CEFAZOLIN SODIUM 2 G IJ SOLR
2 | INTRAMUSCULAR | Status: AC
Start: 2021-02-15 — End: 2021-02-15
  Administered 2021-02-15: 14:00:00 2000 mg via INTRAVENOUS

## 2021-02-15 MED FILL — BUPIVACAINE HCL (PF) 0.25 % IJ SOLN: 0.25 % | INTRAMUSCULAR | Qty: 20

## 2021-02-15 MED FILL — LIDOCAINE HCL (PF) 2 % IJ SOLN: 2 % | INTRAMUSCULAR | Qty: 5

## 2021-02-15 MED FILL — PHENYLEPHRINE HCL 1 MG/10ML IV SOSY: 1 MG/0ML | INTRAVENOUS | Qty: 10

## 2021-02-15 MED FILL — PROPOFOL 500 MG/50ML IV EMUL: 500 MG/50ML | INTRAVENOUS | Qty: 50

## 2021-02-15 MED FILL — MIDAZOLAM HCL 2 MG/2ML IJ SOLN: 2 mg/mL | INTRAMUSCULAR | Qty: 2

## 2021-02-15 MED FILL — XYLOCAINE 1 % IJ SOLN: 1 % | INTRAMUSCULAR | Qty: 20

## 2021-02-15 MED FILL — ONDANSETRON HCL 4 MG/2ML IJ SOLN: 4 MG/2ML | INTRAMUSCULAR | Qty: 2

## 2021-02-15 MED FILL — CEFAZOLIN SODIUM 2 G IJ SOLR: 2 g | INTRAMUSCULAR | Qty: 2000

## 2021-02-15 MED FILL — HEPARIN SODIUM (PORCINE) 1000 UNIT/ML IJ SOLN: 1000 [IU]/mL | INTRAMUSCULAR | Qty: 30

## 2021-02-15 MED FILL — SCOPOLAMINE 1 MG/3DAYS TD PT72: 1 MG/3DAYS | TRANSDERMAL | Qty: 1

## 2021-02-15 MED FILL — HYDROMORPHONE HCL 2 MG/ML IJ SOLN: 2 mg/mL | INTRAMUSCULAR | Qty: 1

## 2021-02-15 MED FILL — DEXAMETHASONE SODIUM PHOSPHATE 10 MG/ML IJ SOLN: 10 mg/mL | INTRAMUSCULAR | Qty: 1

## 2021-02-15 MED FILL — KETAMINE HCL 50 MG/5ML IJ SOSY: 50 MG/5ML | INTRAMUSCULAR | Qty: 5

## 2021-02-15 MED FILL — IPRATROPIUM-ALBUTEROL 0.5-2.5 (3) MG/3ML IN SOLN: 0.5-2.5 (3) MG/3ML | RESPIRATORY_TRACT | Qty: 3

## 2021-02-15 MED FILL — FENTANYL CITRATE (PF) 100 MCG/2ML IJ SOLN: 100 MCG/2ML | INTRAMUSCULAR | Qty: 2

## 2021-02-15 NOTE — Anesthesia Pre-Procedure Evaluation (Addendum)
Department of Anesthesiology  Preprocedure Note       Name:  Lauren Hull   Age:  56 y.o.  DOB:  [birth year]                                          MRN:  [id number]         Date:  02/14/2021      Surgeon: Juliann Mule):  Antony Madura, MD    Procedure: Procedure(s):  STAB PHLEBECTOMIES LEFT LOWER EXTREMITY    Medications prior to admission:   Prior to Admission medications    Medication Sig Start Date End Date Taking? Authorizing Provider   Multiple Vitamins-Minerals (THERAPEUTIC MULTIVITAMIN-MINERALS) tablet Take 1 tablet by mouth daily    Historical Provider, MD   Elastic Bandages & Supports (JOBST KNEE HIGH COMPRESSION SM) MISC Knee high compression stockings, 20-30 mm/Hg 08/22/20   Antony Madura, MD   Omeprazole (PRILOSEC PO) Take by mouth daily    Historical Provider, MD   acetaminophen (TYLENOL) 325 MG tablet Take 650 mg by mouth every 6 hours as needed for Pain    Historical Provider, MD   sertraline (ZOLOFT) 50 MG tablet take 1 tablet by mouth once daily 03/06/15   Historical Provider, MD       Current medications:    Current Outpatient Medications   Medication Sig Dispense Refill   ??? Multiple Vitamins-Minerals (THERAPEUTIC MULTIVITAMIN-MINERALS) tablet Take 1 tablet by mouth daily     ??? Elastic Bandages & Supports (JOBST KNEE HIGH COMPRESSION SM) MISC Knee high compression stockings, 20-30 mm/Hg 2 each 3   ??? Omeprazole (PRILOSEC PO) Take by mouth daily     ??? acetaminophen (TYLENOL) 325 MG tablet Take 650 mg by mouth every 6 hours as needed for Pain     ??? sertraline (ZOLOFT) 50 MG tablet take 1 tablet by mouth once daily  0     No current facility-administered medications for this visit.       Allergies:    Allergies   Allergen Reactions   ??? Ibuprofen Shortness Of Breath and Itching       Problem List:    Patient Active Problem List   Diagnosis Code   ??? DDD (degenerative disc disease), lumbar M51.36   ??? Lumbar spinal stenosis M48.061   ??? Facet arthropathy, lumbar  M47.816   ??? 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 Lumbar radiculopathy M54.16   ??? Chronic pain G89.29   ??? Disc displacement, lumbar M51.26   ??? 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 Osteoarthritis M19.90   ??? RUQ pain R10.11   ??? 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 Anxiety    ??? Arthritis    ??? Bronchitis    ??? Leg pain    ??? PONV (postoperative nausea and vomiting)     with anesthesia       Past Surgical History:        Procedure Laterality Date   ??? BACK SURGERY  2019   ??? ECTOPIC PREGNANCY SURGERY  1986   ??? JOINT REPLACEMENT Left 06/2019    hip   ??? NERVE BLOCK Left 07/20/2015    transforaminal nerve, lumbar #1   ???  NERVE BLOCK Left 08/17/2015   ??? NERVE BLOCK Left 09/06/2015    lumbar tfnb #3   ??? NERVE BLOCK Left 11/01/2015    medial branch block, lumbar #1   ??? NERVE BLOCK Right 11/15/2015    MBB #2   ??? NERVE BLOCK Left 02/06/2016    lumbar radiofrequency    ??? TUBAL LIGATION     ??? UPPER GASTROINTESTINAL ENDOSCOPY N/A 03/16/2020    EGD BIOPSY performed by Kristen Cardinal, MD at [place]   ??? VEIN SURGERY         Social History:    Social History     Tobacco Use   ??? Smoking status: Every Day     Packs/day: 0.50     Years: 30.00     Pack years: 15.00     Types: Cigarettes   ??? Smokeless tobacco: Never   Substance Use Topics   ??? Alcohol use: No                                Ready to quit: Not Answered  Counseling given: Not Answered      Vital Signs (Current):   There were no vitals filed for this visit.                                           BP Readings from Last 3 Encounters:   01/02/21 110/70   08/22/20 132/84   08/01/20 (!) 142/68       NPO Status:  > 8 hours                                                                               BMI:   Wt Readings from Last 3 Encounters:   01/02/21 205 lb (93 kg)   08/22/20 212 lb (96.2 kg)   03/20/20 209 lb (94.8 kg)     There is no height or weight on file to calculate BMI.    CBC:    Lab Results   Component Value Date/Time    WBC 11.7 07/31/2020 08:05 PM    RBC 3.92 07/31/2020 08:05 PM    HGB 11.3 07/31/2020 08:05 PM    HCT 36.2 07/31/2020 08:05 PM    MCV 92.3 07/31/2020 08:05 PM    RDW 14.0 07/31/2020 08:05 PM    PLT 313 07/31/2020 08:05 PM       CMP:   Lab Results   Component Value Date/Time    NA 140 07/31/2020 08:05 PM    K 3.9 07/31/2020 08:05 PM    K 4.1 03/20/2020 09:43 PM    CL 104 07/31/2020 08:05 PM    CO2 25 07/31/2020 08:05 PM    BUN 19 07/31/2020 08:05 PM    CREATININE 1.1 07/31/2020 08:05 PM    GFRAA >60 07/31/2020 08:05 PM    LABGLOM 52 07/31/2020 08:05 PM    GLUCOSE 94 07/31/2020 08:05 PM    PROT 7.2 07/31/2020 08:05 PM    CALCIUM 9.4 07/31/2020 08:05 PM  BILITOT <0.2 07/31/2020 08:05 PM    ALKPHOS 72 07/31/2020 08:05 PM    AST 15 07/31/2020 08:05 PM    ALT 15 07/31/2020 08:05 PM       POC Tests: No results for input(s): POCGLU, POCNA, POCK, POCCL, POCBUN, POCHEMO, POCHCT in the last 72 hours.    Coags: No results found for: PROTIME, INR, APTT    HCG (If Applicable): No results found for: PREGTESTUR, PREGSERUM, HCG, HCGQUANT     ABGs: No results found for: PHART, PO2ART, PCO2ART, HCO3ART, BEART, O2SATART     Type & Screen (If Applicable):  No results found for: LABABO, LABRH    Drug/Infectious Status (If Applicable):  No results found for: HIV, HEPCAB    COVID-19 Screening (If Applicable):   Lab Results   Component Value Date/Time    COVID19 Not Detected 03/21/2020 12:11 AM    COVID19 Not Detected 03/13/2020 08:14 AM           Anesthesia Evaluation  Patient summary reviewed and Nursing notes reviewed   history of anesthetic complications: PONV.  Airway: Mallampati: III  TM distance: >3 FB   Neck ROM: full  Mouth opening: > = 3 FB   Dental:    (+) caps  Comment: Dentition intact, missing multiple molars, denies any loose teeth.    Pulmonary: breath sounds clear to auscultation  (+) COPD:  decreased breath sounds current smoker ( Every Day - 0.5-1.5 PPD x 40 years )    Sleep  apnea:  Probable OSA as she reports daytime sleepiness. Advised to obtain a sleep study.          Patient did not smoke on day of surgery.                 Cardiovascular:Negative CV ROS  Exercise tolerance: poor (<4 METS),   (+) hyperlipidemia        Rhythm: regular             Beta Blocker:  Not on Beta Blocker         Neuro/Psych:   (+) neuromuscular disease ( Lumbar spinal stenosis):, psychiatric history:depression/anxiety             GI/Hepatic/Renal:   (+) GERD:,          ROS comment: Right upper quadrant pain.   Endo/Other: Negative Endo/Other ROS   (+) : arthritis: OA., .          Pt had no PAT visit       Abdominal:   (+) obese,           Vascular: negative vascular ROS.         Other Findings:             Anesthesia Plan      MAC     ASA 3       Induction: intravenous.    MIPS: Postoperative opioids intended and Prophylactic antiemetics administered.  Anesthetic plan and risks discussed with patient.      Plan discussed with CRNA.              Sanjuana Kava, MD   02-15-21      Omar Person, APRN - CRNA

## 2021-02-15 NOTE — Op Note (Signed)
Operative Note      Patient: Lauren Hull  Date of Birth: Oct 27, 1965  MRN: [id number]    Date of Procedure: 02/15/2021    Pre-Op Diagnosis: VARICOSE VEINS Left lower extremity     Post-Op Diagnosis: Same       Procedure(s):  STAB PHLEBECTOMIES LEFT LOWER EXTREMITY    Surgeon(s):  Jinny Blossom, MD    Assistant:   Resident: Danne Harbor, MD    Anesthesia: Monitor Anesthesia Care    Estimated Blood Loss (mL): 50 cc    Complications: None    Specimens:   ID Type Source Tests Collected by Time Destination   A : VARICOSE VEINS Tissue Tissue SURGICAL PATHOLOGY Jinny Blossom, MD 02/15/2021 0940      Findings: 34 total stab incisions.    Detailed Description of Procedure:     Patient was laid supine on the operating room table.  Monitored anesthesia was administered and LMAC was inserted.  SCDs were placed, preop antibiotics given, timeout was performed.  Left lower extremity was prepped and draped in usual sterile fashion.  Prior to operation, skin was marked over sites of varicose veins after patient had been standing for 5 minutes.  Skin was anesthetized using quarter percent Marcaine with epinephrine.  Small stab incisions were made and veins were extracted using a nerve hook and blunt dissection.  A total of 34 stab incisions were made and we were happy with the results of the excision.  Pressure was held and skin knicks that had persistent bleeding were sutured with a 4-0 Monocryl.  Every stab incision was covered with Steri-Strips and 4 x 4 dressing.  Leg was wrapped with Kerlix and Ace wrap.  All counts were correct at the end of the case x2.  Patient tolerated the procedure well was transferred to PACU in stable condition.  Please note that Dr. Jinny Blossom was present and scrubbed throughout all critical portions of this case.      Electronically signed by Joseph Berkshire, MD on 02/15/2021 at 10:33 AM

## 2021-02-15 NOTE — Anesthesia Post-Procedure Evaluation (Signed)
Department of Anesthesiology  Postprocedure Note    Patient: Lauren Hull  MRN: [id number]  Birthdate: [birth year]  Date of evaluation: 02/15/2021      Procedure Summary     Date: 02/15/21 Room / Location: [facility] OR 04 / [facility]    Anesthesia Start: [phone] Anesthesia Stop: 3785    Procedure: STAB PHLEBECTOMIES LEFT LOWER EXTREMITY (Left: Leg Upper) Diagnosis:       Varicose veins of left lower extremity with other complications      (VARICOSE VEINS)    Surgeons: Antony Madura, MD Responsible Provider: Sanjuana Kava, MD    Anesthesia Type: MAC ASA Status: 3          Anesthesia Type: No value filed.    Aldrete Phase I: Aldrete Score: 10    Aldrete Phase II: Aldrete Score: 10      Anesthesia Post Evaluation    Patient location during evaluation: PACU  Patient participation: complete - patient participated  Level of consciousness: awake and alert  Pain score: 0  Airway patency: patent  Nausea & Vomiting: no vomiting and no nausea  Complications: no  Cardiovascular status: hemodynamically stable  Respiratory status: acceptable  Hydration status: euvolemic

## 2021-02-15 NOTE — Progress Notes (Signed)
Patient admitted to [facility] & placed on appropriate monitors. Cart low, locked with siderails up. Call light within reach.  Patient sitting up in bed taking PO. Family called bedside.      Patient's Fiance called & is picking up patient's script in the Pharmacy.

## 2021-02-15 NOTE — Progress Notes (Signed)
Patient given discharge instructions & verbalized understanding.  Fiance present.

## 2021-02-15 NOTE — H&P (Signed)
HISTORY OF PRESENT ILLNESS:                 The patient is a 55 y.o. female who is referred for evaluation of left lower extremity leg discomfort.  She describes some discomfort and discoloration of her left lower extremity.  She went to the emergency room where an ultrasound was done demonstrating no evidence of DVT.  Since that time this issue is resolved.  She was initially taking aspirin however stopped taking aspirin secondary to easy bruising.  She denies any history of blood clots or bleeding disorders.  Her mom does have a history of blood clot and her father has a history of atrial fibrillation.  But otherwise she denies any specific issues.  She denies any other issues associated with this problem.  She has a history of lower extremity what sounds like vein stripping and phlebectomy done in 2003.  Since that time she has developed additional varicose veins consistent with the anterior lateral branches.  She describes some achiness to her lower extremities worse at the end of the day.  Up until this point she has been wearing knee-high compression stockings..     Past Medical History:             Diagnosis Date    Anxiety      Arthritis      Bronchitis      Leg pain      PONV (postoperative nausea and vomiting)       with anesthesia      Past Surgical History:              Procedure Laterality Date    BACK SURGERY   2019    ECTOPIC PREGNANCY SURGERY   1986    JOINT REPLACEMENT Left 06/2019     hip    NERVE BLOCK Left 07/20/2015     transforaminal nerve, lumbar #1    NERVE BLOCK Left 08/17/2015    NERVE BLOCK Left 09/06/2015     lumbar tfnb #3    NERVE BLOCK Left 11/01/2015     medial branch block, lumbar #1    NERVE BLOCK Right 11/15/2015     MBB #2    NERVE BLOCK Left 02/06/2016     lumbar radiofrequency     TUBAL LIGATION        UPPER GASTROINTESTINAL ENDOSCOPY N/A 03/16/2020     EGD BIOPSY performed by Loreli Slot, MD at [facility] ENDOSCOPY    VEIN SURGERY          Current Medications:           Prior  to Admission medications    Medication Sig Start Date End Date Taking? Authorizing Provider   Multiple Vitamins-Minerals (THERAPEUTIC MULTIVITAMIN-MINERALS) tablet Take 1 tablet by mouth daily     Yes Historical Provider, MD   Omeprazole (PRILOSEC PO) Take by mouth daily     Yes Historical Provider, MD   acetaminophen (TYLENOL) 325 MG tablet Take 650 mg by mouth every 6 hours as needed for Pain     Yes Historical Provider, MD   sertraline (ZOLOFT) 50 MG tablet take 1 tablet by mouth once daily 03/06/15   Yes Historical Provider, MD   Elastic Bandages & Supports (JOBST KNEE HIGH COMPRESSION SM) MISC Knee high compression stockings, 20-30 mm/Hg 08/22/20     Jinny Blossom, MD      Allergies:  Ibuprofen     Social History  Socioeconomic History    Marital status: Single       Spouse name: Not on file    Number of children: Not on file    Years of education: Not on file    Highest education level: Not on file   Occupational History    Not on file   Tobacco Use    Smoking status: Current Every Day Smoker       Packs/day: 0.50       Years: 30.00       Pack years: 15.00       Types: Cigarettes    Smokeless tobacco: Never Used   Substance and Sexual Activity    Alcohol use: No    Drug use: No    Sexual activity: Not on file   Other Topics Concern    Not on file   Social History Narrative    Not on file      Social Determinants of Health          Financial Resource Strain:     Difficulty of Paying Living Expenses: Not on file   Food Insecurity:     Worried About Running Out of Food in the Last Year: Not on file    Ran Out of Food in the Last Year: Not on file   Transportation Needs:     Lack of Transportation (Medical): Not on file    Lack of Transportation (Non-Medical): Not on file   Physical Activity:     Days of Exercise per Week: Not on file    Minutes of Exercise per Session: Not on file   Stress:     Feeling of Stress : Not on file   Social Connections:     Frequency of Communication with Friends and Family:  Not on file    Frequency of Social Gatherings with Friends and Family: Not on file    Attends Religious Services: Not on file    Active Member of Clubs or Organizations: Not on file    Attends [employer]er[employer] or Organization Meetings: Not on file    Marital Status: Not on file   Intimate Partner Violence:     Fear of Current or Ex-Partner: Not on file    Emotionally Abused: Not on file    Physically Abused: Not on file    Sexually Abused: Not on file   Housing Stability:     Unable to Pay for Housing in the Last Year: Not on file    Number of Places Lived in the Last Year: Not on file    Unstable Housing in the Last Year: Not on file         History reviewed. No pertinent family history.     REVIEW OF SYSTEMS (New symptoms):     Eyes:                                       Blurred vision:             No [x] Valentino Hue/Yes []                                                   Diplopia:  No [x] Valentino Hue                                                   Vision loss:                  No /Yes    Ears, nose, throat:                Hearing loss:               No Valentino Hue                                                   Vertigo:                        No Valentino Hue                                                   Swallowing problem:   No Valentino Hue                                                   Nose bleeds:               No Valentino Hue                                                   Voice hoarseness:      No Valentino Hue   Respiratory:                           Cough:                        No Valentino Hue                                                   Pleuritic chest pain:     No Valentino Hue                                                   Dyspnea:                     No Valentino Hue   Wheezing:                   No [x] []   Cardiovascular:                     Angina:                        No [x] Valentino Hue []                                                   Palpitations:                 No [x] []                                                   Claudication:               No [x] []                                                   Leg swelling:               No [x] Valentino Hue []   Gastrointestinal:                   Nausea or vomiting:    No [x] []                                                   Abdominal pain:          No [x] []                                                   Intestinal bleeding:      No [x] Valentino Hue []   Musculoskeletal:                   Leg pain:                     No [x] []                                                   Back pain:                   No [x] []                                                   Weakness:  No [x] []   Neurologic:                            Numbness:                  No [x] Valentino Hue []                                                   Paralysis:                    No [x] []                                                   Headaches:                 No [x] []   Hematologic, lymphatic:      Anemia:                       No [x] Valentino Hue []                                                   Bleeding or bruising:   No [x] []                                                   Fevers or chills:           No [x] []   Endocrine:                             Temp intolerance:       No [x] Valentino Hue []                                                   Polydipsia, polyuria:    No [x] []   Skin:                                       Rash:                           No [x] []                                                   Ulcers:                         No [x] Valentino Hue []   Abnorm pigment:        No Valentino Hue   GU:                                          Frequency/urgency:    No Valentino Hue                                                   Hematuria:                  No /Yes                                                   Incontinence:               No /Yes   See HPI  PHYSICAL  EXAM:      Vitals:     08/22/20 0811   BP: 132/84      General Appearance: alert and oriented to person, place and time, well developed and well- nourished, in no acute distress  Skin: warm and dry, no rash or erythema multiple varicose veins consistent with a patent anterior lateral branch.  Left is greater than right there is no palpable cords.  The legs are not swollen there is no edema.  Head: normocephalic and atraumatic  Eyes: extraocular eye movements intact, conjunctivae normal  ENT: external ear and ear canal normal bilaterally, nose without deformity  Pulmonary/Chest: clear to auscultation bilaterally- no wheezes, rales or rhonchi, normal air movement, no respiratory distress  Cardiovascular: normal rate, regular rhythm, normal S1 and S2, no murmurs, no carotid bruits  Abdomen: soft, non-tender, non-distended, normal bowel sounds, no masses or organomegaly  Musculoskeletal: normal range of motion, no joint swelling, deformity or tenderness  Neurologic: no cranial nerve deficit, gait, coordination and speech normal  Extremities: Lateral varicose veins consistent with the anterior lateral branch.  Left greater than right.  No venous stasis disease.  Motor and sensation are intact palpable pulses are noted distally.  There is no evidence of any palpable cords.     RAZIAH FUNNELL   [birth year]   Date of study: 09/07/20       Indication for study:  Leg pain and swelling, varicose veins   Study : Bilateral Lower Extremity Venous Duplex and Reflux Examination       Duplex examination of the common, deep, superficial femoral, and the popliteal veins of the RIGHT lower extremity identifies spontaneous flow.  All scanned veins are compressible and free of echogenic densities.  The right gsv is only visualized to the mid- thigh level. There was no evidence of reflux in the right greater saphenous vein.  The right greater saphenous vein measured 0.8 x 0.8 cm.       Duplex examination of the common, deep, superficial  femoral, and the popliteal veins of the LEFT lower extremity identifies spontaneous flow.  All scanned veins are compressible and free of echogenic densities.  The left greater saphenous vein is not visualized past the 1st branch.  The 1st left greater saphenous vein branch measured 1.1 x 1.1 cm and  there was evidence of reflux lasting 2254 ms.           Additional comments: Negative DVT.   There was no evidence of reflux in the right lesser saphenous vein.  The right lesser saphenous vein measured 0.5 x 0.5 cm.   There was no evidence of reflux in the left lesser saphenous vein.  The left lesser saphenous vein measured 0.5 x 0.5 cm.           Impression: There is no evidence of DVT of either extremity.  There is evidence of reflux of the left greater saphenous vein mainly this is dilated at the level of the first branch measuring 1.1 cm in size with reflux of 2254 ms.  The left greater saphenous vein is not visible beyond the first major branch.  No evidence of reflux in either lesser saphenous vein.       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      Plan will be left lower extremity stab phlebectomy plus or minus ligation.    Risk benefits and alternatives were discussed with the patient include but not limited to bleeding, infection, arteriovenous nerve injury, myocardial infarction, respiratory failure, anesthetic reaction, DVT, pulmonary embolus chronic leg swelling wound complications and or death he understands and wishes to proceed.       Esco Joslyn

## 2021-02-15 NOTE — Discharge Instructions (Signed)
[facility] AMBULATORY PROCEDURE DISCHARGE INSTRUCTIONS  You may be drowsy or lightheaded after receiving sedation or anesthesia.    A responsible person should be with you for the next 24 hours.    Please follow the instructions checked below:    DIET INSTRUCTIONS:  [x] Start with light diet and progress to your normal diet as you feel like eating. If you experience nausea or repeated episodes of vomiting which persist beyond 12-24 hours, notify your doctor.  [] Other     ACTIVITY INSTRUCTIONS:  [x] Rest today. Increase activity as tolerated    [x] Elevate operative limb   [x] No heavy lifting or strenuous activity     [x] No driving for 2 days  [] Other     WOUND/DRESSING INSTRUCTIONS:  Always ensure you and your care giver clean hands before and after caring for the wound.  [x] May shower  - after bandage removed    [x] May bathe - after bandage reomved     [] Derma bond dressing-Do not apply lotion, gel, or liquid to wound while the derma bond is in place.   [x] Other   - Remove ACE bandage and gauze wrap 48 hrs after surgery.  Leave Steri-strips on.  OK to shower/bathe.  Steri-strips will begin to fall off in 4-5 days.      MEDICATION INSTRUCTIONS:    [x] Prescriptions sent with you.  Use as directed.  When taking pain medications, you may experience dizziness or drowsiness.  Do not drink alcohol or drive when taking these medications.  [x] You may take a non-prescription ???headache remedy???, preferably one that does not contain aspirin.      Other Instructions:        FOLLOW-UP CARE:  [x] Call the office at [phone] for follow-up appointment for this or next Tuesday.  Watch for these significant complications.  Call physician if they or any other problems occur:  Fever over 101??    Redness, swelling, hardness or warmth at the operative site  Unrelieved nausea    Foul smelling or cloudy drainage at the operative site   Unrelieved pain    Blood soaked dressing. (Some oozing may be normal)    ,  MD  02/15/2021  10:32 AM

## 2021-02-15 NOTE — Progress Notes (Signed)
CLINICAL PHARMACY NOTE: MEDS TO BEDS    Total # of Prescriptions Filled: 1   The following medications were delivered to the patient:  Oxycodone 5-325mg     Additional Documentation:  Patient picked up in pharmacy 02/15/21

## 2021-02-20 ENCOUNTER — Ambulatory Visit
Admit: 2021-02-20 | Discharge: 2021-02-20 | Payer: BLUE CROSS/BLUE SHIELD | Attending: Vascular Surgery | Primary: Family Medicine

## 2021-02-20 DIAGNOSIS — I83813 Varicose veins of bilateral lower extremities with pain: Secondary | ICD-10-CM

## 2021-02-20 NOTE — Progress Notes (Signed)
02/20/2021    Lauren Hull  1965/09/21    Chief Complaint   Patient presents with    Post-Op Check     Low grade fever, nausea       Patient returns for post operative evaluation status post left great saphenous vein branch stab phlebectomies.  She states she began having nausea and malaise two days ago with a temperature of 99.6 last night. She denies drainage or erythema surrounding the incisions. She has minimal pain and states she has not had to take a pain pill.        Procedure Laterality Date    BACK SURGERY  2019    ECTOPIC PREGNANCY SURGERY  1986    JOINT REPLACEMENT Left 06/2019    hip    NERVE BLOCK Left 07/20/2015    transforaminal nerve, lumbar #1    NERVE BLOCK Left 08/17/2015    NERVE BLOCK Left 09/06/2015    lumbar tfnb #3    NERVE BLOCK Left 11/01/2015    medial branch block, lumbar #1    NERVE BLOCK Right 11/15/2015    MBB #2    NERVE BLOCK Left 02/06/2016    lumbar radiofrequency     TUBAL LIGATION      UPPER GASTROINTESTINAL ENDOSCOPY N/A 03/16/2020    EGD BIOPSY performed by Loreli Slot, MD at [facility] ENDOSCOPY    VEIN SURGERY      VEIN SURGERY Left 02/15/2021    STAB PHLEBECTOMIES LEFT LOWER EXTREMITY performed by Jinny Blossom, MD at [facility] OR       Physical Exam:  The leg incisions are healing without evidence of infection.  No surrounding erythema, drainage or increased warmth. No new swelling.    Assessment:  Post-operative left saphenous vein stab phlebectomy    Problem List Items Addressed This Visit          Circulatory    Varicose veins of both lower extremities with pain - Primary (Chronic)       I reviewed with the patient that normal activities can be resumed as tolerated. I do not feel her general malaise, low grade fever and nausea are related to her recent surgery. I discussed s/s of infection and to call should these occur. We will see her back for her scheduled post op appt to see how she is doing.    Pt seen and plan reviewed with Dr. Alene Mires.     Naoma Diener,  PA-C

## 2021-02-25 DIAGNOSIS — M9903 Segmental and somatic dysfunction of lumbar region: Secondary | ICD-10-CM | POA: Diagnosis not present

## 2021-02-25 DIAGNOSIS — M531 Cervicobrachial syndrome: Secondary | ICD-10-CM | POA: Diagnosis not present

## 2021-02-25 DIAGNOSIS — M9902 Segmental and somatic dysfunction of thoracic region: Secondary | ICD-10-CM | POA: Diagnosis not present

## 2021-02-25 DIAGNOSIS — M9901 Segmental and somatic dysfunction of cervical region: Secondary | ICD-10-CM | POA: Diagnosis not present

## 2021-03-05 NOTE — Telephone Encounter (Signed)
Called to confirm appointment for 03/06/21 at 8 a.m, left message.

## 2021-03-06 ENCOUNTER — Ambulatory Visit
Admit: 2021-03-06 | Discharge: 2021-03-06 | Payer: BLUE CROSS/BLUE SHIELD | Attending: Vascular Surgery | Primary: Family Medicine

## 2021-03-06 ENCOUNTER — Encounter: Payer: BLUE CROSS/BLUE SHIELD | Attending: Vascular Surgery | Primary: Family Medicine

## 2021-03-06 DIAGNOSIS — I83811 Varicose veins of right lower extremities with pain: Secondary | ICD-10-CM

## 2021-03-06 NOTE — Progress Notes (Signed)
03/06/2021    RAYAAN LORAH  [birth year]    Chief Complaint   Patient presents with    Post-Op Check     S/P stab phlebectomies.       Patient returns for post operative evaluation status post left great saphenous vein branch stab phlebectomies.  Her low grade fever and malaise have resolved. She has no pain. States the swelling and heaviness sensation she was having prior to her procedure is gone. She has similar varicosities to the RLE and is interested in intervention.        Procedure Laterality Date    BACK SURGERY  2019    ECTOPIC PREGNANCY SURGERY  1986    JOINT REPLACEMENT Left 06/2019    hip    NERVE BLOCK Left 07/20/2015    transforaminal nerve, lumbar #1    NERVE BLOCK Left 08/17/2015    NERVE BLOCK Left 09/06/2015    lumbar tfnb #3    NERVE BLOCK Left 11/01/2015    medial branch block, lumbar #1    NERVE BLOCK Right 11/15/2015    MBB #2    NERVE BLOCK Left 02/06/2016    lumbar radiofrequency     TUBAL LIGATION      UPPER GASTROINTESTINAL ENDOSCOPY N/A 03/16/2020    EGD BIOPSY performed by Loreli Slot, MD at [facility] ENDOSCOPY    VEIN SURGERY      VEIN SURGERY Left 02/15/2021    STAB PHLEBECTOMIES LEFT LOWER EXTREMITY performed by Jinny Blossom, MD at [facility] OR       Physical Exam:  The leg incisions are healing without evidence of infection.  No surrounding erythema, drainage or increased warmth. No new swelling. RLE with + varicose veins in the anterior-lateral branch distribution w telangiectasias    Assessment:  Post-operative left saphenous vein stab phlebectomy    Problem List Items Addressed This Visit    None    I reviewed with the patient that normal activities can be resumed as tolerated. She is doing very well post-operatively and her symptoms drastically improved. She desires intervention on the RLE. Will schedule. She understands the risks, benefits, alternatives of RLE stab phlebectomy and would like to proceed.      Naoma Diener, PA-C

## 2021-03-13 DIAGNOSIS — G4733 Obstructive sleep apnea (adult) (pediatric): Secondary | ICD-10-CM | POA: Diagnosis not present

## 2021-03-13 DIAGNOSIS — F515 Nightmare disorder: Secondary | ICD-10-CM | POA: Diagnosis not present

## 2021-03-14 NOTE — Progress Notes (Signed)
[facility]   PRE-ADMISSION TESTING GENERAL INSTRUCTIONS  PAT Phone Number: [phone]      GENERAL INSTRUCTIONS:    [x]  Antibacterial Soap Shower Night before and/or AM of surgery.  []  CHG Wipes instruction sheet and wipes given.  []  Hibiclens shower the night before and the morning of surgery.   -Do not use Hibiclens on your face or head.   [x]  Do not wear makeup, lotions, powders, deodorant.   [x]  Nothing by mouth after midnight; including gum, candy, mints, or water.  [x]  You may brush your teeth, gargle, but do NOT swallow water.   [x]  No tobacco products, illegal drugs, or alcohol within 24 hours of your surgery.  [x]  Jewelry or valuables should not be brought to the hospital. All body and/or tongue piercing's must be removed prior to arriving to hospital. No contact lens or hair pins.   [x]  Arrange transportation with a responsible adult companion to and from the hospital. Arrange for someone to be with you for the remainder of the day and for 24 hours after your procedure due to having had anesthesia.          -Who will be your companion for transportation?__Kelsey__         -Who will be staying with you for 24 hrs after your procedure?__Kelsey___  [x]  card and photo ID.  []  Bring copy of living will or healthcare power of attorney papers to be placed in your electronic record.  []  Urine Pregnancy test will be preformed the day of surgery. A specimen sample may be brought from home.  []  Transfusion Bracelet: Please bring with you to hospital, day of surgery.     PARKING INSTRUCTIONS:     [x]  ARRIVAL DATE & TIME: 10/6  @  1030  [x]  Enter into the . Two people may accompany you. Masks are required.   [x]  Parking Lot "I" is where you will park. It is located on the corner of and . The entrance is on [facility].   Upon entering the parking lot, a voucher ticket will print    EDUCATION INSTRUCTIONS:        []  Knee or Hip  replacement booklet & exercise pamphlets given.     []  [facility] Pamphlet placed in chart.   []  Pre-admission Testing educational folder given  []  Incentive Spirometry,coughing & deep breathing exercises reviewed.     []  Medication information sheet(s)   []  Fluoroscopy-Xray used in surgery reviewed with patient. Educational pamphlet placed in chart.  []  Pain: Post-op pain is normal and to be expected. You will be asked to rate your pain from 0-10.  []  Joint camp offered.   []  Joint replacement booklets given.  []  Spine Navigator to see in PAT.  []  Other:___________________________    MEDICATION INSTRUCTIONS:    [x]  Bring a complete list of your medications, please write the last time you took the medicine, give this list to the nurse in Pre-Op.  [x]  Take only the following medications the morning of surgery with 1-2 ounces of water: tylenol, zoloft, prilosec  [x]  Stop all herbal supplements and vitamins 5 days before surgery. Stop NSAIDS 7 days before surgery.  []  DO NOT take any diabetic medicine the morning of surgery.  Follow instructions for insulin the day before surgery.  []  If you are diabetic and your blood sugar is low or you feel symptomatic, you may drink 1-2 ounces of apple juice or  take a glucose tablet.            -The morning of your procedure, you may call the pre-op area if you have concerns about your blood sugar [phone].  '[]'$  Use your inhalers the morning of surgery.  Bring your emergency inhaler with you day of surgery.  '[x]'$  Follow physician instructions regarding any blood thinners you may be taking.    WHAT TO EXPECT:    '[x]'$  The day of surgery you will be greeted and checked in by the [employer].  In addition, you will be registered in the [place] by a Patient Barista. Please bring your photo ID and insurance card. A nurse will greet you in accordance to the time you are needed in the pre-op area to prepare you for surgery. Please  do not be discouraged if you are not greeted in the order you arrive as there are many variables that are involved in patient preparation.  Your patience is greatly appreciated as you wait for your nurse.  Please bring in items such as: books, magazines, newspapers, electronics, or any other items  to occupy your time in the waiting area.    '[x]'$   Delays may occur with surgery and staff will make a sincere effort to keep you informed of delays.  If any delays occur with your procedure, we apologize ahead of time for your inconvenience as we recognize the value of your time.

## 2021-03-21 ENCOUNTER — Inpatient Hospital Stay: Payer: BLUE CROSS/BLUE SHIELD

## 2021-03-21 LAB — CBC
Hematocrit: 36.1 % (ref 34.0–48.0)
Hemoglobin: 11.5 g/dL (ref 11.5–15.5)
MCH: 29.1 pg (ref 26.0–35.0)
MCHC: 31.9 % — ABNORMAL LOW (ref 32.0–34.5)
MCV: 91.4 fL (ref 80.0–99.9)
MPV: 10.3 fL (ref 7.0–12.0)
Platelets: 298 E9/L (ref 130–450)
RBC: 3.95 E12/L (ref 3.50–5.50)
RDW: 14 fL (ref 11.5–15.0)
WBC: 7.3 E9/L (ref 4.5–11.5)

## 2021-03-21 LAB — BASIC METABOLIC PANEL W/ REFLEX TO MG FOR LOW K
Anion Gap: 11 mmol/L (ref 7–16)
BUN: 14 mg/dL (ref 6–20)
CO2: 22 mmol/L (ref 22–29)
Calcium: 9.8 mg/dL (ref 8.6–10.2)
Chloride: 109 mmol/L — ABNORMAL HIGH (ref 98–107)
Creatinine: 1 mg/dL (ref 0.5–1.0)
GFR African American: >60
GFR Non-African American: 57 mL/min/{1.73_m2} (ref >=60)
Glucose: 91 mg/dL (ref 74–99)
Potassium reflex Magnesium: 4.6 mmol/L (ref 3.5–5.0)
Sodium: 142 mmol/L (ref 132–146)

## 2021-03-21 MED ORDER — SODIUM CHLORIDE 0.9 % IV SOLN
0.9 % | INTRAVENOUS | Status: DC | PRN
Start: 2021-03-21 — End: 2021-03-21

## 2021-03-21 MED ORDER — FENTANYL CITRATE (PF) 100 MCG/2ML IJ SOLN
100 MCG/2ML | INTRAMUSCULAR | Status: DC | PRN
Start: 2021-03-21 — End: 2021-03-21
  Administered 2021-03-21 (×3): 50 via INTRAVENOUS

## 2021-03-21 MED ORDER — SODIUM CHLORIDE 0.9 % IR SOLN
0.9 % | Status: AC | PRN
Start: 2021-03-21 — End: 2021-03-21
  Administered 2021-03-21: 19:00:00 1000

## 2021-03-21 MED ORDER — LIDOCAINE HCL 1 % IJ SOLN
1 % | INTRAMUSCULAR | Status: AC
Start: 2021-03-21 — End: ?

## 2021-03-21 MED ORDER — BUPIVACAINE HCL (PF) 0.25 % IJ SOLN
0.25 % | INTRAMUSCULAR | Status: AC
Start: 2021-03-21 — End: ?

## 2021-03-21 MED ORDER — SODIUM CHLORIDE 0.9 % IV SOLN
0.9 % | INTRAVENOUS | Status: DC | PRN
Start: 2021-03-21 — End: 2021-03-21
  Administered 2021-03-21 (×2): via INTRAVENOUS

## 2021-03-21 MED ORDER — FENTANYL CITRATE (PF) 100 MCG/2ML IJ SOLN
100 MCG/2ML | INTRAMUSCULAR | Status: AC
Start: 2021-03-21 — End: ?

## 2021-03-21 MED ORDER — NORMAL SALINE FLUSH 0.9 % IV SOLN
0.9 % | Freq: Two times a day (BID) | INTRAVENOUS | Status: DC
Start: 2021-03-21 — End: 2021-03-21

## 2021-03-21 MED ORDER — GLYCOPYRROLATE 1 MG/5ML IJ SOLN
1 MG/5ML | INTRAMUSCULAR | Status: AC
Start: 2021-03-21 — End: ?

## 2021-03-21 MED ORDER — SODIUM CHLORIDE 0.9 % IV SOLN
0.9 % | INTRAVENOUS | Status: DC
Start: 2021-03-21 — End: 2021-03-21
  Administered 2021-03-21: 15:00:00 via INTRAVENOUS

## 2021-03-21 MED ORDER — PROPOFOL 500 MG/50ML IV EMUL
500 MG/50ML | INTRAVENOUS | Status: AC
Start: 2021-03-21 — End: ?

## 2021-03-21 MED ORDER — MIDAZOLAM HCL 2 MG/2ML IJ SOLN
2 MG/ML | INTRAMUSCULAR | Status: DC | PRN
Start: 2021-03-21 — End: 2021-03-21
  Administered 2021-03-21: 18:00:00 2 via INTRAVENOUS

## 2021-03-21 MED ORDER — CEFAZOLIN SODIUM 2 G IJ SOLR
2 g | INTRAMUSCULAR | Status: AC
Start: 2021-03-21 — End: 2021-03-21
  Administered 2021-03-21: 18:00:00 2000 mg via INTRAVENOUS

## 2021-03-21 MED ORDER — NORMAL SALINE FLUSH 0.9 % IV SOLN
0.9 % | INTRAVENOUS | Status: DC | PRN
Start: 2021-03-21 — End: 2021-03-21

## 2021-03-21 MED ORDER — KETAMINE HCL 50 MG/5ML IJ SOSY
50 MG/5ML | INTRAMUSCULAR | Status: DC | PRN
Start: 2021-03-21 — End: 2021-03-21
  Administered 2021-03-21: 19:00:00 50 via INTRAVENOUS

## 2021-03-21 MED ORDER — GLYCOPYRROLATE 1 MG/5ML IJ SOLN
1 MG/5ML | INTRAMUSCULAR | Status: DC | PRN
Start: 2021-03-21 — End: 2021-03-21
  Administered 2021-03-21: 18:00:00 .2 via INTRAVENOUS

## 2021-03-21 MED ORDER — KETAMINE HCL 50 MG/5ML IJ SOSY
50 MG/5ML | INTRAMUSCULAR | Status: AC
Start: 2021-03-21 — End: ?

## 2021-03-21 MED ORDER — PROPOFOL 200 MG/20ML IV EMUL
200 MG/20ML | INTRAVENOUS | Status: AC
Start: 2021-03-21 — End: ?

## 2021-03-21 MED ORDER — LIDOCAINE HCL 1% INJ (MIXTURES ONLY)
1 % | INTRAMUSCULAR | Status: DC | PRN
Start: 2021-03-21 — End: 2021-03-21
  Administered 2021-03-21: 19:00:00 40

## 2021-03-21 MED ORDER — PROPOFOL 500 MG/50ML IV EMUL
50050 MG/50ML | INTRAVENOUS | Status: DC | PRN
Start: 2021-03-21 — End: 2021-03-21
  Administered 2021-03-21: 18:00:00 40 via INTRAVENOUS
  Administered 2021-03-21: 18:00:00 100 via INTRAVENOUS

## 2021-03-21 MED ORDER — MIDAZOLAM HCL 2 MG/2ML IJ SOLN
2 MG/ML | INTRAMUSCULAR | Status: AC
Start: 2021-03-21 — End: ?

## 2021-03-21 MED FILL — KETAMINE HCL 50 MG/5ML IJ SOSY: 50 MG/5ML | INTRAMUSCULAR | Qty: 5

## 2021-03-21 MED FILL — GLYCOPYRROLATE 1 MG/5ML IJ SOLN: 1 MG/5ML | INTRAMUSCULAR | Qty: 5

## 2021-03-21 MED FILL — FENTANYL CITRATE (PF) 100 MCG/2ML IJ SOLN: 100 MCG/2ML | INTRAMUSCULAR | Qty: 2

## 2021-03-21 MED FILL — PROPOFOL 500 MG/50ML IV EMUL: 500 MG/50ML | INTRAVENOUS | Qty: 50

## 2021-03-21 MED FILL — XYLOCAINE 1 % IJ SOLN: 1 % | INTRAMUSCULAR | Qty: 20

## 2021-03-21 MED FILL — CEFAZOLIN SODIUM 2 G IJ SOLR: 2 g | INTRAMUSCULAR | Qty: 2000

## 2021-03-21 MED FILL — SENSORCAINE-MPF 0.25 % IJ SOLN: 0.25 % | INTRAMUSCULAR | Qty: 20

## 2021-03-21 MED FILL — MIDAZOLAM HCL 2 MG/2ML IJ SOLN: 2 MG/ML | INTRAMUSCULAR | Qty: 2

## 2021-03-21 MED FILL — PROPOFOL 200 MG/20ML IV EMUL: 200 MG/20ML | INTRAVENOUS | Qty: 20

## 2021-03-21 NOTE — Anesthesia Post-Procedure Evaluation (Signed)
Department of Anesthesiology  Postprocedure Note    Patient: Lauren Hull  MRN: [id number]  Birthdate: 08/08/1965  Date of evaluation: 03/21/2021      Procedure Summary     Date: 03/21/21 Room / Location: [facility] OR 04 / [facility]    Anesthesia Start: 4403 Anesthesia Stop: 4742    Procedure: RIGHT LOWER EXTREMITY STAB PHLEBECTOMY (Right: Thigh) Diagnosis:       Varicose veins of leg with edema, right      (Varicose veins of leg with edema)    Surgeons: Antony Madura, MD Responsible Provider: Ivette Loyal, MD    Anesthesia Type: MAC ASA Status: 3          Anesthesia Type: No value filed.    Aldrete Phase I: Aldrete Score: 10    Aldrete Phase II:        Anesthesia Post Evaluation    Patient location during evaluation: bedside  Patient participation: complete - patient cannot participate  Level of consciousness: awake and alert  Airway patency: patent  Nausea & Vomiting: no nausea and no vomiting  Complications: no  Cardiovascular status: blood pressure returned to baseline  Respiratory status: acceptable  Hydration status: euvolemic  Multimodal analgesia pain management approach

## 2021-03-21 NOTE — H&P (Signed)
Vascular Surgery History & Physical Exam      Chief Complaint: Symptomatic varicose veins    HISTORY OF PRESENT ILLNESS:                The patient is a 55 y.o. female who presents to the hospital for elective treatment of symptomatic varicose veins of the right lower extremity.  The patient denies any problems since last seen in the office.    IMPRESSION:  Symptomatic varicose veins of the right lower extremity.  Active Hospital Problems    Diagnosis     Varicose veins of right lower extremity with pain [I83.811]        PLAN:  Right lower extremity stab phlebectomies of varicose branches.    I reviewed the procedure with the patient.  I discussed the risks, benefits, and alternatives of the procedure.  The patient understands and consents.  All questions were answered.    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.061    Osteoarthritis M19.90    RUQ pain R10.11    Varicose veins of right lower extremity with pain I83.811       Past Medical History:   Diagnosis Date    Anxiety     Arthritis     Bronchitis     Leg pain     PONV (postoperative nausea and vomiting)     with anesthesia        Past Surgical History:   Procedure Laterality Date    BACK SURGERY  2019    ECTOPIC PREGNANCY SURGERY  1986    JOINT REPLACEMENT Left 06/2019    hip    NERVE BLOCK Left 07/20/2015    transforaminal nerve, lumbar #1    NERVE BLOCK Left 08/17/2015    NERVE BLOCK Left 09/06/2015    lumbar tfnb #3    NERVE BLOCK Left 11/01/2015    medial branch block, lumbar #1    NERVE BLOCK Right 11/15/2015    MBB #2    NERVE BLOCK Left 02/06/2016    lumbar radiofrequency     TUBAL LIGATION      UPPER GASTROINTESTINAL  ENDOSCOPY N/A 03/16/2020    EGD BIOPSY performed by Loreli Slot, MD at [facility] ENDOSCOPY    VEIN SURGERY      VEIN SURGERY Left 02/15/2021    STAB PHLEBECTOMIES LEFT LOWER EXTREMITY performed by Jinny Blossom, MD at [facility] OR       Current Medications:     Current Facility-Administered Medications:     0.9 % sodium chloride infusion, , IntraVENous, Continuous, Kaitlyn E Buxman, PA-C, Last Rate: 125 mL/hr at 03/21/21 1106, New Bag at 03/21/21 1106    sodium chloride flush 0.9 % injection 5-40 mL, 5-40 mL, IntraVENous, 2 times per day, Kaitlyn E Buxman, PA-C    sodium chloride flush 0.9 % injection 5-40 mL, 5-40 mL, IntraVENous, PRN, Kaitlyn E Buxman, PA-C    0.9 % sodium chloride infusion, , IntraVENous, PRN, Kaitlyn E Buxman, PA-C    ceFAZolin (ANCEF) 2,000 mg in sterile water 20 mL IV syringe, 2,000 mg, IntraVENous, On Call to OR,  Kaitlyn E Buxman, PA-C    Allergies:  Ibuprofen    Social History     Socioeconomic History    Marital status: Single     Spouse name: Not on file    Number of children: Not on file    Years of education: Not on file    Highest education level: Not on file   Occupational History    Not on file   Tobacco Use    Smoking status: Every Day     Packs/day: 0.50     Years: 30.00     Pack years: 15.00     Types: Cigarettes    Smokeless tobacco: Never   Vaping Use    Vaping Use: Never used   Substance and Sexual Activity    Alcohol use: No    Drug use: No    Sexual activity: Not on file   Other Topics Concern    Not on file   Social History Narrative    Not on file     Social Determinants of Health     Financial Resource Strain: Not on file   Food Insecurity: Not on file   Transportation Needs: Not on file   Physical Activity: Not on file   Stress: Not on file   Social Connections: Not on file   Intimate Partner Violence: Not on file   Housing Stability: Not on file        History reviewed. No pertinent family history.    REVIEW OF SYSTEMS:  The chart was reviewed.    PHYSICAL EXAM:    Vitals:     03/21/21 1046   BP: (!) 125/58   Pulse: 70   Resp: 20   Temp: 97.9 ??F (36.6 ??C)   SpO2: 95%     CONSTITUTIONAL:  awake, alert, cooperative, no apparent distress, and appears stated age  NECK:  supple, symmetrical, trachea midline  LUNGS:  no increased work of breathing, good air exchange  CARDIOVASCULAR:  regular rate and rhythm   ABDOMEN:  soft, non-distended and non-tender  EXTREMITIES: right leg varicose vein branches marked.    LABS:    Lab Results   Component Value Date    WBC 7.3 03/21/2021    HGB 11.5 03/21/2021    HCT 36.1 03/21/2021    PLT 298 03/21/2021    PROTIME 10.9 02/15/2021    INR 1.0 02/15/2021    K 4.6 03/21/2021    BUN 14 03/21/2021    CREATININE 1.0 03/21/2021       RADIOLOGY:

## 2021-03-21 NOTE — Anesthesia Pre-Procedure Evaluation (Signed)
Department of Anesthesiology  Preprocedure Note       Name:  Lauren Hull   Age:  55 y.o.  DOB:  10-31-1965                                          MRN:  [id number]         Date:  03/21/2021      Surgeon: Moishe Spice):  Jinny Blossom, MD    Procedure: Procedure(s):  RIGHT LOWER EXTREMITY STAB PHLEBECTOMY    Medications prior to admission:   Prior to Admission medications    Medication Sig Start Date End Date Taking? Authorizing Provider   Multiple Vitamins-Minerals (THERAPEUTIC MULTIVITAMIN-MINERALS) tablet Take 1 tablet by mouth daily    Historical Provider, MD   Elastic Bandages & Supports (JOBST KNEE HIGH COMPRESSION SM) MISC Knee high compression stockings, 20-30 mm/Hg  Patient not taking: Reported on 03/21/2021 08/22/20   Jinny Blossom, MD   Omeprazole (PRILOSEC PO) Take by mouth daily    Historical Provider, MD   acetaminophen (TYLENOL) 325 MG tablet Take 650 mg by mouth every 6 hours as needed for Pain    Historical Provider, MD   sertraline (ZOLOFT) 50 MG tablet take 1 tablet by mouth once daily 03/06/15   Historical Provider, MD       Current medications:    No current facility-administered medications for this visit.     No current outpatient medications on file.     Facility-Administered Medications Ordered in Other Visits   Medication Dose Route Frequency Provider Last Rate Last Admin   . 0.9 % sodium chloride infusion   IntraVENous Continuous Nani Ravens, PA-C 125 mL/hr at 03/21/21 1106 New Bag at 03/21/21 1106   . sodium chloride flush 0.9 % injection 5-40 mL  5-40 mL IntraVENous 2 times per day Kaitlyn E Buxman, PA-C       . sodium chloride flush 0.9 % injection 5-40 mL  5-40 mL IntraVENous PRN Kaitlyn E Buxman, PA-C       . 0.9 % sodium chloride infusion   IntraVENous PRN Kaitlyn E Buxman, PA-C       . ceFAZolin (ANCEF) 2,000 mg in sterile water 20 mL IV syringe  2,000 mg IntraVENous On Call to OR Kaitlyn E Buxman, PA-C           Allergies:    Allergies   Allergen Reactions   . Ibuprofen  Shortness Of Breath and Itching       Problem List:    Patient Active Problem List   Diagnosis Code   . DDD (degenerative disc disease), lumbar M51.36   . Lumbar spinal stenosis M48.061   . Facet arthropathy, lumbar  M47.816   . Chronic bilateral low back pain with left-sided sciatica M54.42, G89.29   . Lumbar radiculopathy M54.16   . Chronic pain G89.29   . Disc displacement, lumbar M51.26   . Neural foraminal stenosis of lumbar spine M48.061   . Osteoarthritis M19.90   . RUQ pain R10.11   . Varicose veins of right lower extremity with pain I83.811       Past Medical History:        Diagnosis Date   . Anxiety    . Arthritis    . Bronchitis    . Leg pain    . PONV (postoperative nausea and vomiting)     with  anesthesia       Past Surgical History:        Procedure Laterality Date   . BACK SURGERY  2019   . ECTOPIC PREGNANCY SURGERY  1986   . JOINT REPLACEMENT Left 06/2019    hip   . NERVE BLOCK Left 07/20/2015    transforaminal nerve, lumbar #1   . NERVE BLOCK Left 08/17/2015   . NERVE BLOCK Left 09/06/2015    lumbar tfnb #3   . NERVE BLOCK Left 11/01/2015    medial branch block, lumbar #1   . NERVE BLOCK Right 11/15/2015    MBB #2   . NERVE BLOCK Left 02/06/2016    lumbar radiofrequency    . TUBAL LIGATION     . UPPER GASTROINTESTINAL ENDOSCOPY N/A 03/16/2020    EGD BIOPSY performed by Loreli Slot, MD at [facility] ENDOSCOPY   . VEIN SURGERY     . VEIN SURGERY Left 02/15/2021    STAB PHLEBECTOMIES LEFT LOWER EXTREMITY performed by Jinny Blossom, MD at [facility] OR       Social History:    Social History     Tobacco Use   . Smoking status: Every Day     Packs/day: 0.50     Years: 30.00     Pack years: 15.00     Types: Cigarettes   . Smokeless tobacco: Never   Substance Use Topics   . Alcohol use: No                                Ready to quit: Not Answered  Counseling given: Not Answered      Vital Signs (Current):   There were no vitals filed for this visit.                                           BP Readings from  Last 3 Encounters:   03/21/21 (!) 125/58   02/15/21 119/73   01/02/21 110/70       NPO Status:  > 8 hours                                                                               BMI:   Wt Readings from Last 3 Encounters:   03/21/21 202 lb (91.6 kg)   02/20/21 202 lb (91.6 kg)   02/15/21 202 lb (91.6 kg)     There is no height or weight on file to calculate BMI.    CBC:   Lab Results   Component Value Date/Time    WBC 7.3 03/21/2021 11:00 AM    RBC 3.95 03/21/2021 11:00 AM    HGB 11.5 03/21/2021 11:00 AM    HCT 36.1 03/21/2021 11:00 AM    MCV 91.4 03/21/2021 11:00 AM    RDW 14.0 03/21/2021 11:00 AM    PLT 298 03/21/2021 11:00 AM       CMP:   Lab Results   Component Value Date/Time    NA 142 03/21/2021 11:00 AM    K  4.6 03/21/2021 11:00 AM    CL 109 03/21/2021 11:00 AM    CO2 22 03/21/2021 11:00 AM    BUN 14 03/21/2021 11:00 AM    CREATININE 1.0 03/21/2021 11:00 AM    GFRAA >60 03/21/2021 11:00 AM    LABGLOM 57 03/21/2021 11:00 AM    GLUCOSE 91 03/21/2021 11:00 AM    PROT 7.2 07/31/2020 08:05 PM    CALCIUM 9.8 03/21/2021 11:00 AM    BILITOT <0.2 07/31/2020 08:05 PM    ALKPHOS 72 07/31/2020 08:05 PM    AST 15 07/31/2020 08:05 PM    ALT 15 07/31/2020 08:05 PM       POC Tests: No results for input(s): POCGLU, POCNA, POCK, POCCL, POCBUN, POCHEMO, POCHCT in the last 72 hours.    Coags:   Lab Results   Component Value Date/Time    PROTIME 10.9 02/15/2021 08:10 AM    INR 1.0 02/15/2021 08:10 AM       HCG (If Applicable): No results found for: PREGTESTUR, PREGSERUM, HCG, HCGQUANT     ABGs: No results found for: PHART, PO2ART, PCO2ART, HCO3ART, BEART, O2SATART     Type & Screen (If Applicable):  No results found for: LABABO, LABRH    Drug/Infectious Status (If Applicable):  No results found for: HIV, HEPCAB    COVID-19 Screening (If Applicable):   Lab Results   Component Value Date/Time    COVID19 Not Detected 03/21/2020 12:11 AM    COVID19 Not Detected 03/13/2020 08:14 AM           Anesthesia Evaluation  Patient summary  reviewed and Nursing notes reviewed   history of anesthetic complications: PONV.  Airway: Mallampati: III  TM distance: >3 FB   Neck ROM: full  Mouth opening: > = 3 FB   Dental:    (+) caps  Comment: Dentition intact, missing multiple molars, denies any loose teeth.    Pulmonary: breath sounds clear to auscultation  (+) current smoker ( Every Day - 0.5-1.5 PPD x 40 years )    (-) COPD  Sleep apnea:            Patient did not smoke on day of surgery.                 Cardiovascular:Negative CV ROS  Exercise tolerance: good (>4 METS),   (+) hyperlipidemia      ECG reviewed  Rhythm: regular             Beta Blocker:  Not on Beta Blocker         Neuro/Psych:   (+) neuromuscular disease ( Lumbar spinal stenosis):, psychiatric history:depression/anxiety  (on Zoloft)            GI/Hepatic/Renal:   (+) GERD: well controlled, morbid obesity          Endo/Other: Negative Endo/Other ROS   (+) : arthritis: OA., .          Pt had no PAT visit       Abdominal:   (+) obese,           Vascular: negative vascular ROS.         Other Findings:             Anesthesia Plan      MAC     ASA 3       Induction: intravenous.    MIPS: Postoperative opioids intended and Prophylactic antiemetics administered.  Anesthetic plan and risks discussed with patient.  Use of blood products discussed with patient whom consented to blood products.   Plan discussed with CRNA and attending.              Carrolyn Meiers, RN   02-15-21      Carrolyn Meiers, RN

## 2021-03-21 NOTE — Op Note (Signed)
Operative Note      Patient: Lauren Hull  Date of Birth: 17-Feb-1966  MRN: [id number]    DATE OF PROCEDURE: 03/21/2021     SURGEON: Darlina Rumpf M.D.     ASSISTANT: Dr. Debbrah Alar     PREOPERATIVE DIAGNOSIS: Symptomatic varicose veins of the right lower extremity.     POSTOPERATIVE DIAGNOSIS: Symptomatic varicose veins of the right lower extremity.    OPERATION: Stab phlebectomies of varicose branches x22 incisions     ANESTHESIA: LMAC.    Findings: Large disease varicose veins that were very friable    ESTIMATED BLOOD LOSS: less than 100      COMPLICATIONS: None     DESCRIPTION OF PROCEDURE: The patient was identified and the procedure was confirmed. The right leg was prepped and draped in the usual sterile fashion.     Next, an 11-blade was used to make small incisions over varicose branches. The branches were avulsed with mosquito clamps and pressure was held for hemostasis. This was performed for 22 areas in the anterior thigh lateral thigh anterior leg and lateral leg. Steri-Strip were applied to the incisions.  Vertical mattress nylon sutures were applied where needed consisting of 3-0 Monocryl sutures.  Sterile gauze and Ace wrap were applied to the leg in the operating room. Needle, sponge, and instrument counts were reported as correct x2. The patient tolerated the procedure and was transferred to the recovery area in satisfactory condition.       Specimens:   ID Type Source Tests Collected by Time Destination   A : A. VEIN RIGHT LEG Tissue Tissue SURGICAL PATHOLOGY Jinny Blossom, MD 03/21/2021 1451        Implants:  * No implants in log *      Drains: * No LDAs found *      Electronically signed by Jinny Blossom, MD on 03/21/2021 at 2:56 PM

## 2021-03-21 NOTE — Discharge Instructions (Signed)
[facility] AMBULATORY PROCEDURE DISCHARGE INSTRUCTIONS  You may be drowsy or lightheaded after receiving sedation or anesthesia.    A responsible person should be with you for the next 24 hours.    Please follow the instructions checked below:    DIET INSTRUCTIONS:  [x] Start with light diet and progress to your normal diet as you feel like eating. If you experience nausea or repeated episodes of vomiting which persist beyond 12-24 hours, notify your doctor.  [] Other     ACTIVITY INSTRUCTIONS:  [x] Rest today. Increase activity as tolerated    [x] Elevate operative limb   [x] No heavy lifting or strenuous activity     [x] No driving for 2 days  [] Other     WOUND/DRESSING INSTRUCTIONS:  Always ensure you and your care giver clean hands before and after caring for the wound.  [x] May shower  - after bandage removed    [x] May bathe - after bandage reomved     [] Derma bond dressing-Do not apply lotion, gel, or liquid to wound while the derma bond is in place.   [x] Other   - Remove ACE bandage and gauze wrap 48 hrs after surgery.  Leave Steri-strips on.  OK to shower/bathe.  Steri-strips will begin to fall off in 4-5 days.      MEDICATION INSTRUCTIONS:    [x] Prescriptions sent with you.  Use as directed.  When taking pain medications, you may experience dizziness or drowsiness.  Do not drink alcohol or drive when taking these medications.  [x] You may take a non-prescription ???headache remedy???, preferably one that does not contain aspirin.      Other Instructions:        FOLLOW-UP CARE:  [x] Call the office at [phone] for follow-up appointment.  Watch for these significant complications.  Call physician if they or any other problems occur:  Fever over 101??    Redness, swelling, hardness or warmth at the operative site  Unrelieved nausea    Foul smelling or cloudy drainage at the operative site   Unrelieved pain    Blood soaked dressing. (Some oozing may be normal)    , MD  03/21/2021  3:00 PM

## 2021-03-22 NOTE — Telephone Encounter (Signed)
Left message with follow up appointment.  04-03-21 at 10:15 am.

## 2021-03-25 DIAGNOSIS — F419 Anxiety disorder, unspecified: Secondary | ICD-10-CM | POA: Diagnosis not present

## 2021-03-25 DIAGNOSIS — F319 Bipolar disorder, unspecified: Secondary | ICD-10-CM | POA: Diagnosis not present

## 2021-03-25 DIAGNOSIS — E039 Hypothyroidism, unspecified: Secondary | ICD-10-CM | POA: Diagnosis not present

## 2021-03-26 ENCOUNTER — Encounter: Payer: Self-pay | Admitting: *Deleted

## 2021-03-27 ENCOUNTER — Encounter: Payer: Self-pay | Admitting: Neurology

## 2021-03-27 ENCOUNTER — Ambulatory Visit: Payer: Federal, State, Local not specified - PPO | Admitting: Neurology

## 2021-03-27 VITALS — BP 109/73 | HR 78 | Ht 65.0 in | Wt 156.2 lb

## 2021-03-27 DIAGNOSIS — R4189 Other symptoms and signs involving cognitive functions and awareness: Secondary | ICD-10-CM | POA: Diagnosis not present

## 2021-03-27 DIAGNOSIS — R479 Unspecified speech disturbances: Secondary | ICD-10-CM | POA: Diagnosis not present

## 2021-03-27 NOTE — Patient Instructions (Addendum)
Cognitive therapy - speech therapists Formal memory testing for learning disorder/add inattentive type - if cognitive therapy does not help please discuss with primary care referring you someplace to test for adhd or otherwise  Memory Compensation Strategies  Use "WARM" strategy.  W= write it down  A= associate it  R= repeat it  M= make a mental note  2.   You can keep a Social worker.  Use a 3-ring notebook with sections for the following: calendar, important names and phone numbers,  medications, doctors' names/phone numbers, lists/reminders, and a section to journal what you did  each day.   3.    Use a calendar to write appointments down.  4.    Write yourself a schedule for the day.  This can be placed on the calendar or in a separate section of the Memory Notebook.  Keeping a  regular schedule can help memory.  5.    Use medication organizer with sections for each day or morning/evening pills.  You may need help loading it  6.    Keep a basket, or pegboard by the door.  Place items that you need to take out with you in the basket or on the pegboard.  You may also want to  include a message board for reminders.  7.    Use sticky notes.  Place sticky notes with reminders in a place where the task is performed.  For example: " turn off the  stove" placed by the stove, "lock the door" placed on the door at eye level, " take your medications" on  the bathroom mirror or by the place where you normally take your medications.  8.    Use alarms/timers.  Use while cooking to remind yourself to check on food or as a reminder to take your medicine, or as a  reminder to make a call, or as a reminder to perform another task, etc.

## 2021-03-27 NOTE — Progress Notes (Signed)
[facility]    Provider:  Dr Jaynee Eagles Requesting Provider: Ramiro Harvest, PA-C Primary Care Provider:  Ramiro Harvest, PA-C  CC:  memory loss  HPI:  Alyssa Holmes is a 55 y.o. female here as requested by Ramiro Harvest, PA-C for pituitary adenoma and headaches and memory loss. Today we will focus on the memory changes which bothr her the most, very difficult to address multiple things at one appointment and patient agrees.  She has a past medical history of pancreatic insufficiency, PCOS, hypothyroidism, pituitary adenoma, bipolar disorder, hyperlipidemia, chronic pancreatitis and epigastric abdominal pain, anxiety, dream enactment disorder, obstructive sleep apnea using CPAP very effectively, steatosis of liver and unspecified mood disorders.  Patient has been on antidepressants and antipsychotic medications for many years but her bipolar disorder is stable per review of notes.  Her dream enactment has improved on CPAP.  Memory changes started a long time ago, 10 years ago, a little bit frustrating and worse, forgetting a lot of common things, it is frustrating, slowly progressive, she can't remember short term or long term, she forgot something they did a year ago, if someone reminds her of the incident it'll come back to her, she does a lot of people ending her sentences, she has to search her memory for her name, she was diagnosed with dyslexia, it is affecting her job wise, if she doesn't have a pen in her hand she can't remember their names, simple things she should know at work she doesn't, she may slur her words or stops mid sentence. She has always had dream enactment, working with a cpap, having difficulty getting the words out, stumbling on her words, she will "lose it",  she makes notes and then forgets. Her son has adhd. No other focal neurologic deficits, associated symptoms, inciting events or modifiable factors.  Reviewed notes, labs and imaging  from outside physicians, which showed: I reviewed notes from Ammie Dalton, patient has concerns for memory loss but she scored a 20 out of 30 on her MoCA, symptoms may be consistent with mild mild cognitive impairment, in the past MRI showed punctate white matter lesions and she has a history of pituitary adenoma, last MRI from 2017 showed no obvious pituitary adenoma but she was sent to neurology with new onset headaches, memory concerns, vision changes.  Memory loss has been ongoing for a while, family members children and husband have voiced concerns, mostly short-term.  Difficulty with faces and names, performing simple tasks, word retrieval and difficulty forming full sentences at times, impacting her work and daily life, some dizziness and lightheadedness associated unsteadiness, also having some headaches with blurred vision, she saw an eye doctor which showed no abnormalities.  No family history of Alzheimer's or dementia, she denies any new life stressors or major life changes.  Labs reviewed include those collected November 21, 2020 CBC showed some mild anemia hemoglobin 12.4 and hematocrit 36.6 otherwise unremarkable, CMP normal with BUN 16 and creatinine 0.85.  MRI brain 01/16/2021:   EXAM: MRI HEAD WITHOUT AND WITH CONTRAST   TECHNIQUE: Multiplanar, multiecho pulse sequences of the brain and surrounding structures were obtained without and with intravenous contrast.   CONTRAST:  42mL GADAVIST GADOBUTROL 1 MMOL/ML IV SOLN   COMPARISON:  05/31/2016   FINDINGS: Brain: Normal size and shape pituitary gland with homogeneous enhancement. No infarct, hemorrhage, hydrocephalus, or collection. Small remote cerebral white matter insults without progression from 2017, usually related to prior ischemia, infection, demyelination, or trauma.  Vascular: Normal flow voids and vessel enhancement   Skull and upper cervical spine: Normal marrow signal   Sinuses/Orbits: Negative.  No  sinusitis.  IMPRESSION: 1. No explanation for headache. 2. Normal MRI of the pituitary gland. 3. Small remote white matter insults which remain nonspecific and non progressed from 2017.   Review of Systems: Patient complains of symptoms per HPI as well as the following symptoms memory loss. Pertinent negatives and positives per HPI. All others negative.   Social History   Socioeconomic History   Marital status: Married    Spouse name: Not on file   Number of children: Not on file   Years of education: Not on file   Highest education level: Not on file  Occupational History   Not on file  Tobacco Use   Smoking status: Former    Types: Cigarettes   Smokeless tobacco: Never  Vaping Use   Vaping Use: Never used  Substance and Sexual Activity   Alcohol use: Yes    Alcohol/week: 2.0 standard drinks    Types: 2 Glasses of wine per week    Comment: Just on Weekends   Drug use: No   Sexual activity: Yes    Birth control/protection: Post-menopausal  Other Topics Concern   Not on file  Social History Narrative   Not on file   Social Determinants of Health   Financial Resource Strain: Not on file  Food Insecurity: Not on file  Transportation Needs: Not on file  Physical Activity: Not on file  Stress: Not on file  Social Connections: Not on file  Intimate Partner Violence: Not on file    Family History  Problem Relation Age of Onset   Thyroid disease Mother    Heart disease Mother    Diabetes Mother    Asthma Father    Stroke Father    Cancer - Colon Other    Memory loss Neg Hx     Past Medical History:  Diagnosis Date   Anxiety    Bipolar disorder ([place])    Bundle branch block    Constipation    Depression    Elevated cholesterol    dx in early 10s   Endometriosis    Fatty liver    Female bladder prolapse    Fibroid    Headache(784.0)    Hyperlipidemia    Hypothyroidism    Mitral valve prolapse    during adolescence   OSA (obstructive sleep apnea)     mild (HST 05/2018 ESS 15 AHI-10/hr, 02 min-82%) dream enactment behavior- improved on APAP   Ovarian torsion    Pancreatitis    Pituitary macroadenoma ([place])    Shortness of breath     Patient Active Problem List   Diagnosis Date Noted   Sinus tachycardia 02/02/2019   Palpitations 10/11/2018   Hypoxia 06/15/2014   Bundle branch block    Shortness of breath    Hypothyroidism    Bipolar disorder ([place])    Fibroid    Endometriosis    Pancreatitis    Ovarian torsion    Female bladder prolapse    Chest pain, unspecified 09/05/2013   Elevated cholesterol    Mitral valve prolapse     Past Surgical History:  Procedure Laterality Date   ABDOMINAL HYSTERECTOMY     APPENDECTOMY     CHOLECYSTECTOMY     COLONOSCOPY     DILATION AND CURETTAGE OF UTERUS     ESOPHAGOGASTRODUODENOSCOPY (EGD) WITH PROPOFOL N/A 09/09/2013   Procedure: ESOPHAGOGASTRODUODENOSCOPY (  EGD) WITH PROPOFOL;  Surgeon: Arta Silence, MD;  Location: [facility];  Service: Endoscopy;  Laterality: N/A;   EUS N/A 09/09/2013   Procedure: ESOPHAGEAL ENDOSCOPIC ULTRASOUND (EUS) RADIAL;  Surgeon: Arta Silence, MD;  Location: [facility] ENDOSCOPY;  Service: Endoscopy;  Laterality: N/A;   HERNIA REPAIR     LAPAROSCOPY Left 11/15/2013   Procedure: LAPAROSCOPY OPERATIVE WITH LEFT SALPINGO-OOPHORECTOMY/LYSIS OF ADHESIONS/REMOVAL OF LEFT OVARIAN CYST;  Surgeon: Betsy Coder, MD;  Location: [facility];  Service: Gynecology;  Laterality: Left;   OOPHORECTOMY     pilonidal cyst-age 61      Current Outpatient Medications  Medication Sig Dispense Refill   acetaminophen (TYLENOL) 500 MG tablet Take 1,000 mg by mouth every 6 (six) hours as needed for mild pain.     B Complex-C (B-COMPLEX WITH VITAMIN C) tablet Take 1 tablet by mouth daily.     Biotin 5000 MCG CAPS Take 1 capsule by mouth daily.     BIOTIN PO Take 1 tablet by mouth daily.     Cholecalciferol (VITAMIN D3) 125 MCG (5000 UT) CAPS Take 2 capsules by mouth daily.      Cholecalciferol (VITAMIN D3) 2000 UNITS TABS Take 2 tablets by mouth daily.      hydrocortisone 2.5 % cream Apply 1 application topically daily.     lamoTRIgine (LAMICTAL) 200 MG tablet Take 1 tablet by mouth every evening.     levothyroxine (SYNTHROID) 88 MCG tablet Take 88 mcg by mouth daily.     loratadine (CLARITIN) 10 MG tablet Take 1 tablet by mouth as needed.     MAGNESIUM PO Take 1 tablet by mouth daily.     meclizine (ANTIVERT) 25 MG tablet Take 1 tablet (25 mg total) by mouth 3 (three) times daily as needed for dizziness. 20 tablet 0   metoprolol succinate (TOPROL XL) 25 MG 24 hr tablet Take 1 tablet (25 mg total) by mouth 2 (two) times daily. 180 tablet 3   metroNIDAZOLE (METROCREAM) 0.75 % cream Apply 1 application topically as needed.     NIACIN PO Take 500 mg by mouth daily.      venlafaxine XR (EFFEXOR-XR) 150 MG 24 hr capsule Take 1 capsule by mouth daily.     lidocaine (LIDODERM) 5 % Place 1 patch onto the skin daily. Remove & Discard patch within 12 hours or as directed by MD 7 patch 0   No current facility-administered medications for this visit.    Allergies as of 03/27/2021 - Review Complete 03/27/2021  Allergen Reaction Noted   Vancomycin Hives 09/05/2013   Ibuprofen  09/27/2018   Oxycodone-acetaminophen Itching 05/20/2016   Toradol [ketorolac tromethamine] Itching and Other (See Comments) 07/19/2018   Penicillins Hives 09/05/2013    Vitals: BP 109/73   Pulse 78   Ht 5\' 5"  (1.651 m)   Wt 156 lb 3.2 oz (70.9 kg)   BMI 25.99 kg/m  Last Weight:  Wt Readings from Last 1 Encounters:  03/27/21 156 lb 3.2 oz (70.9 kg)   Last Height:   Ht Readings from Last 1 Encounters:  03/27/21 5\' 5"  (1.651 m)     Physical exam: Exam: Gen: NAD, conversant, well nourised, well groomed                     CV: RRR, no MRG. No Carotid Bruits. No peripheral edema, warm, nontender Eyes: Conjunctivae clear without exudates or hemorrhage  Neuro: Detailed Neurologic  Exam  Speech:    Speech is normal; fluent  and spontaneous with normal comprehension.  Cognition:  MMSE - Mini Mental State Exam 03/27/2021  Orientation to time 5  Orientation to Place 5  Registration 3  Attention/ Calculation 4  Recall 3  Language- name 2 objects 2  Language- repeat 0  Language- follow 3 step command 3  Language- read & follow direction 1  Write a sentence 1  Copy design 1  Total score 28       The patient is oriented to person, place, and time;     recent and remote memory intact;     language fluent;     normal attention, concentration,     fund of knowledge Cranial Nerves:    The pupils are equal, round, and reactive to light. The fundi are normal and spontaneous venous pulsations are present. Visual fields are full to finger confrontation. Extraocular movements are intact. Trigeminal sensation is intact and the muscles of mastication are normal. The face is symmetric. The palate elevates in the midline. Hearing intact. Voice is normal. Shoulder shrug is normal. The tongue has normal motion without fasciculations.   Coordination:    Normal   Gait:    normal.   Motor Observation:    No asymmetry, no atrophy, and no involuntary movements noted. Tone:    Normal muscle tone.    Posture:    Posture is normal. normal erect    Strength:    Strength is V/V in the upper and lower limbs.      Sensation: intact to LT     Reflex Exam:  DTR's:    Deep tendon reflexes in the upper and lower extremities are normal bilaterally.   Toes:    The toes are downgoing bilaterally.   Clonus:    Clonus is absent.    Assessment/Plan:  55 year old with anxiety, bipolar disorder, depression, fatty liver, hld, hypothyroidism, osa(mild on cpap) for cognitive difficulties/memory loss 10 years. Recent MRI brain unremarkable.  I do not appreciate any neurodegenerative disorder in this patient. Will send her for speech therapy/cognitive therapy and if that doesn't help  and she would like to get tested for learning disabilities or adhd I suggest talking to primary care for referral.   Speech therapy: Cogniitve therapy, memory loss, difficulty with speech and getting thoughts out.  Return to pcp  Orders Placed This Encounter  Procedures   B12 and Folate Panel   Methylmalonic acid, serum   Vitamin B1   Ambulatory referral to Speech Therapy    No orders of the defined types were placed in this encounter.   Cc: Sarita Haver, [place]  Sarina Ill, MD  [facility] [address] [place], [place] [address]  Phone [phone] Fax [phone]

## 2021-03-28 ENCOUNTER — Encounter: Payer: Self-pay | Admitting: Neurology

## 2021-04-02 NOTE — Telephone Encounter (Signed)
Left message to remind pt of appt for 04-03-2021 with Dr. Alene Mires.

## 2021-04-03 ENCOUNTER — Ambulatory Visit
Admit: 2021-04-03 | Discharge: 2021-04-03 | Payer: BLUE CROSS/BLUE SHIELD | Attending: Vascular Surgery | Primary: Family Medicine

## 2021-04-03 DIAGNOSIS — I83893 Varicose veins of bilateral lower extremities with other complications: Secondary | ICD-10-CM

## 2021-04-03 NOTE — Progress Notes (Signed)
04/03/2021    Lauren Hull  10-12-1965    Chief Complaint   Patient presents with    Post-Op Check     S/p stab phleb       Patient returns for post operative evaluation back to me.  Overall she is doing very well and has lot of symptomatic relief.  She has a suture that is present which was removed.  See above.  Otherwise she is doing very well denies any chest pain or shortness of breath.          Procedure Laterality Date    BACK SURGERY  2019    ECTOPIC PREGNANCY SURGERY  1986    JOINT REPLACEMENT Left 06/2019    hip    NERVE BLOCK Left 07/20/2015    transforaminal nerve, lumbar #1    NERVE BLOCK Left 08/17/2015    NERVE BLOCK Left 09/06/2015    lumbar tfnb #3    NERVE BLOCK Left 11/01/2015    medial branch block, lumbar #1    NERVE BLOCK Right 11/15/2015    MBB #2    NERVE BLOCK Left 02/06/2016    lumbar radiofrequency     TUBAL LIGATION      UPPER GASTROINTESTINAL ENDOSCOPY N/A 03/16/2020    EGD BIOPSY performed by Loreli Slot, MD at [facility]COPY    VEIN SURGERY      VEIN SURGERY Left 02/15/2021    STAB PHLEBECTOMIES LEFT LOWER EXTREMITY performed by Jinny Blossom, MD at [facility] OR    VEIN SURGERY Right 03/21/2021    RIGHT LOWER EXTREMITY STAB PHLEBECTOMY performed by Jinny Blossom, MD at [facility] OR       Physical Exam:  The leg incisions are healing without evidence of infection.  No surrounding erythema, drainage or increased warmth. No new swelling. RLE with + varicose veins in the anterior-lateral branch distribution w telangiectasias    Assessment:  Post-operative left saphenous vein stab phlebectomy    Problem List Items Addressed This Visit    None  Visit Diagnoses       Symptomatic varicose veins of both lower extremities    -  Primary            I reviewed with the patient that normal activities can be resumed as tolerated. She is doing very well post-operatively and her symptoms drastically improved. She desires intervention on the RLE. Will schedule. She understands the risks, benefits,  alternatives of RLE stab phlebectomy and would like to proceed.    Above.  No change.  A small suture was removed at the bedside today.  She can follow-up as needed.          Jinny Blossom, MD

## 2021-04-08 DIAGNOSIS — E039 Hypothyroidism, unspecified: Secondary | ICD-10-CM | POA: Diagnosis not present

## 2021-04-11 DIAGNOSIS — R197 Diarrhea, unspecified: Secondary | ICD-10-CM | POA: Diagnosis not present

## 2021-04-23 DIAGNOSIS — J069 Acute upper respiratory infection, unspecified: Secondary | ICD-10-CM | POA: Diagnosis not present

## 2021-04-24 ENCOUNTER — Ambulatory Visit: Payer: Federal, State, Local not specified - PPO

## 2021-04-29 ENCOUNTER — Other Ambulatory Visit: Payer: Self-pay | Admitting: Family Medicine

## 2021-04-29 DIAGNOSIS — Z1231 Encounter for screening mammogram for malignant neoplasm of breast: Secondary | ICD-10-CM

## 2021-04-30 ENCOUNTER — Ambulatory Visit: Payer: Federal, State, Local not specified - PPO | Attending: Neurology

## 2021-04-30 ENCOUNTER — Other Ambulatory Visit: Payer: Self-pay

## 2021-04-30 DIAGNOSIS — G4733 Obstructive sleep apnea (adult) (pediatric): Secondary | ICD-10-CM | POA: Diagnosis not present

## 2021-04-30 DIAGNOSIS — R41841 Cognitive communication deficit: Secondary | ICD-10-CM | POA: Insufficient documentation

## 2021-04-30 DIAGNOSIS — R4701 Aphasia: Secondary | ICD-10-CM | POA: Diagnosis not present

## 2021-05-01 ENCOUNTER — Ambulatory Visit
Admission: RE | Admit: 2021-05-01 | Discharge: 2021-05-01 | Disposition: A | Discharge status: Home / Self Care | Payer: Federal, State, Local not specified - PPO | Source: Ambulatory Visit | Attending: Family Medicine | Admitting: Family Medicine

## 2021-05-01 DIAGNOSIS — Z1231 Encounter for screening mammogram for malignant neoplasm of breast: Secondary | ICD-10-CM | POA: Diagnosis not present

## 2021-05-02 ENCOUNTER — Ambulatory Visit: Payer: Federal, State, Local not specified - PPO

## 2021-05-02 ENCOUNTER — Other Ambulatory Visit: Payer: Self-pay

## 2021-05-02 DIAGNOSIS — R41841 Cognitive communication deficit: Secondary | ICD-10-CM

## 2021-05-02 DIAGNOSIS — R4701 Aphasia: Secondary | ICD-10-CM

## 2021-05-02 NOTE — Therapy (Addendum)
[facility] [address] [place], [place], [address] Phone: [phone]   Fax:  [phone]  Speech Language Pathology Evaluation  Patient Details  Name: Alyssa Holmes MRN: [id number] Date of Birth: Jul 11, 1965 Referring Provider (SLP): Sarina Ill, MD   Encounter Date: 04/30/2021   End of Session - 05/02/21 1154     Visit Number 1    Number of Visits 4    Date for SLP Re-Evaluation 06/14/21    SLP Start Time 1405    SLP Stop Time  1445    SLP Time Calculation (min) 40 min    Activity Tolerance Patient tolerated treatment well             Past Medical History:  Diagnosis Date   Anxiety    Bipolar disorder ([place])    Bundle branch block    Constipation    Depression    Elevated cholesterol    dx in early 66s   Endometriosis    Fatty liver    Female bladder prolapse    Fibroid    Headache(784.0)    Hyperlipidemia    Hypothyroidism    Mitral valve prolapse    during adolescence   OSA (obstructive sleep apnea)    mild (HST 05/2018 ESS 15 AHI-10/hr, 02 min-82%) dream enactment behavior- improved on APAP   Ovarian torsion    Pancreatitis    Pituitary macroadenoma ([facility])    Shortness of breath     Past Surgical History:  Procedure Laterality Date   ABDOMINAL HYSTERECTOMY     APPENDECTOMY     CHOLECYSTECTOMY     COLONOSCOPY     DILATION AND CURETTAGE OF UTERUS     ESOPHAGOGASTRODUODENOSCOPY (EGD) WITH PROPOFOL N/A 09/09/2013   Procedure: ESOPHAGOGASTRODUODENOSCOPY (EGD) WITH PROPOFOL;  Surgeon: Arta Silence, MD;  Location: [facility] END[facility]COPY;  Service: Endoscopy;  Laterality: N/A;   EUS N/A 09/09/2013   Procedure: ESOPHAGEAL ENDOSCOPIC ULTRASOUND (EUS) RADIAL;  Surgeon: Arta Silence, MD;  Location: [facility] ENDOSCOPY;  Service: Endoscopy;  Laterality: N/A;   HERNIA REPAIR     LAPAROSCOPY Left 11/15/2013   Procedure: LAPAROSCOPY OPERATIVE WITH LEFT SALPINGO-OOPHORECTOMY/LYSIS OF ADHESIONS/REMOVAL OF LEFT  OVARIAN CYST;  Surgeon: Betsy Coder, MD;  Location: [facility];  Service: Gynecology;  Laterality: Left;   OOPHORECTOMY     pilonidal cyst-age 46      There were no vitals filed for this visit.   Subjective Assessment - 05/02/21 1137     Subjective "If someone tells me something I forget it."    Currently in Pain? No/denies                SLP Evaluation [facility] - 05/02/21 1137       SLP Visit Information   SLP Received On 04/30/21    Referring Provider (SLP) Sarina Ill, MD    Onset Date Ten years ago - started    Medical Diagnosis Memory loss      Subjective   Subjective "Sometimes the wrong words come out" (e.g., "gush"/gosh)    Patient/Family Stated Goal Improve memory      General Information   HPI Pt with complex medical hx -      Prior Functional Status   Cognitive/Linguistic Baseline Baseline deficits    Baseline deficit details memory    Vocation Part time employment      Cognition   Overall Cognitive Status Impaired/Different from baseline    Area of Impairment Memory   [employer]  Learning Test - impaired recognition- auditory processing deficit(?)   Memory Decreased short-term memory    Memory Comments "I have a 'book of information' at work that I use that has everything I need to do my job, so I won't forget anything." Pt adds to this book PRN.      Auditory Comprehension   Overall Auditory Comprehension Appears within functional limits for tasks assessed    Overall Auditory Comprehension Comments QAB receptive languge subtests were completed WNL      Verbal Expression   Overall Verbal Expression Appears within functional limits for tasks assessed    Other Verbal Expression Comments Expressive language portion of QAB pt scored WNL. Boston Naming Test -2 (BNT-2) score 29/30 (evens) = WNL confrontation naming      Oral Motor/Sensory Function   Overall Oral Motor/Sensory Function Appears within functional limits for tasks assessed      Motor  Speech   Articulation --   In [place] tasks, pt had very mild distortion of vowel sounds   Motor Planning --   in alternate motion rate task of lingual tip between lingual margins some non-rhythmic movements were observed. Nothing was seen during conversation nor in evaluation tasks that would lead SLP to believe pt having apraxia in connected speech.     Standardized Assessments   Standardized Assessments  Other Assessment   Communicative Participation Item Bank - 13/30 (lower scores mean less communicative participation)                            SLP Education - 05/02/21 1152     Education Details Pt scoring WNL on language testing and on naming testing, and has mild deficit with memory recognition, suggesting neuropsycholgical evaluation, therapy for some compensatory measures    Person(s) Educated Patient    Methods Explanation    Comprehension Verbalized understanding                SLP Long Term Goals - 05/02/21 1330       SLP LONG TERM GOAL #1   Title Pt will develop a functional memory system for home environment    Time 6    Period Weeks    Status New    Target Date 06/14/21      SLP LONG TERM GOAL #2   Title pt will report  she is using memory system successfully at home during two sessions    Time 6    Period Weeks    Status New    Target Date 06/14/21      SLP LONG TERM GOAL #3   Title pt will score higher than 17/30 on Communication Participation questionnaire    Time 6    Period Weeks    Status New    Target Date 06/14/21              Plan - 05/02/21 1155     Clinical Impression Statement Alyssa Holmes presents today with WNL language testing results with Quick Aphasia Battery (QAB), and with the [place] -2. With [place] Verbal Learning Test, pt had WNL recall but recognition was outside WNL, suggesting possible auditory processing defict(?). On repetition tasks on QAB pt had some difficulty. Lastly, pt demonstrated some oral  non-verbal apraxic-like behaviors and some very mild vowel distortion on diadochokinetic task ("puhtuhkuh" repeated 10 times). Because of these scores, SLP suggests neuropsychological testing to better narrow pt's deficit areas and how they may interact with  pt's language. SLP will guide pt through a short course of skilled ST focusing on compensatory techniques for home environment and work. Pt agrees to this plan.    Speech Therapy Frequency --   approx every other week   Duration --   6 weeks   Treatment/Interventions SLP instruction and feedback;Compensatory strategies;Internal/external aids;Environmental controls;Patient/family education    Potential to Achieve Goals Good    Potential Considerations --   uncertain neurological diagnosis   Consulted and Agree with Plan of Care Patient             Patient will benefit from skilled therapeutic intervention in order to improve the following deficits and impairments:   Aphasia - Plan: SLP plan of care cert/re-cert  Cognitive communication deficit - Plan: SLP plan of care cert/re-cert    Problem List Patient Active Problem List   Diagnosis Date Noted   Sinus tachycardia 02/02/2019   Palpitations 10/11/2018   Hypoxia 06/15/2014   Bundle branch block    Shortness of breath    Hypothyroidism    Bipolar disorder ([place])    Fibroid    Endometriosis    Pancreatitis    Ovarian torsion    Female bladder prolapse    Chest pain, unspecified 09/05/2013   Elevated cholesterol    Mitral valve prolapse     Alyssa Holmes ,MS, CCC-SLP  05/02/2021, 2:06 PM  [facility] [address] [place], [place], [address] Phone: [phone]   Fax:  [phone]  Name: Alyssa Holmes MRN: [id number] Date of Birth: 09-May-1966

## 2021-05-02 NOTE — Patient Instructions (Signed)
   [employer] - 15% off monthly subscription - [employer]

## 2021-05-02 NOTE — Therapy (Signed)
[facility] [address] [place], [place], [address] Phone: [phone]   Fax:  [phone]  Speech Language Pathology Treatment  Patient Details  Name: Alyssa Holmes MRN: [id number] Date of Birth: 1966/04/16 Referring Provider (SLP): Sarina Ill, MD   Encounter Date: 05/02/2021   End of Session - 05/02/21 1701     Visit Number 2    Number of Visits 4    Date for SLP Re-Evaluation 06/14/21    SLP Start Time 1450    SLP Stop Time  7026    SLP Time Calculation (min) 40 min    Activity Tolerance Patient tolerated treatment well             Past Medical History:  Diagnosis Date   Anxiety    Bipolar disorder ([place])    Bundle branch block    Constipation    Depression    Elevated cholesterol    dx in early 59s   Endometriosis    Fatty liver    Female bladder prolapse    Fibroid    Headache(784.0)    Hyperlipidemia    Hypothyroidism    Mitral valve prolapse    during adolescence   OSA (obstructive sleep apnea)    mild (HST 05/2018 ESS 15 AHI-10/hr, 02 min-82%) dream enactment behavior- improved on APAP   Ovarian torsion    Pancreatitis    Pituitary macroadenoma (HCC)    Shortness of breath     Past Surgical History:  Procedure Laterality Date   ABDOMINAL HYSTERECTOMY     APPENDECTOMY     CHOLECYSTECTOMY     COLONOSCOPY     DILATION AND CURETTAGE OF UTERUS     ESOPHAGOGASTRODUODENOSCOPY (EGD) WITH PROPOFOL N/A 09/09/2013   Procedure: ESOPHAGOGASTRODUODENOSCOPY (EGD) WITH PROPOFOL;  Surgeon: Arta Silence, MD;  Location: [facility] ENDOSCOPY;  Service: Endoscopy;  Laterality: N/A;   EUS N/A 09/09/2013   Procedure: ESOPHAGEAL ENDOSCOPIC ULTRASOUND (EUS) RADIAL;  Surgeon: Arta Silence, MD;  Location: [facility] ENDOSCOPY;  Service: Endoscopy;  Laterality: N/A;   HERNIA REPAIR     LAPAROSCOPY Left 11/15/2013   Procedure: LAPAROSCOPY OPERATIVE WITH LEFT SALPINGO-OOPHORECTOMY/LYSIS OF ADHESIONS/REMOVAL OF LEFT  OVARIAN CYST;  Surgeon: Betsy Coder, MD;  Location: [facility];  Service: Gynecology;  Laterality: Left;   OOPHORECTOMY     pilonidal cyst-age 73      There were no vitals filed for this visit.   Subjective Assessment - 05/02/21 1503     Subjective "I forget to tell people why I'm saying what I'm saying -it happens all the time. I'm afraid I'll forget information."    Currently in Pain? No/denies                   ADULT SLP TREATMENT - 05/02/21 1514       General Information   Behavior/Cognition Alert;Cooperative;Pleasant mood      Treatment Provided   Treatment provided Cognitive-Linquistic      Cognitive-Linquistic Treatment   Treatment focused on Patient/family/caregiver education    Skilled Treatment SLP targeted some compensatory measures for pt's symptoms of forgetfulness. Suggested downloading Constant Therapy app and pt did so during ST today. Discussion about announcing the reason for her inquiries as Alyssa Holmes stated she will get people asking her "what do you mean" a lot of the time because she has not stated her reason for coming to them. Alyssa Holmes also says she has difficulty remembering to ask her husband things and SLP suggested using  notes app in her phone making folder "Alyssa Holmes" (husband's name) to write down comments/events/questions for him and reviewing each night at dinner or during commercial. Pt stated this idea would work for her at home. She got set up with constant therapy today and will begin doing this at home 45-60 minutes a day.      Assessment / Recommendations / Plan   Plan Continue with current plan of care      Progression Toward Goals   Progression toward goals Progressing toward goals              SLP Education - 05/02/21 1700     Education Details constant therapy app, making notes to talk tohubby at night (thoughts throughout day so she won't forget)    Person(s) Educated Patient    Methods Explanation    Comprehension Verbalized  understanding;Need further instruction                SLP Long Term Goals - 05/02/21 [place] #1   Title Pt will develop a functional memory system for home environment    Time 6    Period Weeks    Status On-going      SLP LONG TERM GOAL #2   Title pt will report  she is using memory system successfully at home during two sessions    Time 6    Period Weeks    Status On-going      SLP LONG TERM GOAL #3   Title pt will score higher than 17/30 on Communication Participation questionnaire    Time 6    Period Weeks    Status On-going              Plan - 05/02/21 1701     Clinical Impression Statement Sayre presents today with WNL language testing results with Quick Aphasia Battery (QAB), and with the [place] -2. With Hopkins Verbal Learning Test, pt had WNL recall but recognition was outside WNL, suggesting possible auditory processing defict(?).  SLP began guiding pt through a short course of skilled ST focusing on compensatory techniques for home environment and work; Today SLP showed pt Constant Therapy app for her to download and begin to use. Pt agrees to this plan.    Speech Therapy Frequency --   approx every other week   Duration --   6 weeks   Treatment/Interventions SLP instruction and feedback;Compensatory strategies;Internal/external aids;Environmental controls;Patient/family education    Potential to Achieve Goals Good    Potential Considerations --   uncertain neurological diagnosis   Consulted and Agree with Plan of Care Patient             Patient will benefit from skilled therapeutic intervention in order to improve the following deficits and impairments:   Aphasia  Cognitive communication deficit    Problem List Patient Active Problem List   Diagnosis Date Noted   Sinus tachycardia 02/02/2019   Palpitations 10/11/2018   Hypoxia 06/15/2014   Bundle branch block    Shortness of breath    Hypothyroidism     Bipolar disorder ([place])    Fibroid    Endometriosis    Pancreatitis    Ovarian torsion    Female bladder prolapse    Chest pain, unspecified 09/05/2013   Elevated cholesterol    Mitral valve prolapse     Alyssa Holmes ,MS, CCC-SLP  05/02/2021, 5:06 PM  [facility] [address], [place], [place], [address] Phone: [phone]   Fax:  [phone]   Name: Alyssa Holmes MRN: [id number] Date of Birth: 1966/01/02

## 2021-05-14 ENCOUNTER — Other Ambulatory Visit: Payer: Self-pay

## 2021-05-14 ENCOUNTER — Ambulatory Visit: Payer: Federal, State, Local not specified - PPO

## 2021-05-14 DIAGNOSIS — R4701 Aphasia: Secondary | ICD-10-CM

## 2021-05-14 DIAGNOSIS — R41841 Cognitive communication deficit: Secondary | ICD-10-CM

## 2021-05-14 NOTE — Therapy (Signed)
[facility] [address], [place], [place], [address] Phone: [phone]   Fax:  [phone]  Speech Language Pathology Treatment/Discharge summary  Patient Details  Name: Alyssa Holmes MRN: [id number] Date of Birth: Oct 15, 1965 Referring Provider (SLP): Sarina Ill, MD   Encounter Date: 05/14/2021   End of Session - 05/14/21 1636     Visit Number 3    Number of Visits 4    Date for SLP Re-Evaluation 06/14/21    SLP Start Time 1449    SLP Stop Time  2637    SLP Time Calculation (min) 41 min    Activity Tolerance Patient tolerated treatment well             Past Medical History:  Diagnosis Date   Anxiety    Bipolar disorder ([place])    Bundle branch block    Constipation    Depression    Elevated cholesterol    dx in early 45s   Endometriosis    Fatty liver    Female bladder prolapse    Fibroid    Headache(784.0)    Hyperlipidemia    Hypothyroidism    Mitral valve prolapse    during adolescence   OSA (obstructive sleep apnea)    mild (HST 05/2018 ESS 15 AHI-10/hr, 02 min-82%) dream enactment behavior- improved on APAP   Ovarian torsion    Pancreatitis    Pituitary macroadenoma (HCC)    Shortness of breath     Past Surgical History:  Procedure Laterality Date   ABDOMINAL HYSTERECTOMY     APPENDECTOMY     CHOLECYSTECTOMY     COLONOSCOPY     DILATION AND CURETTAGE OF UTERUS     ESOPHAGOGASTRODUODENOSCOPY (EGD) WITH PROPOFOL N/A 09/09/2013   Procedure: ESOPHAGOGASTRODUODENOSCOPY (EGD) WITH PROPOFOL;  Surgeon: Arta Silence, MD;  Location: [facility] ENDOSCOPY;  Service: Endoscopy;  Laterality: N/A;   EUS N/A 09/09/2013   Procedure: ESOPHAGEAL ENDOSCOPIC ULTRASOUND (EUS) RADIAL;  Surgeon: Arta Silence, MD;  Location: [facility] ENDOSCOPY;  Service: Endoscopy;  Laterality: N/A;   HERNIA REPAIR     LAPAROSCOPY Left 11/15/2013   Procedure: LAPAROSCOPY OPERATIVE WITH LEFT SALPINGO-OOPHORECTOMY/LYSIS OF  ADHESIONS/REMOVAL OF LEFT OVARIAN CYST;  Surgeon: Betsy Coder, MD;  Location: [facility];  Service: Gynecology;  Laterality: Left;   OOPHORECTOMY     pilonidal cyst-age 46       SPEECH THERAPY DISCHARGE SUMMARY  Visits from Start of Care: 3  Current functional level related to goals / functional outcomes: See below. Pt reports better use of self-written cues than prior to ST.   Remaining deficits: Deficits in functional cognitive linguistics remain.    Education / Equipment: Compensations for deficit areas.  Patient agrees to discharge. Patient goals were partially met. Patient is being discharged due to  compensations were covered and begun to be used .Marland Kitchen     There were no vitals filed for this visit.   Subjective Assessment - 05/14/21 1452     Subjective I am writing more things down so that I can remember; we are probably going to do a calendar mostly to preplan meals."    Currently in Pain? No/denies                   ADULT SLP TREATMENT - 05/14/21 1451       General Information   Behavior/Cognition Alert;Cooperative;Pleasant mood      Treatment Provided   Treatment provided Cognitive-Linquistic      Cognitive-Linquistic  Treatment   Treatment focused on Patient/family/caregiver education    Skilled Treatment With the holiday and some family health issues pt was unable to complete constant therapy (CT). Pt voiced significant frustrations from work "It happened again where I got totally lost and then I was so frustrated." Alyssa Holmes described a busy work environment and SLP told pt to think about how to modify her environment to make it less busy adn distracting. Pt described her difficulty with a process of transferring written information  - she has no way of marking her place and needs to print off the document, which her employer allows. SLP congratulated pt on this compensation and suggested that to decr distractions she block her day in segments to stay more  focused. Pt acknowledged this occurs at home too and SLP suggested pt plan out her next day with a to-do list. Pt endorsed procrastination and SLP encouraged her to at least make a plan so that if she needs to put off a job or two (e.g., [employer] work) she doesn't have the additional frustration of not remembering what she needs to do. Pt discussed neuropsych with SLP and SLP reinforced this might be hlepful to pinpoint deficit areas, and to assist pt with strategies to cope with her feelings about her deficits.      Assessment / Recommendations / Plan   Plan Other (Comment)   d/c day - pt agrees with this plan     Progression Toward Goals   Progression toward goals --   d/c day - see goals             SLP Education - 05/14/21 1636     Education Details see note    Person(s) Educated Patient    Methods Explanation    Comprehension Verbalized understanding;Returned demonstration                SLP Long Term Goals - 05/14/21 1637       SLP LONG TERM GOAL #1   Title Pt will develop a functional memory system for home environment    Status Achieved      SLP LONG TERM GOAL #2   Title pt will report  she is using memory system successfully at home during two sessions    Status Partially Met      SLP LONG TERM GOAL #3   Title pt will score higher than 17/30 on Communication Participation questionnaire    Status Deferred   pt unable to complete final copy             Plan - 05/14/21 1636     Clinical Impression Statement Alyssa Holmes presents today with WNL language testing results with Quick Aphasia Battery (QAB), and with the [place] -2. With [employer] Verbal Learning Test, pt had WNL recall but recognition was outside WNL, suggesting possible auditory processing defict(?).  Today SLP assisted pt with some strategies for decring distractions in teh workplace and home. Pt agreed to d/c at this time.    Speech Therapy Frequency --   approx  every other week   Duration --   6 weeks   Treatment/Interventions SLP instruction and feedback;Compensatory strategies;Internal/external aids;Environmental controls;Patient/family education    Potential to Achieve Goals Good    Potential Considerations --   uncertain neurological diagnosis   Consulted and Agree with Plan of Care Patient             Patient will benefit from skilled therapeutic intervention in  order to improve the following deficits and impairments:   Cognitive communication deficit  Aphasia    Problem List Patient Active Problem List   Diagnosis Date Noted   Sinus tachycardia 02/02/2019   Palpitations 10/11/2018   Hypoxia 06/15/2014   Bundle branch block    Shortness of breath    Hypothyroidism    Bipolar disorder ([place])    Fibroid    Endometriosis    Pancreatitis    Ovarian torsion    Female bladder prolapse    Chest pain, unspecified 09/05/2013   Elevated cholesterol    Mitral valve prolapse     Alyssa Holmes, CCC-SLP 05/14/2021, 4:39 PM  [facility] [address], [place], [place], [address] Phone: [phone]   Fax:  [phone]   Name: Alyssa Holmes MRN: [id number] Date of Birth: 1966-02-25

## 2021-05-22 ENCOUNTER — Encounter: Payer: Self-pay | Admitting: Neurology

## 2021-05-23 ENCOUNTER — Other Ambulatory Visit: Payer: Self-pay | Admitting: Neurology

## 2021-05-23 ENCOUNTER — Encounter: Payer: Self-pay | Admitting: Neurology

## 2021-05-23 DIAGNOSIS — R4189 Other symptoms and signs involving cognitive functions and awareness: Secondary | ICD-10-CM

## 2021-05-24 ENCOUNTER — Encounter: Payer: Self-pay | Admitting: Psychology

## 2021-05-30 DIAGNOSIS — G4733 Obstructive sleep apnea (adult) (pediatric): Secondary | ICD-10-CM | POA: Diagnosis not present

## 2021-06-30 DIAGNOSIS — G4733 Obstructive sleep apnea (adult) (pediatric): Secondary | ICD-10-CM | POA: Diagnosis not present

## 2021-10-07 DIAGNOSIS — E039 Hypothyroidism, unspecified: Secondary | ICD-10-CM | POA: Diagnosis not present

## 2021-10-07 DIAGNOSIS — E041 Nontoxic single thyroid nodule: Secondary | ICD-10-CM | POA: Diagnosis not present

## 2021-10-07 DIAGNOSIS — R197 Diarrhea, unspecified: Secondary | ICD-10-CM | POA: Diagnosis not present

## 2021-10-30 DIAGNOSIS — H1031 Unspecified acute conjunctivitis, right eye: Secondary | ICD-10-CM | POA: Diagnosis not present

## 2021-10-30 DIAGNOSIS — H5789 Other specified disorders of eye and adnexa: Secondary | ICD-10-CM | POA: Diagnosis not present

## 2021-11-06 ENCOUNTER — Encounter: Payer: Federal, State, Local not specified - PPO | Attending: Psychology | Admitting: Psychology

## 2021-11-06 ENCOUNTER — Encounter: Payer: Self-pay | Admitting: Psychology

## 2021-11-06 DIAGNOSIS — R413 Other amnesia: Secondary | ICD-10-CM | POA: Insufficient documentation

## 2021-11-06 DIAGNOSIS — R4789 Other speech disturbances: Secondary | ICD-10-CM | POA: Insufficient documentation

## 2021-11-06 DIAGNOSIS — G4752 REM sleep behavior disorder: Secondary | ICD-10-CM | POA: Diagnosis not present

## 2021-11-06 DIAGNOSIS — R4189 Other symptoms and signs involving cognitive functions and awareness: Secondary | ICD-10-CM | POA: Diagnosis not present

## 2021-11-06 NOTE — Progress Notes (Signed)
Neuropsychological Consultation   Patient:   Alyssa Holmes   DOB:   [birth year]  MR Number:  [id number]  Location:  [facility] [place], [place] [id number] [facility] [place] [address] Dept: [phone]           Date of Service:   11/06/2021  Start Time:   9 AM End Time:   11 AM  Today's visit was a in person visit that was conducted in my outpatient clinic office.  The patient myself were present in this face-to-face visit.  1 hour and 15 minutes was spent in formal clinical interview and the other 45 minutes was spent with record review, report writing and setting up testing protocols.  Provider/Observer:  Ilean Skill, Psy.D.       Clinical Neuropsychologist       Billing Code/Service: 96116/[id number]  Chief Complaint:    Alyssa Holmes is a 56 year old female who is referred for neuropsychological evaluation by Sarina Ill, MD who is her treating neurologist.  She was initially referred for neurological work-up by the patient's PCP Ammie Dalton, PA-C.  The patient describes a roughly 10-year history of memory difficulties and word finding difficulties along with confusion that she feels is progressive in nature but clearly very slowly progressing.  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  The patient also has a prior psychiatric history including diagnosis of bipolar affective disorder that has been regularly and effectively managed psychotropic Lee, anxiety and long-term sleep disturbance.  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  Reason for Service:  Alyssa Holmes is a  56 year old female who is referred for neuropsychological evaluation by Sarina Ill, MD who is her treating neurologist.  She was initially referred for neurological work-up by the patient's PCP Ammie Dalton, PA-C.  The patient describes a roughly 10-year history of memory difficulties and word finding difficulties along with confusion that she feels is progressive in nature but clearly very slowly progressing.  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  The patient also has a prior psychiatric history including diagnosis of bipolar affective disorder that has been regularly and effectively managed psychotropic Lee, anxiety and long-term sleep disturbance.  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  Other past medical history including an ovarian cyst, appendix issues, gallbladder removal, hysterectomy.  She has participated in physical therapy for neck and shoulder pain.  The patient also went through speech and language therapy with Corinda Gubler, CCC-SLP and derive some benefit from these interventions but plateaued and these efforts appear to of been completed.  The patient describes a roughly 10-year history of memory difficulties including forgetting day-to-day information as well as changes in verbal fluency with word finding difficulties.  The patient describes difficulties with confusion when people are trying to give her multiple information and questions at one time, difficulty understanding what is being told to her, difficulty finishing sentences and word finding issues as well as difficulty making sense of verbal directions being given to her.  The patient reports that the symptoms have been going on for roughly 10  years and she feels like her symptoms have gotten worse over time.  She describes difficulty recalling short-term memory types of information and difficulty understanding verbal directions.  The patient does report that if someone reminds her of a particular instance that she has not recalled that it will "come back to her."  She will spend a good bit of time trying to and searching for people's names.  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  She reports that there are some times where she will slur her words and stop midsentence.  The patient reports that her family are distressed reminding her of conversations that they have had previously and family worries about her being so young and having memory problems.  She reports that she feels like it is slowly getting worse.  The patient denies any geographic disorientation, tremors or auditory or visual hallucinations.  As far as sleep disturbance, the patient has been diagnosed with obstructive sleep apnea and compliantly and regularly uses her CPAP machine.  The patient reports that she has intrusive or running thoughts at night when she tries to go to sleep.  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  She reports that she has very vivid dreams that she remembers quite clearly long after she wakes up.  The patient has had an MRI conducted in both 2017 as well as repeat MRI conducted in 2022.  These were done because of recurrent headache that are preorbital in nature that has been going on for the past 6 months for the 2022 MRI.  Patient with history of pituitary micro adenoma in 2017.  The MRI was compared to 2017 and interpreted as a normal MRI of brain as well as pituitary gland.  There were no indications of infarct, hemorrhage or hydrocephalus.  There was noted small remote cerebral white matter insults without progression from 2017 that  were described as typically related to prior ischemia, infection, demyelinization or trauma events.  The patient has a history of 1 concussive event when she was a teen with brief loss of consciousness.  Patient denies any seizures, hallucinations or delusions.  The patient denies any family history of any progressive neurological disorders although there is a family history of bipolar disorder particularly for her father.  The patient's sister also has a history of bipolar affective disorder and received ECT treatment in the past as well as having psychiatric hospitalization and multiple medication interventions.  The patient's father is described as having significant manic episodes with significant anger issues.  The patient's father did develop cognitive deficits later in life that were attributed to repeated stroke events.  Behavioral Observation: Alyssa Holmes  presents as a 56 y.o.-year-old Right handed Caucasian Female who appeared her stated age. her dress was Appropriate and she was Well Groomed and her manners were Appropriate to the situation.  her participation was indicative of Appropriate behaviors.  There were not physical disabilities noted.  she displayed an appropriate level of cooperation and motivation.     Interactions:    Active Appropriate  Attention:   within normal limits and attention span and concentration were age appropriate  Memory:   within normal limits; recent and remote memory intact  Visuo-spatial:  not examined  Speech (Volume):  normal  Speech:   normal; normal  Thought Process:  Coherent  and Relevant  Though Content:  WNL; not suicidal and not homicidal  Orientation:   person, place, time/date, and situation  Judgment:   Good  Planning:   Fair  Affect:    Appropriate  Mood:    Euthymic  Insight:   Good  Intelligence:   normal  Marital Status/Living: The patient was born in [place] along with 2 brothers and a sister.   Her father had significant psychiatric illness with anger and manic episodes.  The patient is married and continues to live with her husband of 59 years and her 50 year old son.  The patient also has a 84 year old daughter.  Her son has been diagnosed with ADHD and Asperger's disorder and her 14 year old daughter was diagnosed with ADD/OCD.  Son also deals with depression.  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  She has never been terminated from a job.  Hobbies and interest include reading and participating in social group daughters of the [employer] revolution.  Substance Use:  No concerns of substance abuse are reported.  Education:   The patient graduated high school from [employer] in [place].  Her best subject was history and she did have some relative difficulties with math.  Extracurricular activities included running track.  She has also had training in cosmetology as well as in education around being a Pharmacist, hospital.  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[facility])    Shortness of breath          Patient Active Problem List   Diagnosis Date Noted   Sinus tachycardia 02/02/2019   Palpitations 10/11/2018   Hypoxia 06/15/2014   Bundle branch block    Shortness of breath    Hypothyroidism     Bipolar disorder ([place])    Fibroid    Endometriosis    Pancreatitis    Ovarian torsion    Female bladder prolapse    Chest pain, unspecified 09/05/2013   Elevated cholesterol    Mitral valve prolapse         Abuse/Trauma History: While the patient did not describe any particular abuse events or traumatic events she did describe her father is having significant difficulties with manic events that included considerable anger.  Psychiatric History:  Patient has a past psychiatric history of diagnosis with bipolar affective disorder that has been well managed with psychotropic medications through the years.  She also has issues with anxiety as well.  There is a significant family history of psychiatric illness including her father's diagnosis of bipolar disorder with manic episodes, her sister who has more significant psychiatric issues including bipolar disorder with multiple inpatient hospitalizations and ECT treatments,  her son with a diagnosis of autistic spectrum disorder/Asperger's syndrome and her daughter who was diagnosed with anxiety disorder/OCD and attentional deficits.  Family Med/Psych History:  Family History  Problem Relation Age of Onset   Thyroid disease Mother    Heart disease Mother    Diabetes Mother    Asthma Father    Stroke Father    Cancer - Colon Other    Memory loss Neg Hx    Breast cancer Neg Hx     Impression/DX:  Alyssa Holmes is a 56 year old female who is referred for neuropsychological evaluation by Sarina Ill, MD who is her treating neurologist.  She was initially referred for neurological work-up by the patient's PCP Ammie Dalton, PA-C.  The patient describes a roughly 10-year history of memory difficulties and word finding difficulties along with confusion that she feels is progressive in nature but clearly very slowly progressing.  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  The patient also has a prior psychiatric history including diagnosis of bipolar affective disorder that has been regularly and effectively managed psychotropic Lee, anxiety and long-term sleep disturbance.  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  Disposition/Plan:  We have set the patient up for formal neuropsychological testing to provide an objective assessment of a wide range of cognitive abilities including memory, expressive language as well as other cognitive domains.  She will complete the foundational battery of the Wechsler Adult Intelligence Scale-IV as well as the Wechsler Memory Scale's and we will also administered the control oral Word Association test.  Once these are completed a determination will be made as to potential need for any other further objective assessment.  After this is completed a formal report will be produced and made available to her referring physician as well as being available in her EMR for her other care providers.  I will also schedule a time to sit down with the patient and go over the results with diagnostic and treatment recommendations findings.  Diagnosis:    Cognitive changes  Memory loss  Word finding difficulty  REM behavioral disorder  Dream enactment behavior         Electronically Signed   _______________________ Ilean Skill, Psy.D. Clinical Neuropsychologist

## 2021-11-12 DIAGNOSIS — R413 Other amnesia: Secondary | ICD-10-CM | POA: Diagnosis not present

## 2021-11-12 DIAGNOSIS — R4189 Other symptoms and signs involving cognitive functions and awareness: Secondary | ICD-10-CM

## 2021-11-12 DIAGNOSIS — R4789 Other speech disturbances: Secondary | ICD-10-CM | POA: Diagnosis not present

## 2021-11-12 DIAGNOSIS — G4752 REM sleep behavior disorder: Secondary | ICD-10-CM | POA: Diagnosis not present

## 2021-11-12 NOTE — Progress Notes (Signed)
Behavioral Observations The patient was well-groomed and appropriately dressed. Her manners were polite and appropriate to the situation. Her effort and attitude toward testing were good, although she did mention that the test was "brutal".   Neuropsychology Note  Alyssa Holmes completed 180 minutes of neuropsychological testing with technician, Alyssa Holmes, BA, under the supervision of Alyssa Skill, PsyD., Clinical Neuropsychologist. The patient did not appear overtly distressed by the testing session, per behavioral observation or via self-report to the technician. Rest breaks were offered.   Clinical Decision Making: In considering the patient's current level of functioning, level of presumed impairment, nature of symptoms, emotional and behavioral responses during clinical interview, level of literacy, and observed level of motivation/effort, a battery of tests was selected by Dr. Sima Holmes during initial consultation on 11/06/2021. This was communicated to the technician. Communication between the neuropsychologist and technician was ongoing throughout the testing session and changes were made as deemed necessary based on patient performance on testing, technician observations and additional pertinent factors such as those listed above.  Tests Administered: Controlled Oral Word Association Test (COWAT; FAS & Animals)  Wechsler Adult Intelligence Scale, 4th Edition (WAIS-IV) Wechsler Memory Scale, 4th Edition (WMS-IV); Adult Battery    Results:  COWAT FAS total = 52 Z = 1.07 Animals total = 21 Z = 0.29       WAIS-IV  Composite Score Summary  Scale Sum of Scaled Scores Composite Score Percentile Rank 95% Conf. Interval Qualitative Description  Verbal Comprehension 32 VCI 103 58 97-109 Average  Perceptual Reasoning 33 PRI 105 63 99-111 Average  Working Memory 13 WMI 80 9 74-88 Low Average  Processing Speed 22 PSI 105 63 96-113 Average  Full Scale 100 FSIQ 100  50 96-104 Average  General Ability 65 GAI 104 61 99-109 Average       Verbal Comprehension Subtests Summary  Subtest Raw Score Scaled Score Percentile Rank Reference Group Scaled Score SEM  Similarities 25 10 50 10 1.08  Vocabulary 38 10 50 11 0.73  Information 18 12 75 13 0.67  (Comprehension) 24 10 50 10 1.08        Perceptual Reasoning Subtests Summary  Subtest Raw Score Scaled Score Percentile Rank Reference Group Scaled Score SEM  Block Design '24 7 16 6 '$ 1.04  Matrix Reasoning 19 12 75 10 0.95  Visual Puzzles 17 14 91 11 0.99  (Figure Weights) 16 13 84 10 0.99  (Picture Completion) 12 10 50 9 1.12        Working Doctor, general practice Raw Score Scaled Score Percentile Rank Reference Group Scaled Score SEM  Digit Span '18 6 9 5 '$ 0.85  Arithmetic '10 7 16 7 '$ 1.04  (Letter-Number Seq.) '16 8 25 7 '$ 1.08        Processing Speed Subtests Summary  Subtest Raw Score Scaled Score Percentile Rank Reference Group Scaled Score SEM  Symbol Search 26 9 37 7 1.31  Coding 75 13 84 10 0.99  (Cancellation) 38 10 50 9 1.34        WMS-IV  Index Score Summary  Index Sum of Scaled Scores Index Score Percentile Rank 95% Confidence Interval Qualitative Descriptor  Auditory Memory (AMI) 48 112 79 105-118 High Average  Visual Memory (VMI) 35 92 30 87-98 Average  Visual Working Memory (VWMI) 17 91 27 84-99 Average  Immediate Memory (IMI) 41 102 55 96-108 Average  Delayed Memory (DMI) 42 104 61 97-111 Average       Primary Subtest Scaled  Score Summary  Subtest Domain Raw Score Scaled Score Percentile Rank  Logical Memory I AM 22 9 37  Logical Memory II AM 20 10 50  Verbal Paired Associates I AM 50 16 98  Verbal Paired Associates II AM 12 13 84  Designs I VM 62 9 37  Designs II VM 60 12 75  Visual Reproduction I VM '28 7 16  '$ Visual Reproduction II VM '11 7 16  '$ Spatial Addition VWM '8 8 25  '$ Symbol Span VWM 20 9 37       Auditory Memory Process Score  Summary  Process Score Raw Score Scaled Score Percentile Rank Cumulative Percentage (Base Rate)  LM II Recognition 27 - - >75%  VPA II Recognition 40 - - >75%        Visual Memory Process Score Summary  Process Score Raw Score Scaled Score Percentile Rank Cumulative Percentage (Base Rate)  DE I Content 40 14 91 -  DE I Spatial '10 5 5 '$ -  DE II Content 32 10 50 -  DE II Spatial 16 13 84 -  DE II Recognition 13 - - 26-50%  VR II Recognition 4 - - 17-25%       ABILITY-MEMORY ANALYSIS  Ability Score:  GAI: 104 Date of Testing:  WAIS-IV; WMS-IV 2021/11/12  Predicted Difference Method   Index Predicted WMS-IV Index Score Actual WMS-IV Index Score Difference Critical Value  Significant Difference Y/N Base Rate  Auditory Memory 102 112 -10 8.95 Y 20-25%  Visual Memory 102 92 10 8.82 Y 20-25%  Visual Working Memory 103 91 12 11.24 Y 15%  Immediate Memory 103 102 1 10.35 N   Delayed Memory 102 104 -2 10.08 N   Statistical significance (critical value) at the .01 level.        Feedback to Patient: Alyssa Holmes will return on 04/03/2022 for an interactive feedback session with Dr. Sima Holmes at which time her test performances, clinical impressions and treatment recommendations will be reviewed in detail. The patient understands she can contact our office should she require our assistance before this time.  180 minutes spent face-to-face with patient administering standardized tests, 30 minutes spent scoring Alyssa Holmes education officer). [CPT [id number], [id number]]  Full report to follow.

## 2021-11-21 ENCOUNTER — Encounter: Payer: Self-pay | Admitting: Cardiovascular Disease

## 2021-11-21 NOTE — Progress Notes (Signed)
Cardiology Office Note:    Date:  11/22/2021   ID:  Alyssa Holmes, DOB May 06, 1966, MRN [id number]  PCP:  Ramiro Harvest PA-C  Cardiologist:  Heatherly Stenner  Electrophysiologist:  None   Referring MD: Ramiro Harvest, PA-C   Chief Complaint  Patient presents with   Palpitations    Aug. 19, 2020     Alyssa Holmes is a 56 y.o. female with a hx of MVP and palpitations She was seen via telemedicine in April, 2020 She has a hx of MVP.   Started having palpitations several months ago.  Not related to exercise Clinically the palpitations sound c/w PVCs. Advised increasing her intake of potassium ( V8)  Advised regular exercise  Still has occasional palps  Is not exercising ,    Has occasional hot flashes Is on synthroid - TSH 3 months ago was normal .   Alyssa Holmes is seen today for follow up of her MVP and episodes o fpalpitations    May 10, 2019:  Has mvp with palpitations  we gave her metoprolol 25 mg bid  She is taking only once a day  - she cut the dose due to fatigue and constipation .  She has started exercising   Jan. 31, 2022:  Alyssa Holmes is seen today for follow  Up of her MVP and palpitations She was last seen in Nov. 2020 No CP , no palpitations Palpitations are better on the toprol 25 mg a day  Trying to exercise   June, 9, 2023 Alyssa Holmes is seen today for follow up of her MVP and palpitations Has been having some memory issues  No CP , no dyspne  Some exercise    Past Medical History:  Diagnosis Date   Anxiety    Bipolar disorder ([place])    Bundle branch block    Constipation    Depression    Elevated cholesterol    dx in early 53s   Endometriosis    Fatty liver    Female bladder prolapse    Fibroid    Headache(784.0)    Hyperlipidemia    Hypothyroidism    Mitral valve prolapse    during adolescence   OSA (obstructive sleep apnea)    mild (HST 05/2018 ESS 15 AHI-10/hr, 02 min-82%) dream enactment behavior- improved on APAP    Ovarian torsion    Pancreatitis    Pituitary macroadenoma (HCC)    Shortness of breath     Past Surgical History:  Procedure Laterality Date   ABDOMINAL HYSTERECTOMY     APPENDECTOMY     CHOLECYSTECTOMY     COLONOSCOPY     DILATION AND CURETTAGE OF UTERUS     ESOPHAGOGASTRODUODENOSCOPY (EGD) WITH PROPOFOL N/A 09/09/2013   Procedure: ESOPHAGOGASTRODUODENOSCOPY (EGD) WITH PROPOFOL;  Surgeon: Arta Silence, MD;  Location: [facility] ENDOSCOPY;  Service: Endoscopy;  Laterality: N/A;   EUS N/A 09/09/2013   Procedure: ESOPHAGEAL ENDOSCOPIC ULTRASOUND (EUS) RADIAL;  Surgeon: Arta Silence, MD;  Location: [facility] ENDOSCOPY;  Service: Endoscopy;  Laterality: N/A;   HERNIA REPAIR     LAPAROSCOPY Left 11/15/2013   Procedure: LAPAROSCOPY OPERATIVE WITH LEFT SALPINGO-OOPHORECTOMY/LYSIS OF ADHESIONS/REMOVAL OF LEFT OVARIAN CYST;  Surgeon: Betsy Coder, MD;  Location: [facility];  Service: Gynecology;  Laterality: Left;   OOPHORECTOMY     pilonidal cyst-age 67      Current Medications: Current Meds  Medication Sig   acetaminophen (TYLENOL) 500 MG tablet Take 1,000 mg by mouth every 6 (six) hours as needed for mild  pain.   B Complex-C (B-COMPLEX WITH VITAMIN C) tablet Take 1 tablet by mouth daily.   Biotin 5000 MCG CAPS Take 1 capsule by mouth daily.   BIOTIN PO Take 1 tablet by mouth daily.   Cholecalciferol (VITAMIN D3) 125 MCG (5000 UT) CAPS Take 2 capsules by mouth daily.   Cholecalciferol (VITAMIN D3) 2000 UNITS TABS Take 2 tablets by mouth daily.    lamoTRIgine (LAMICTAL) 200 MG tablet Take 1 tablet by mouth every evening.   levothyroxine (SYNTHROID) 88 MCG tablet Take 88 mcg by mouth daily.   loratadine (CLARITIN) 10 MG tablet Take 1 tablet by mouth as needed.   MAGNESIUM PO Take 1 tablet by mouth daily.   metoprolol succinate (TOPROL XL) 25 MG 24 hr tablet Take 1 tablet (25 mg total) by mouth daily.   metroNIDAZOLE (METROCREAM) 0.75 % cream Apply 1 application topically as needed.   NIACIN PO Take  500 mg by mouth daily.    venlafaxine XR (EFFEXOR-XR) 150 MG 24 hr capsule Take 1 capsule by mouth daily.   [DISCONTINUED] metoprolol succinate (TOPROL XL) 25 MG 24 hr tablet Take 1 tablet (25 mg total) by mouth 2 (two) times daily.     Allergies:   Vancomycin, Ibuprofen, Oxycodone-acetaminophen, Penicillins, and Toradol [ketorolac tromethamine]   Social History   Socioeconomic History   Marital status: Married    Spouse name: Not on file   Number of children: Not on file   Years of education: Not on file   Highest education level: Not on file  Occupational History   Not on file  Tobacco Use   Smoking status: Former    Types: Cigarettes   Smokeless tobacco: Never  Vaping Use   Vaping Use: Never used  Substance and Sexual Activity   Alcohol use: Yes    Alcohol/week: 2.0 standard drinks of alcohol    Types: 2 Glasses of wine per week    Comment: Just on Weekends   Drug use: No   Sexual activity: Yes    Birth control/protection: Post-menopausal  Other Topics Concern   Not on file  Social History Narrative   Not on file   Social Determinants of Health   Financial Resource Strain: Not on file  Food Insecurity: Not on file  Transportation Needs: Not on file  Physical Activity: Not on file  Stress: Not on file  Social Connections: Not on file     Family History: The patient's family history includes Asthma in her father; Cancer - Colon in an other family member; Diabetes in her mother; Heart disease in her mother; Stroke in her father; Thyroid disease in her mother. There is no history of Memory loss or Breast cancer.  ROS:   Please see the history of present illness.     All other systems reviewed and are negative.  EKGs/Labs/Other Studies Reviewed:    The following studies were reviewed today:   EKG:   November 22, 2021: Normal sinus rhythm at 70.  Right bundle branch block.  Recent Labs: 12/11/2020: BUN 12; Creatinine, Ser 0.79; Hemoglobin 13.8; Platelets 302;  Potassium 3.8; Sodium 138  Recent Lipid Panel    Component Value Date/Time   CHOL 218 (H) 09/06/2013 1318   TRIG 403 (H) 09/09/2013 1312   HDL 30 (L) 09/06/2013 1318   CHOLHDL 7.3 09/06/2013 1318   VLDL UNABLE TO CALCULATE IF TRIGLYCERIDE OVER 400 mg/dL 09/06/2013 1318   LDLCALC UNABLE TO CALCULATE IF TRIGLYCERIDE OVER 400 mg/dL 09/06/2013 1318  Physical Exam:     Physical Exam: Blood pressure 92/75, pulse 69, height '5\' 5"'$  (1.651 m), weight 159 lb (72.1 kg), SpO2 98 %.  GEN:  Well nourished, well developed in no acute distress HEENT: Normal NECK: No JVD; No carotid bruits LYMPHATICS: No lymphadenopathy CARDIAC: RRR , no murmurs, rubs, gallops RESPIRATORY:  Clear to auscultation without rales, wheezing or rhonchi  ABDOMEN: Soft, non-tender, non-distended MUSCULOSKELETAL:  No edema; No deformity  SKIN: Warm and dry NEUROLOGIC:  Alert and oriented x 3    ASSESSMENT:    1. Palpitations    PLAN:      Tachycardia:   Palpitations are well controlled.  She is currently taking Toprol-XL once a day.  We will reduce the dose in our chart to reflect that change.  She is having some memory issues.  She thinks that the memory was worse on Toprol 25 mg twice a day.  Hopefully she can continue to take the daily metoprolol XL     We will see her again in 1 year.  Medication Adjustments/Labs and Tests Ordered: Current medicines are reviewed at length with the patient today.  Concerns regarding medicines are outlined above.  Orders Placed This Encounter  Procedures   EKG 12-Lead   Meds ordered this encounter  Medications   metoprolol succinate (TOPROL XL) 25 MG 24 hr tablet    Sig: Take 1 tablet (25 mg total) by mouth daily.    Dispense:  90 tablet    Refill:  3    Dose DECREASE     Patient Instructions  Medication Instructions:  DECREASE Metoprolol Succinate (Toprol XL) to '25mg'$  daily *If you need a refill on your cardiac medications before your next appointment,  please call your pharmacy*   Lab Work: NONE If you have labs (blood work) drawn today and your tests are completely normal, you will receive your results only by: [place] (if you have MyChart) OR A paper copy in the mail If you have any lab test that is abnormal or we need to change your treatment, we will call you to review the results.   Testing/Procedures: NONE   Follow-Up: At [facility], you and your health needs are our priority.  As part of our continuing mission to provide you with exceptional heart care, we have created designated Provider Care Teams.  These Care Teams include your primary Cardiologist (physician) and Advanced Practice Providers (APPs -  Physician Assistants and Nurse Practitioners) who all work together to provide you with the care you need, when you need it.   Your next appointment:   1 year(s)  The format for your next appointment:   In Person  Provider:   Lysle Pearl {  Important Information About Sugar         Signed, Mertie Moores, MD  11/22/2021 4:32 PM    [facility]

## 2021-11-22 ENCOUNTER — Encounter: Payer: Self-pay | Admitting: Cardiovascular Disease

## 2021-11-22 ENCOUNTER — Ambulatory Visit: Payer: Federal, State, Local not specified - PPO | Admitting: Cardiovascular Disease

## 2021-11-22 VITALS — BP 92/75 | HR 69 | Ht 65.0 in | Wt 159.0 lb

## 2021-11-22 DIAGNOSIS — R002 Palpitations: Secondary | ICD-10-CM | POA: Diagnosis not present

## 2021-11-22 MED ORDER — METOPROLOL SUCCINATE ER 25 MG PO TB24: 25.0000 mg | ORAL_TABLET | Freq: Every day | ORAL | 3 refills | Status: DC

## 2021-11-22 NOTE — Patient Instructions (Signed)
Medication Instructions:  DECREASE Metoprolol Succinate (Toprol XL) to '25mg'$  daily *If you need a refill on your cardiac medications before your next appointment, please call your pharmacy*   Lab Work: NONE If you have labs (blood work) drawn today and your tests are completely normal, you will receive your results only by: [place] (if you have MyChart) OR A paper copy in the mail If you have any lab test that is abnormal or we need to change your treatment, we will call you to review the results.   Testing/Procedures: NONE   Follow-Up: At [facility], you and your health needs are our priority.  As part of our continuing mission to provide you with exceptional heart care, we have created designated Provider Care Teams.  These Care Teams include your primary Cardiologist (physician) and Advanced Practice Providers (APPs -  Physician Assistants and Nurse Practitioners) who all work together to provide you with the care you need, when you need it.   Your next appointment:   1 year(s)  The format for your next appointment:   In Person  Provider:   Jacklynn Lewis, [name] Mody {  Important Information About Sugar

## 2021-11-26 ENCOUNTER — Encounter: Payer: Federal, State, Local not specified - PPO | Attending: Psychology | Admitting: Psychology

## 2021-11-26 ENCOUNTER — Encounter: Payer: Self-pay | Admitting: Psychology

## 2021-11-26 DIAGNOSIS — R4189 Other symptoms and signs involving cognitive functions and awareness: Secondary | ICD-10-CM | POA: Diagnosis not present

## 2021-11-26 DIAGNOSIS — F3175 Bipolar disorder, in partial remission, most recent episode depressed: Secondary | ICD-10-CM | POA: Insufficient documentation

## 2021-11-26 DIAGNOSIS — G4752 REM sleep behavior disorder: Secondary | ICD-10-CM | POA: Diagnosis not present

## 2021-11-26 NOTE — Progress Notes (Signed)
Neuropsychological Evaluation   Patient:  Alyssa Holmes   DOB: 01/24/1966  MR Number: [id number]  Location: [facility] [place], [place] [id number] [place] [id number] Dept: [phone]  Start: 10 AM End: 11 AM  Provider/Observer:     Alyssa Roys PsyD  Chief Complaint:      Chief Complaint  Patient presents with   Memory Loss   Other    Times of confusion and difficulty with expressive language and word finding    Reason For Service:      Alyssa Holmes is a 56 year old female who is referred for neuropsychological evaluation by Alyssa Ill, MD, who is her treating neurologist.  She was initially referred for neurological work-up by the patient's PCP Alyssa Dalton, PA-C.  The patient describes a roughly 10-year history of memory difficulties and word finding difficulties along with confusion that she feels is progressive in nature but clearly very slowly progressing.  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  The patient also has a prior psychiatric history including diagnosis of bipolar affective disorder that has been regularly and effectively managed with psychotropic medications (currently taking Lamictal and has taken Effexor in the past), anxiety and long-term sleep disturbance.  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  Other past medical history including an ovarian cyst, appendix issues, gallbladder removal, hysterectomy.  She has participated in physical therapy for neck and shoulder pain.  The patient also went through speech and language therapy with Corinda Gubler,  [facility] and derive some benefit from these interventions but plateaued and these efforts appear to of been completed.  The patient describes a roughly 10-year history of memory difficulties including forgetting day-to-day information as well as changes in verbal fluency with word finding difficulties.  The patient describes difficulties with confusion when people are trying to give her multiple information and questions at one time, difficulty understanding what is being told to her, difficulty finishing sentences and word finding issues as well as difficulty making sense of verbal directions being given to her.  The patient reports that the symptoms have been going on for roughly 10 years and she feels like her symptoms have gotten worse over time.  She describes difficulty recalling short-term memory types of information and difficulty understanding verbal directions.  The patient does report that if someone reminds her of a particular instance that she has not recalled that it will "come back to her."  She will spend a good bit of time trying to and searching for people's names.  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  She reports that there are some times where she will slur her words and stop midsentence.  The patient reports that her family is distressed reminding her of conversations that they have had previously and family worries about her being so young and having memory problems.  She reports that she feels like it is slowly getting worse.  The patient denies any geographic disorientation, tremors or auditory or visual hallucinations.  As far as sleep disturbance, the patient has been diagnosed with obstructive sleep apnea and compliantly and regularly uses her CPAP machine.  The patient reports that she has  intrusive or ruminating thoughts at night when she tries to go to sleep.  The patient has been diagnosed with dream enhancement disorder and describes symptoms  consistent with REM behavioral disorder.  She reports that she has very vivid dreams that she remembers quite clearly long after she wakes up.  The patient has had an MRI conducted in both 2017 as well as repeat MRI conducted in 2022.  These were done because of recurrent headache that are preorbital in nature that has been going on for the past 6 months for the 2022 MRI.  Patient with history of pituitary micro adenoma in 2017.  The MRI was compared to 2017 and interpreted as a normal MRI of brain as well as pituitary gland.  There were no indications of infarct, hemorrhage or hydrocephalus.  There was noted small remote cerebral white matter insults without progression from 2017 that were described as typically related to prior ischemia, infection, demyelinization or trauma events.  The patient has a history of 1 concussive event when she was a teen with brief loss of consciousness.  Patient denies any seizures, hallucinations or delusions.  The patient denies any family history of any progressive neurological disorders although there is a family history of bipolar disorder particularly for her father.  The patient's sister also has a history of bipolar affective disorder and received ECT treatment in the past as well as having psychiatric hospitalization and multiple medication interventions.  The patient's father is described as having significant manic episodes with significant anger issues.  The patient's father did develop cognitive deficits later in life that were attributed to repeated stroke events.  Tests Administered: Controlled Oral Word Association Test (COWAT; FAS & Animals)  Wechsler Adult Intelligence Scale, 4th Edition (WAIS-IV) Wechsler Memory Scale, 4th Edition (WMS-IV); Adult Battery   Participation Level:   Active  Participation Quality:  Appropriate      Behavioral Observation:  The patient was well-groomed and appropriately dressed. Her manners were polite and appropriate to  the situation. Her effort and attitude toward testing were good, although she did mention that the test was "brutal".   Well Groomed, Alert, and Appropriate.   Test Results:   Initially, an estimation was made as to the patient's premorbid intellectual and cognitive functioning looking at variables such as psychosocial components, education and occupational history.  This estimation will be used for comparison points to current obtained cognitive measures.  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  She is also worked in Brewing technologist.  It is estimated that the patient's likely premorbid intellectual and cognitive functioning would be somewhere at the upper end of the average to high average range and we will utilize an estimate roughly around 100-110 as a conservative comparison point for current achieved variables.  The patient likely performed higher than that in some individual domains.  COWAT FAS total = 52 Z = 1.07 Animals total = 21 Z = 0.29     Initially, the patient was administered the control oral Word Association test to obtain an objective assessment of both lexical fluency and semantic fluency as the patient has been describing difficulties with word finding as part of her subjective symptoms.  On the FAS test which assesses lexical fluency the patient did very well in fact performed roughly 1 standard deviation above her normative comparison group.  This is a  very good score and does not suggest any difficulties with lexical fluency.  The patient also did well and performed commensurate with her normative comparison group related to education and occupational history on semantic fluency measures (animal naming).  Overall, there were not any objective findings of significant expressive language deficits.       WAIS-IV             Composite  Score Summary        Scale Sum of Scaled Scores Composite Score Percentile Rank 95% Conf. Interval Qualitative Description  Verbal Comprehension 32 VCI 103 58 97-109 Average  Perceptual Reasoning 33 PRI 105 63 99-111 Average  Working Memory 13 WMI 80 9 74-88 Low Average  Processing Speed 22 PSI 105 63 96-113 Average  Full Scale 100 FSIQ 100 50 96-104 Average  General Ability 65 GAI 104 61 99-109 Average      The patient was then administered the Wechsler Adult Intelligence Scale-IV to provide an objective assessment of a wide range of different cognitive variables.  While the patient is describing changes in cognition and memory the current score should not be used to describe her lifelong history but as a description of her current cognitive functioning.  We calculated to global composite scores to provide a broad look at current functioning versus premorbid estimations.  The patient produced a full-scale IQ score of 100 which falls at the 50th percentile and is in the average range.  This is equal to or only slightly below predicted levels of premorbid functioning and would suggest no more than 1 cognitive domain to be impacted.  We also calculated the patient's general abilities index score which places less emphasis on working memory and information processing speed's.  The patient produced a general abilities index score of 104 which falls at the 61st percentile and is in the average range and is only slightly below or consistent with premorbid predictions.  Essentially, the patient showed 1 area of cognitive deficits relative to premorbid estimations and that had to do with measures of auditory encoding.  The patient's verbal based skills, visual-spatial based skills and focus execute abilities/information processing speeds were all generally consistent with premorbid estimates.             Verbal Comprehension Subtests Summary     Subtest Raw Score Scaled Score Percentile Rank Reference  Group Scaled Score SEM  Similarities 25 10 50 10 1.08  Vocabulary 38 10 50 11 0.73  Information 18 12 75 13 0.67  (Comprehension) 24 10 50 10 1.08    The patient produced a verbal comprehension index score of 103 which falls at the 58th percentile and is in the average range.  While this is slightly below predicted levels there was little or no variability within subtest performance.  All of her measures in this domain were in the average range and the patient had relative strengths with regard to her general fund of information likely reflexive of her history of hobbies related to reading regularly.  The patient performed in the average range with regard to verbal reasoning and problem-solving, vocabulary knowledge, general fund of information and social judgment comprehension.              Perceptual Reasoning Subtests Summary    Subtest Raw Score Scaled Score Percentile Rank Reference Group Scaled Score SEM  Block Design '24 7 16 6 '$ 1.04  Matrix Reasoning 19 12 75 10 0.95  Visual Puzzles 17 14 91 11 0.99  (Figure  Weights) 16 13 84 10 0.99  (Picture Completion) 12 10 50 9 1.12      The patient produced a perceptual reasoning index score of 105 which falls at the 63rd percentile and is in the average range relative to a normative population.  This suggest that the patient's visual spatial visual constructional abilities are in the average range.  While she did have some difficulties on 1 individual subtest related to visual analysis and organizational abilities this is a time based measure and likely reflect did her difficulty with rapid information processing speed as the patient's other performances in the same domain were some of her highest levels of performance.  The patient did very well on measures of visual reasoning and problem-solving, visual estimation and visual predictive ability.  The patient also performed in the average range with regard to her ability to identify visual anomalies  within a visual gestalt.               Working Optician, dispensing Raw Score Scaled Score Percentile Rank Reference Group Scaled Score SEM  Digit Span '18 6 9 5 '$ 0.85  Arithmetic '10 7 16 7 '$ 1.04  (Letter-Number Seq.) '16 8 25 7 '$ 1.08      The patient produced a working memory index score of 80 which falls at the 9th percentile and is in the low average range.  This level of performance is more than 20 standard points below predicted levels of performance and does suggest specific difficulties with auditory encoding and her ability to actively process information in her active auditory register.  There was great consistency between subtest performance although the patient did a little bit better with her ability to process information and most of the deficits appear to be primarily related to her fundamental aspect of encoding capacity.               Processing Speed Subtests Summary     Subtest Raw Score Scaled Score Percentile Rank Reference Group Scaled Score SEM  Symbol Search 26 9 37 7 1.31  Coding 75 13 84 10 0.99  (Cancellation) 38 10 50 9 1.34      The patient produced a processing speed index score of 105 which falls at the 63rd percentile and is in the average range relative to normative population.  This again, is generally consistent with predicted levels of premorbid functioning and in fact on one of the 3 subtest she did very well performing at the 84th percentile while the 2 other subtest were in the average range.  This variability does suggest some alteration in focus execute abilities and attention rather than a fundamental difficulty with her ability to visually scan and process information effectively.  Her information processing speed is within normal limits.       WMS-IV           Index Score Summary       Index Sum of Scaled Scores Index Score Percentile Rank 95% Confidence Interval Qualitative Descriptor  Auditory Memory (AMI) 48 112 79 105-118 High  Average  Visual Memory (VMI) 35 92 30 87-98 Average  Visual Working Memory (VWMI) 17 91 27 84-99 Average  Immediate Memory (IMI) 41 102 55 96-108 Average  Delayed Memory (DMI) 42 104 61 97-111 Average      The patient was then administered the Wechsler Memory Scale-IV to provide an objective assessment of multiple domains of memory and learning.  On the Wechsler Adult Intelligence Scale,  the patient showed specific weaknesses and difficulties with regard to auditory encoding and she displayed a similar pattern of difficulties with visual encoding on the Wechsler Memory Scale's.  This pattern would suggest that one of the primary deficits with the patient cognitively have to do with both auditory and visual encoding capacity.  This level of auditory and visual encoding capacities would have a negative impact on her patient's ability to learn and remember new information.  Breaking the patient's memory functions down between auditory versus visual memory the patient did very well on the auditory memory components where she produced a auditory memory index score of 112 which falls at the 79th percentile and is in the high average range.  The patient produced a visual memory index score of 92 which is in the average range and is consistent with what we would expect based on her visual working memory/visual encoding capacity.  The patient did very well on auditory memory tasks versus visual memory task although this might be related to her overcoming some of the auditory encoding weaknesses more effectively than the visual memory encoding weaknesses.  Breaking memory functions down between immediate versus delayed the patient produced an immediate memory index score of 102 which falls at the 55th percentile and is in the average range.  She produced similar performances on delayed memory as she produced a delayed memory index score of 104 falling at the 61st percentile.  The patient did very well on  recognition/cued recall formats as well.  This pattern of performances on various memory and learning domains would suggest that the primary subjective experiences of memory difficulties are likely directly related to impairments with regard to auditory and visual encoding capacity and difficulty with retrieval of information rather than an inability to store organize and retain information over a period of delay.  Subjective memory difficulties appear to be primarily related to attentional variables and retrieval variables rather than a fundamental deficit with her ability to store and organize new information.            Primary Subtest Scaled Score Summary     Subtest Domain Raw Score Scaled Score Percentile Rank  Logical Memory I AM 22 9 37  Logical Memory II AM 20 10 50  Verbal Paired Associates I AM 50 16 98  Verbal Paired Associates II AM 12 13 84  Designs I VM 62 9 37  Designs II VM 60 12 75  Visual Reproduction I VM '28 7 16  '$ Visual Reproduction II VM '11 7 16  '$ Spatial Addition VWM '8 8 25  '$ Symbol Span VWM 20 9 37                  Auditory Memory Process Score Summary      Process Score Raw Score Scaled Score Percentile Rank Cumulative Percentage (Base Rate)  LM II Recognition 27 - - >75%  VPA II Recognition 40 - - >75%               Visual Memory Process Score Summary      Process Score Raw Score Scaled Score Percentile Rank Cumulative Percentage (Base Rate)  DE I Content 40 14 91 -  DE I Spatial '10 5 5 '$ -  DE II Content 32 10 50 -  DE II Spatial 16 13 84 -  DE II Recognition 13 - - 26-50%  VR II Recognition 4 - - 17-25%     Summary of Results:   Overall, the results  of the current neuropsychological evaluation do point out that the primary cognitive deficit noted on a wide range of various cognitive and memory components were related to attentional variables specifically around auditory and visual encoding weaknesses in her ability to retrieve information.  There were  no indications of any visual spatial or visual constructional deficits and in fact she did quite well on some of these components.  The patient's visual reasoning and problem-solving and verbal reasoning and problem-solving were all within normal limits.  While she may have performed somewhat below predicted levels the deficits were all around attentional components related to encoding information and retrieval rather than fundamental deficits of memory and learning capacity.  The patient also did well on measures of visual scanning, visual searching and overall speed of mental operations.  Impression/Diagnosis:   Overall, the results of the current neuropsychological evaluation are very encouraging.  There were specific cognitive deficits noted on the objective neuropsychological testing but these were all around very discrete and specific domains of auditory and visual encoding and free recall/retrieval of information.  The patient showed very good performance under recognition/cued recall for both auditory and visual learning and memory.  The patient also did very well on auditory memory components specifically.  The patient showed no specific deficits related to visual spatial/visual constructional abilities, information processing speed and visual scanning/visual searching components, and well-preserved reasoning and problem-solving both visually and auditorily.  Memory and learning were within normal limits with the exception of her primary deficit around attention and encoding variables.  The patient does have a history of depression and previous diagnosis of bipolar affective disorder with affective psychotropic management.  The patient has a history of obstructive sleep apnea and is diligent in her CPAP use but does continue to have significant sleep disturbance including REM behavioral disorder as well as difficulty with onset and maintenance of sleep.  She has very vivid dreams that tend to wake her and  then deals with intrusive thoughts etc. creating difficulty going to sleep or returning to sleep after waking.  As far as diagnostic variables, the pattern of symptoms with long duration of symptoms, cognitive strengths and weaknesses objectively assessed here are all consistent with likely direct impacts of her ongoing and continued significant sleep disturbance, underlying psychiatric issues related to bipolar affective disorder and some possible impact of psychiatric medication she is taking for bipolar disorder (Lamictal).  These variables are likely creating the negative impact on her encoding capacity which are producing her day-to-day symptoms.  Anxiety, depression and bipolar disorder type symptoms are likely a significant contributing factor along with ongoing significant sleep disorder.  The patient does not have any pattern related to a progressive neurological disorder such as Alzheimer's Lewy body or other degenerative conditions.  The patient has no reports of any auditory or visual hallucinations, tremors, changes in visual-spatial geographic orientation etc.  I will sit down with the patient and go over the results of the current neuropsychological evaluation in depth.  Primary focus of intervention should be around addressing the best we can her ongoing sleep disorder and continuing to manage her psychiatric status.  Diagnosis:    Subjective memory complaints  REM behavioral disorder  Bipolar disorder, in partial remission, most recent episode depressed ([place])  Dream enactment behavior   _____________________ Ilean Skill, Psy.D. Clinical Neuropsychologist

## 2021-12-10 DIAGNOSIS — E039 Hypothyroidism, unspecified: Secondary | ICD-10-CM | POA: Diagnosis not present

## 2021-12-12 LAB — CULTURE, URINE: Urine Culture, Routine: <10000

## 2021-12-18 ENCOUNTER — Encounter: Payer: Federal, State, Local not specified - PPO | Attending: Psychology | Admitting: Psychology

## 2021-12-18 DIAGNOSIS — R4189 Other symptoms and signs involving cognitive functions and awareness: Secondary | ICD-10-CM | POA: Diagnosis not present

## 2021-12-18 DIAGNOSIS — F3175 Bipolar disorder, in partial remission, most recent episode depressed: Secondary | ICD-10-CM

## 2021-12-18 DIAGNOSIS — G4752 REM sleep behavior disorder: Secondary | ICD-10-CM | POA: Diagnosis not present

## 2021-12-19 ENCOUNTER — Encounter: Payer: Self-pay | Admitting: Psychology

## 2021-12-19 NOTE — Progress Notes (Signed)
12/18/2021 2 PM-3 PM:  Today's visit was an in person visit that was conducted in my outpatient office with the patient myself present.  The patient was engaged and appeared to fully comprehend the discussions we had today and understand feedback of her neuropsychological evaluation.  Today was spent providing feedback regarding the recent neuropsychological evaluation.  This evaluation can be found in the patient's EMR in its entirety dated 11/26/2021.  Up I have included a copy of the summary/impressions below for convenience.  We reviewed the results of the recent objective neuropsychological assessment and the resulting diagnostic considerations.  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  Summary of Results:                        Overall, the results of the current neuropsychological evaluation do point out that the primary cognitive deficit noted on a wide range of various cognitive and memory components were related to attentional variables specifically around auditory and visual encoding weaknesses in her ability to retrieve information.  There were no indications of any visual spatial or visual constructional deficits and in fact she did quite well on some of these components.  The patient's visual reasoning and problem-solving and verbal reasoning and problem-solving were all within normal limits.  While she may have performed somewhat below predicted levels the deficits were all around attentional components related to encoding information and retrieval rather than fundamental deficits of memory and learning capacity.  The patient also did well on measures of visual scanning, visual  searching and overall speed of mental operations.   Impression/Diagnosis:                     Overall, the results of the current neuropsychological evaluation are very encouraging.  There were specific cognitive deficits noted on the objective neuropsychological testing but these were all around very discrete and specific domains of auditory and visual encoding and free recall/retrieval of information.  The patient showed very good performance under recognition/cued recall for both auditory and visual learning and memory.  The patient also did very well on auditory memory components specifically.  The patient showed no specific deficits related to visual spatial/visual constructional abilities, information processing speed and visual scanning/visual searching components, and well-preserved reasoning and problem-solving both visually and auditorily.  Memory and learning were within normal limits with the exception of her primary deficit around attention and encoding variables.  The patient does have a history of depression and previous diagnosis of bipolar affective disorder with affective psychotropic management.  The patient has a history of obstructive sleep apnea and is diligent in her CPAP use but does continue to have significant sleep disturbance including REM behavioral disorder as well as difficulty with onset and maintenance of sleep.  She has very vivid dreams that tend to wake her and then deals with intrusive thoughts etc. creating difficulty going to sleep or returning to sleep after waking.  As far as diagnostic variables, the pattern of symptoms with long duration of symptoms, cognitive strengths and weaknesses objectively assessed here are all consistent with likely direct impacts of her ongoing and continued significant sleep disturbance, underlying psychiatric issues related to bipolar affective  disorder and some possible impact of psychiatric medication she is taking for bipolar disorder  (Lamictal).  These variables are likely creating the negative impact on her encoding capacity which are producing her day-to-day symptoms.  Anxiety, depression and bipolar disorder type symptoms are likely a significant contributing factor along with ongoing significant sleep disorder.  The patient does not have any pattern related to a progressive neurological disorder such as Alzheimer's Lewy body or other degenerative conditions.  The patient has no reports of any auditory or visual hallucinations, tremors, changes in visual-spatial geographic orientation etc.  I will sit down with the patient and go over the results of the current neuropsychological evaluation in depth.  Primary focus of intervention should be around addressing the best we can her ongoing sleep disorder and continuing to manage her psychiatric status.   Diagnosis:                                Subjective memory complaints   REM behavioral disorder   Bipolar disorder, in partial remission, most recent episode depressed ([place])   Dream enactment behavior     _____________________ Ilean Skill, Psy.D. Clinical Neuropsychologist

## 2022-02-03 DIAGNOSIS — G4733 Obstructive sleep apnea (adult) (pediatric): Secondary | ICD-10-CM | POA: Diagnosis not present

## 2022-02-11 DIAGNOSIS — L659 Nonscarring hair loss, unspecified: Secondary | ICD-10-CM | POA: Diagnosis not present

## 2022-02-11 DIAGNOSIS — E039 Hypothyroidism, unspecified: Secondary | ICD-10-CM | POA: Diagnosis not present

## 2022-02-11 DIAGNOSIS — L853 Xerosis cutis: Secondary | ICD-10-CM | POA: Diagnosis not present

## 2022-02-11 DIAGNOSIS — E041 Nontoxic single thyroid nodule: Secondary | ICD-10-CM | POA: Diagnosis not present

## 2022-02-24 DIAGNOSIS — J069 Acute upper respiratory infection, unspecified: Secondary | ICD-10-CM | POA: Diagnosis not present

## 2022-02-24 DIAGNOSIS — H1033 Unspecified acute conjunctivitis, bilateral: Secondary | ICD-10-CM | POA: Diagnosis not present

## 2022-03-05 ENCOUNTER — Telehealth: Payer: Self-pay | Admitting: Neurology

## 2022-03-05 NOTE — Telephone Encounter (Signed)
..   Pt understands that although there may be some limitations with this type of visit, we will take all precautions to reduce any security or privacy concerns.  Pt understands that this will be treated like an in office visit and we will file with pt's insurance, and there may be a patient responsible charge related to this service. ? ?

## 2022-03-05 NOTE — Telephone Encounter (Signed)
Noted.  Go over neuropsych eval.

## 2022-03-06 ENCOUNTER — Telehealth (INDEPENDENT_AMBULATORY_CARE_PROVIDER_SITE_OTHER): Payer: Federal, State, Local not specified - PPO | Admitting: Neurology

## 2022-03-06 DIAGNOSIS — F3175 Bipolar disorder, in partial remission, most recent episode depressed: Secondary | ICD-10-CM

## 2022-03-06 DIAGNOSIS — G4733 Obstructive sleep apnea (adult) (pediatric): Secondary | ICD-10-CM

## 2022-03-06 DIAGNOSIS — G4752 REM sleep behavior disorder: Secondary | ICD-10-CM | POA: Diagnosis not present

## 2022-03-06 NOTE — Progress Notes (Signed)
[facility]   Virtual Visit via Video Note  I connected with Alyssa Holmes on 03/06/22 at  1:00 PM EDT by a video enabled telemedicine application and verified that I am speaking with the correct person using two identifiers.  Location: Patient: at her place of business Provider: [place], [address], my office   I discussed the limitations of evaluation and management by telemedicine and the availability of in person appointments. The patient expressed understanding and agreed to proceed.   Follow Up Instructions:    I discussed the assessment and treatment plan with the patient. The patient was provided an opportunity to ask questions and all were answered. The patient agreed with the plan and demonstrated an understanding of the instructions.   The patient was advised to call back or seek an in-person evaluation if the symptoms worsen or if the condition fails to improve as anticipated.  I provided over 25 minutes of non-face-to-face time during this encounter.   Melvenia Beam, MD   Provider:  Dr Jaynee Eagles Requesting Provider: Jamie Kato Primary Care Provider:  Ramiro Harvest, PA-C  CC:  memory loss  Interval history:  56 year old with anxiety, bipolar disorder, depression, fatty liver, hld, hypothyroidism, osa(mild on cpap) for cognitive difficulties/memory loss 10 years. Recent MRI brain unremarkable.    Discussed Dr. Ferne Coe assessment below from formal neurocognitive testing. She had a great experience. She is sleeping better with melatonin. She uses a cpap, I would suggest discussing REM Sleep disorder and vivid dreams with her sleep doctor and this is know to patient and sleep doctor and can be a pre-cursor to parkinsonian disease but at this time she has no signs or symptoms would monitor clinically. MRI of the brain with t2 hyperintensities(white matter spots) from my review, benign, can be microvascular changes or  sequelae of migraines and not progressive since 2017, Also discussed the white matter spots can be from vascular risk factors, work with pcp to keep close eye on things like cholesterol, blood pressure, glucose, sleep apnea and other vascular risk factors.   Discussed MRI brain 01/16/2021: IMPRESSION: (from my review, benign, can be microvascular changes or sequelae of migraines and not progressive)  1. No explanation for headache. 2. Normal MRI of the pituitary gland. 3. Small remote white matter insults which remain nonspecific and non progressed from 2017.  Also discussed white matter spots can be from vascular risk factors, work with pcp to keep close eye on things like cholesterol, blood pressure, glucose, sleep apnea and other vascular risk factors   (Dr. Ferne Coe assessment from formal memory testing: As far as diagnostic variables, the pattern of symptoms with long duration of symptoms, cognitive strengths and weaknesses objectively assessed here are all consistent with likely direct impacts of her ongoing and continued significant sleep disturbance, underlying psychiatric issues related to bipolar affective disorder and some possible impact of psychiatric medication she is taking for bipolar disorder (Lamictal).  These variables are likely creating the negative impact on her encoding capacity which are producing her day-to-day symptoms.  Anxiety, depression and bipolar disorder type symptoms are likely a significant contributing factor along with ongoing significant sleep disorder.  The patient does not have any pattern related to a progressive neurological disorder such as Alzheimer's Lewy body or other degenerative conditions.  The patient has no reports of any auditory or visual hallucinations, tremors, changes in visual-spatial geographic orientation etc.  Diagnosis(Dr. Sima Matas):   :  Subjective memory complaints   REM behavioral disorder   Bipolar  disorder, in partial remission, most recent episode depressed ([place])   Dream enactment behavior  Patient complains of symptoms per HPI as well as the following symptoms: rem sleep disorder . Pertinent negatives and positives per HPI. All others negative)   HPI:  Alyssa Holmes is a 56 y.o. female here as requested by Ramiro Harvest, PA-C for pituitary adenoma and headaches and memory loss. Today we will focus on the memory changes which bothr her the most, very difficult to address multiple things at one appointment and patient agrees.  She has a past medical history of pancreatic insufficiency, PCOS, hypothyroidism, pituitary adenoma, bipolar disorder, hyperlipidemia, chronic pancreatitis and epigastric abdominal pain, anxiety, dream enactment disorder, obstructive sleep apnea using CPAP very effectively, steatosis of liver and unspecified mood disorders.  Patient has been on antidepressants and antipsychotic medications for many years but her bipolar disorder is stable per review of notes.  Her dream enactment has improved on CPAP.  Memory changes started a long time ago, 10 years ago, a little bit frustrating and worse, forgetting a lot of common things, it is frustrating, slowly progressive, she can't remember short term or long term, she forgot something they did a year ago, if someone reminds her of the incident it'll come back to her, she does a lot of people ending her sentences, she has to search her memory for her name, she was diagnosed with dyslexia, it is affecting her job wise, if she doesn't have a pen in her hand she can't remember their names, simple things she should know at work she doesn't, she may slur her words or stops mid sentence. She has always had dream enactment, working with a cpap, having difficulty getting the words out, stumbling on her words, she will "lose it",  she makes notes and then forgets. Her son has adhd. No other focal neurologic deficits, associated  symptoms, inciting events or modifiable factors.  Reviewed notes, labs and imaging from outside physicians, which showed: I reviewed notes from Ammie Dalton, patient has concerns for memory loss but she scored a 20 out of 30 on her MoCA, symptoms may be consistent with mild mild cognitive impairment, in the past MRI showed punctate white matter lesions and she has a history of pituitary adenoma, last MRI from 2017 showed no obvious pituitary adenoma but she was sent to neurology with new onset headaches, memory concerns, vision changes.  Memory loss has been ongoing for a while, family members children and husband have voiced concerns, mostly short-term.  Difficulty with faces and names, performing simple tasks, word retrieval and difficulty forming full sentences at times, impacting her work and daily life, some dizziness and lightheadedness associated unsteadiness, also having some headaches with blurred vision, she saw an eye doctor which showed no abnormalities.  No family history of Alzheimer's or dementia, she denies any new life stressors or major life changes.  Labs reviewed include those collected November 21, 2020 CBC showed some mild anemia hemoglobin 12.4 and hematocrit 36.6 otherwise unremarkable, CMP normal with BUN 16 and creatinine 0.85.  MRI brain 01/16/2021:   EXAM: MRI HEAD WITHOUT AND WITH CONTRAST   TECHNIQUE: Multiplanar, multiecho pulse sequences of the brain and surrounding structures were obtained without and with intravenous contrast.   CONTRAST:  19m GADAVIST GADOBUTROL 1 MMOL/ML IV SOLN   COMPARISON:  05/31/2016   FINDINGS: Brain: Normal size and shape pituitary gland with homogeneous enhancement. No infarct,  hemorrhage, hydrocephalus, or collection. Small remote cerebral white matter insults without progression from 2017, usually related to prior ischemia, infection, demyelination, or trauma.   Vascular: Normal flow voids and vessel enhancement   Skull and upper  cervical spine: Normal marrow signal   Sinuses/Orbits: Negative.  No sinusitis.  IMPRESSION: 1. No explanation for headache. 2. Normal MRI of the pituitary gland. 3. Small remote white matter insults which remain nonspecific and non progressed from 2017.   Review of Systems: Patient complains of symptoms per HPI as well as the following symptoms memory loss. Pertinent negatives and positives per HPI. All others negative.   Social History   Socioeconomic History   Marital status: Married    Spouse name: Not on file   Number of children: Not on file   Years of education: Not on file   Highest education level: Not on file  Occupational History   Not on file  Tobacco Use   Smoking status: Former    Types: Cigarettes   Smokeless tobacco: Never  Vaping Use   Vaping Use: Never used  Substance and Sexual Activity   Alcohol use: Yes    Alcohol/week: 2.0 standard drinks of alcohol    Types: 2 Glasses of wine per week    Comment: Just on Weekends   Drug use: No   Sexual activity: Yes    Birth control/protection: Post-menopausal  Other Topics Concern   Not on file  Social History Narrative   Not on file   Social Determinants of Health   Financial Resource Strain: Not on file  Food Insecurity: Not on file  Transportation Needs: Not on file  Physical Activity: Not on file  Stress: Not on file  Social Connections: Not on file  Intimate Partner Violence: Not on file    Family History  Problem Relation Age of Onset   Thyroid disease Mother    Heart disease Mother    Diabetes Mother    Asthma Father    Stroke Father    Cancer - Colon Other    Memory loss Neg Hx    Breast cancer Neg Hx     Past Medical History:  Diagnosis Date   Anxiety    Bipolar disorder ([place])    Bundle branch block    Constipation    Depression    Elevated cholesterol    dx in early 44s   Endometriosis    Fatty liver    Female bladder prolapse    Fibroid    Headache(784.0)     Hyperlipidemia    Hypothyroidism    Mitral valve prolapse    during adolescence   OSA (obstructive sleep apnea)    mild (HST 05/2018 ESS 15 AHI-10/hr, 02 min-82%) dream enactment behavior- improved on APAP   Ovarian torsion    Pancreatitis    Pituitary macroadenoma ([place])    Shortness of breath     Patient Active Problem List   Diagnosis Date Noted   REM sleep behavior disorder 03/06/2022   OSA (obstructive sleep apnea) 03/06/2022   Sinus tachycardia 02/02/2019   Palpitations 10/11/2018   Hypoxia 06/15/2014   Bundle branch block    Shortness of breath    Hypothyroidism    Bipolar disorder ([place])    Fibroid    Endometriosis    Pancreatitis    Ovarian torsion    Female bladder prolapse    Chest pain, unspecified 09/05/2013   Elevated cholesterol    Mitral valve prolapse     Past  Surgical History:  Procedure Laterality Date   ABDOMINAL HYSTERECTOMY     APPENDECTOMY     CHOLECYSTECTOMY     COLONOSCOPY     DILATION AND CURETTAGE OF UTERUS     ESOPHAGOGASTRODUODENOSCOPY (EGD) WITH PROPOFOL N/A 09/09/2013   Procedure: ESOPHAGOGASTRODUODENOSCOPY (EGD) WITH PROPOFOL;  Surgeon: Arta Silence, MD;  [facility] ENDOSCOPY;  Service: Endoscopy;  Laterality: N/A;   EUS N/A 09/09/2013   Procedure: ESOPHAGEAL ENDOSCOPIC ULTRASOUND (EUS) RADIAL;  Surgeon: Arta Silence, MD;  Location: [facility] ENDOSCOPY;  Service: Endoscopy;  Laterality: N/A;   HERNIA REPAIR     LAPAROSCOPY Left 11/15/2013   Procedure: LAPAROSCOPY OPERATIVE WITH LEFT SALPINGO-OOPHORECTOMY/LYSIS OF ADHESIONS/REMOVAL OF LEFT OVARIAN CYST;  Surgeon: Betsy Coder, MD;  Location: [facility];  Service: Gynecology;  Laterality: Left;   OOPHORECTOMY     pilonidal cyst-age 53      Current Outpatient Medications  Medication Sig Dispense Refill   acetaminophen (TYLENOL) 500 MG tablet Take 1,000 mg by mouth every 6 (six) hours as needed for mild pain.     B Complex-C (B-COMPLEX WITH VITAMIN C) tablet Take 1 tablet by mouth daily.      Biotin 5000 MCG CAPS Take 1 capsule by mouth daily.     BIOTIN PO Take 1 tablet by mouth daily.     Cholecalciferol (VITAMIN D3) 125 MCG (5000 UT) CAPS Take 2 capsules by mouth daily.     Cholecalciferol (VITAMIN D3) 2000 UNITS TABS Take 2 tablets by mouth daily.      lamoTRIgine (LAMICTAL) 200 MG tablet Take 1 tablet by mouth every evening.     levothyroxine (SYNTHROID) 88 MCG tablet Take 88 mcg by mouth daily.     loratadine (CLARITIN) 10 MG tablet Take 1 tablet by mouth as needed.     MAGNESIUM PO Take 1 tablet by mouth daily.     metoprolol succinate (TOPROL XL) 25 MG 24 hr tablet Take 1 tablet (25 mg total) by mouth daily. 90 tablet 3   metroNIDAZOLE (METROCREAM) 0.75 % cream Apply 1 application topically as needed.     NIACIN PO Take 500 mg by mouth daily.      venlafaxine XR (EFFEXOR-XR) 150 MG 24 hr capsule Take 1 capsule by mouth daily.     No current facility-administered medications for this visit.    Allergies as of 03/06/2022 - Review Complete 12/19/2021  Allergen Reaction Noted   Vancomycin Hives 09/05/2013   Ibuprofen  09/27/2018   Oxycodone-acetaminophen Itching 05/20/2016   Penicillins Hives 09/05/2013   Toradol [ketorolac tromethamine] Itching and Other (See Comments) 07/19/2018    Vitals: There were no vitals taken for this visit. Last Weight:  Wt Readings from Last 1 Encounters:  11/22/21 159 lb (72.1 kg)   Last Height:   Ht Readings from Last 1 Encounters:  11/22/21 '5\' 5"'$  (1.651 m)    Physical exam: Exam: Gen: NAD, conversant      CV: Denies palpitations or chest pain or SOB. VS: Breathing at a normal rate. Not febrile. Eyes: Conjunctivae clear without exudates or hemorrhage  Neuro: Detailed Neurologic Exam  Speech:    Speech is normal; fluent and spontaneous with normal comprehension.  Cognition:    The patient is oriented to person, place, and time;     recent and remote memory intact;     language fluent;     normal attention,  concentration,     fund of knowledge Cranial Nerves:    The pupils are equal, round, and reactive  to light. Cannot perform fundoscopic exam. Visual fields are full to finger confrontation. Extraocular movements are intact.  The face is symmetric with normal sensation. The palate elevates in the midline. Hearing intact. Voice is normal. Shoulder shrug is normal. The tongue has normal motion without fasciculations.   Coordination:    Normal finger to nose  Gait:    Normal native gait  Motor Observation:   no involuntary movements noted. Tone:    Appears normal  Posture:    Posture is normal. normal erect    Strength:    Strength is anti-gravity and symmetric in the upper and lower limbs.      Sensation: intact to LT         Assessment/Plan:  56 year old with anxiety, bipolar disorder, depression, fatty liver, hld, hypothyroidism, osa(mild on cpap) for cognitive difficulties/memory loss 10 years. Recent MRI brain unremarkable.  Discussed Dr. Ferne Coe assessment below from formal neurocognitive testing. She had a great experience. She is sleeping better with melatonin. She uses a cpap, I would suggest discussing REM Sleep disorder and vivid dreams with her sleep doctor and this is know to patient and sleep doctor and can be a pre-cursor to parkinsonian disease but at this time she has no signs or symptoms would monitor clinically. MRI of the brain with t2 hyperintensities from my review, likely benign, can be microvascular changes or sequelae of migraines and not progressive since 2017, Also discussed white matter spots can be from vascular risk factors, work with pcp to keep close eye on things like cholesterol, blood pressure, glucose, sleep apnea and other vascular risk factors.  Bipolar disorder, in partial remission, most recent episode depressed ([place])  REM sleep behavior disorder  OSA (obstructive sleep apnea)  Follow up with pcp, psychiatry and sleep physician. Can retest  with dr Sima Matas if needed. F/u here as needed, for now return to pcp.   (Dr. Ferne Coe assessment from formal memory testing: As far as diagnostic variables, the pattern of symptoms with long duration of symptoms, cognitive strengths and weaknesses objectively assessed here are all consistent with likely direct impacts of her ongoing and continued significant sleep disturbance, underlying psychiatric issues related to bipolar affective disorder and some possible impact of psychiatric medication she is taking for bipolar disorder (Lamictal).  These variables are likely creating the negative impact on her encoding capacity which are producing her day-to-day symptoms.  Anxiety, depression and bipolar disorder type symptoms are likely a significant contributing factor along with ongoing significant sleep disorder.  The patient does not have any pattern related to a progressive neurological disorder such as Alzheimer's Lewy body or other degenerative conditions.  The patient has no reports of any auditory or visual hallucinations, tremors, changes in visual-spatial geographic orientation etc.  Diagnosis(Dr. Sima Matas):                                Subjective memory complaints   REM behavioral disorder   Bipolar disorder, in partial remission, most recent episode depressed [facility])   Dream enactment behavior  Cc: Jamie Kato,  Ramiro Harvest, [place]  Sarina Ill, MD  [facility] [address] [place], [place] [address]  Phone [phone] Fax [phone]

## 2022-03-06 NOTE — Patient Instructions (Addendum)
Discussed Dr. Ferne Coe assessment below from formal neurocognitive testing. She had a great experience. She is sleeping better with melatonin. She uses a cpap, I would suggest discussing REM Sleep disorder and vivid dreams with her sleep doctor and this is know to patient and sleep doctor and can be a pre-cursor to parkinsonian disease but at this time she has no signs or symptoms would monitor clinically. MRI of the brain with t2 hyperintensities from my review, likely benign, can be microvascular changes or sequelae of migraines and not progressive since 2017, Also discussed white matter spots can be from vascular risk factors, work with pcp to keep close eye on things like cholesterol, blood pressure, glucose, sleep apnea and other vascular risk factors.  Bipolar disorder, in partial remission, most recent episode depressed ([place])  REM sleep behavior disorder  OSA (obstructive sleep apnea)  Follow up with pcp, psychiatry and sleep physician. Can retest with dr Sima Matas if needed. F/u here as needed, for now return to pcp.   (Dr. Ferne Coe assessment from formal memory testing: As far as diagnostic variables, the pattern of symptoms with long duration of symptoms, cognitive strengths and weaknesses objectively assessed here are all consistent with likely direct impacts of her ongoing and continued significant sleep disturbance, underlying psychiatric issues related to bipolar affective disorder and some possible impact of psychiatric medication she is taking for bipolar disorder (Lamictal).  These variables are likely creating the negative impact on her encoding capacity which are producing her day-to-day symptoms.  Anxiety, depression and bipolar disorder type symptoms are likely a significant contributing factor along with ongoing significant sleep disorder.  The patient does not have any pattern related to a progressive neurological disorder such as Alzheimer's Lewy body or other degenerative  conditions.  The patient has no reports of any auditory or visual hallucinations, tremors, changes in visual-spatial geographic orientation etc.  Diagnosis(Dr. Sima Matas):                                Subjective memory complaints   REM behavioral disorder   Bipolar disorder, in partial remission, most recent episode depressed ([place])   Dream enactment behavior

## 2022-03-12 DIAGNOSIS — F515 Nightmare disorder: Secondary | ICD-10-CM | POA: Diagnosis not present

## 2022-03-12 DIAGNOSIS — G4733 Obstructive sleep apnea (adult) (pediatric): Secondary | ICD-10-CM | POA: Diagnosis not present

## 2022-03-31 ENCOUNTER — Ambulatory Visit: Payer: Federal, State, Local not specified - PPO | Admitting: Psychology

## 2022-04-03 ENCOUNTER — Ambulatory Visit: Payer: Federal, State, Local not specified - PPO | Admitting: Psychology

## 2022-04-05 DIAGNOSIS — G4733 Obstructive sleep apnea (adult) (pediatric): Secondary | ICD-10-CM | POA: Diagnosis not present

## 2022-04-10 DIAGNOSIS — N390 Urinary tract infection, site not specified: Secondary | ICD-10-CM | POA: Diagnosis not present

## 2022-04-17 DIAGNOSIS — L218 Other seborrheic dermatitis: Secondary | ICD-10-CM | POA: Diagnosis not present

## 2022-04-24 ENCOUNTER — Other Ambulatory Visit: Payer: Self-pay | Admitting: Family Medicine

## 2022-04-24 DIAGNOSIS — Z1231 Encounter for screening mammogram for malignant neoplasm of breast: Secondary | ICD-10-CM

## 2022-04-30 DIAGNOSIS — E663 Overweight: Secondary | ICD-10-CM | POA: Diagnosis not present

## 2022-04-30 DIAGNOSIS — L218 Other seborrheic dermatitis: Secondary | ICD-10-CM | POA: Diagnosis not present

## 2022-05-15 DIAGNOSIS — G4733 Obstructive sleep apnea (adult) (pediatric): Secondary | ICD-10-CM | POA: Diagnosis not present

## 2022-06-02 ENCOUNTER — Ambulatory Visit
Admission: RE | Admit: 2022-06-02 | Discharge: 2022-06-02 | Disposition: A | Discharge status: Home / Self Care | Payer: Federal, State, Local not specified - PPO | Source: Ambulatory Visit | Attending: Family Medicine | Admitting: Family Medicine

## 2022-06-02 DIAGNOSIS — Z1231 Encounter for screening mammogram for malignant neoplasm of breast: Secondary | ICD-10-CM | POA: Diagnosis not present

## 2022-06-05 ENCOUNTER — Other Ambulatory Visit: Payer: Self-pay | Admitting: Family Medicine

## 2022-06-05 DIAGNOSIS — R928 Other abnormal and inconclusive findings on diagnostic imaging of breast: Secondary | ICD-10-CM

## 2022-06-11 DIAGNOSIS — G4733 Obstructive sleep apnea (adult) (pediatric): Secondary | ICD-10-CM | POA: Diagnosis not present

## 2022-06-20 ENCOUNTER — Ambulatory Visit
Admission: RE | Admit: 2022-06-20 | Discharge: 2022-06-20 | Disposition: A | Discharge status: Home / Self Care | Payer: Federal, State, Local not specified - PPO | Source: Ambulatory Visit | Attending: Family Medicine | Admitting: Family Medicine

## 2022-06-20 DIAGNOSIS — R928 Other abnormal and inconclusive findings on diagnostic imaging of breast: Secondary | ICD-10-CM

## 2022-06-20 DIAGNOSIS — R921 Mammographic calcification found on diagnostic imaging of breast: Secondary | ICD-10-CM | POA: Diagnosis not present

## 2022-06-23 ENCOUNTER — Other Ambulatory Visit: Payer: Self-pay | Admitting: Family Medicine

## 2022-06-23 DIAGNOSIS — R921 Mammographic calcification found on diagnostic imaging of breast: Secondary | ICD-10-CM

## 2022-07-04 ENCOUNTER — Ambulatory Visit
Admission: RE | Admit: 2022-07-04 | Discharge: 2022-07-04 | Disposition: A | Discharge status: Home / Self Care | Payer: Federal, State, Local not specified - PPO | Source: Ambulatory Visit | Attending: Family Medicine | Admitting: Family Medicine

## 2022-07-04 DIAGNOSIS — R921 Mammographic calcification found on diagnostic imaging of breast: Secondary | ICD-10-CM

## 2022-07-04 DIAGNOSIS — N641 Fat necrosis of breast: Secondary | ICD-10-CM | POA: Diagnosis not present

## 2022-07-04 HISTORY — PX: BREAST BIOPSY: SHX20

## 2022-08-14 DIAGNOSIS — G4733 Obstructive sleep apnea (adult) (pediatric): Secondary | ICD-10-CM | POA: Diagnosis not present

## 2022-08-25 DIAGNOSIS — E782 Mixed hyperlipidemia: Secondary | ICD-10-CM | POA: Diagnosis not present

## 2022-08-25 DIAGNOSIS — F319 Bipolar disorder, unspecified: Secondary | ICD-10-CM | POA: Diagnosis not present

## 2022-08-25 DIAGNOSIS — R300 Dysuria: Secondary | ICD-10-CM | POA: Diagnosis not present

## 2022-09-02 DIAGNOSIS — R300 Dysuria: Secondary | ICD-10-CM | POA: Diagnosis not present

## 2022-10-10 DIAGNOSIS — R300 Dysuria: Secondary | ICD-10-CM | POA: Diagnosis not present

## 2022-11-27 ENCOUNTER — Other Ambulatory Visit: Payer: Self-pay | Admitting: Cardiovascular Disease

## 2022-12-23 ENCOUNTER — Other Ambulatory Visit: Payer: Self-pay | Admitting: Cardiovascular Disease

## 2022-12-30 ENCOUNTER — Other Ambulatory Visit: Payer: Self-pay | Admitting: Cardiovascular Disease

## 2023-01-12 DIAGNOSIS — E782 Mixed hyperlipidemia: Secondary | ICD-10-CM | POA: Diagnosis not present

## 2023-01-12 DIAGNOSIS — L659 Nonscarring hair loss, unspecified: Secondary | ICD-10-CM | POA: Diagnosis not present

## 2023-01-12 DIAGNOSIS — E039 Hypothyroidism, unspecified: Secondary | ICD-10-CM | POA: Diagnosis not present

## 2023-01-20 DIAGNOSIS — E039 Hypothyroidism, unspecified: Secondary | ICD-10-CM | POA: Diagnosis not present

## 2023-01-20 DIAGNOSIS — E782 Mixed hyperlipidemia: Secondary | ICD-10-CM | POA: Diagnosis not present

## 2023-01-20 DIAGNOSIS — L659 Nonscarring hair loss, unspecified: Secondary | ICD-10-CM | POA: Diagnosis not present

## 2023-01-29 ENCOUNTER — Other Ambulatory Visit: Payer: Self-pay | Admitting: Cardiovascular Disease

## 2023-02-07 ENCOUNTER — Other Ambulatory Visit: Payer: Self-pay | Admitting: Cardiovascular Disease

## 2023-02-26 DIAGNOSIS — E039 Hypothyroidism, unspecified: Secondary | ICD-10-CM | POA: Diagnosis not present

## 2023-02-26 DIAGNOSIS — E611 Iron deficiency: Secondary | ICD-10-CM | POA: Diagnosis not present

## 2023-02-26 DIAGNOSIS — E785 Hyperlipidemia, unspecified: Secondary | ICD-10-CM | POA: Diagnosis not present

## 2023-02-26 DIAGNOSIS — Z23 Encounter for immunization: Secondary | ICD-10-CM | POA: Diagnosis not present

## 2023-02-26 DIAGNOSIS — Z0000 Encounter for general adult medical examination without abnormal findings: Secondary | ICD-10-CM | POA: Diagnosis not present

## 2023-02-26 DIAGNOSIS — F419 Anxiety disorder, unspecified: Secondary | ICD-10-CM | POA: Diagnosis not present

## 2023-02-26 DIAGNOSIS — F319 Bipolar disorder, unspecified: Secondary | ICD-10-CM | POA: Diagnosis not present

## 2023-03-02 ENCOUNTER — Encounter: Payer: Self-pay | Admitting: Cardiovascular Disease

## 2023-03-02 NOTE — Progress Notes (Unsigned)
Cardiology Office Note:    Date:  03/03/2023   ID:  Alyssa Holmes, DOB 11-29-1965, MRN [id number]  PCP:  Sheliah Hatch PA-C  Cardiologist:  Normal Recinos  Electrophysiologist:  None   Referring MD: Sheliah Hatch, PA-C   Chief Complaint  Patient presents with   Palpitations    Aug. 19, 2020     Alyssa Holmes is a 57 y.o. female with a hx of MVP and palpitations She was seen via telemedicine in April, 2020 She has a hx of MVP.   Started having palpitations several months ago.  Not related to exercise Clinically the palpitations sound c/w PVCs. Advised increasing her intake of potassium ( V8)  Advised regular exercise  Still has occasional palps  Is not exercising ,    Has occasional hot flashes Is on synthroid - TSH 3 months ago was normal .   Alyssa Holmes is seen today for follow up of her MVP and episodes o fpalpitations    May 10, 2019:  Has mvp with palpitations  we gave her metoprolol 25 mg bid  She is taking only once a day  - she cut the dose due to fatigue and constipation .  She has started exercising   Jan. 31, 2022:  Alyssa Holmes is seen today for follow  Up of her MVP and palpitations She was last seen in Nov. 2020 No CP , no palpitations Palpitations are better on the toprol 25 mg a day  Trying to exercise   June, 9, 2023 Alyssa Holmes is seen today for follow up of her MVP and palpitations Has been having some memory issues  No CP , no dyspne  Some exercise    Sept. 17, 2024 Alyssa Holmes is seen for follow up of her MVP and palpitations We reduced the dose of her Toprol last year to see if her memory issues would improve  As a trial, We will see if she tolerates bisoprolol instead of Toprol XL   She was started on a statin recently - she thinks atorva 10 mg a day   [facility]   Total cholesterol 292 HDL is 49 LDL is 138 Triglyceride level is 333.      Past Medical History:  Diagnosis Date   Anxiety    Bipolar  disorder (HCC)    Bundle branch block    Constipation    Depression    Elevated cholesterol    dx in early 30s   Endometriosis    Fatty liver    Female bladder prolapse    Fibroid    Headache(784.0)    Hyperlipidemia    Hypothyroidism    Mitral valve prolapse    during adolescence   OSA (obstructive sleep apnea)    mild (HST 05/2018 ESS 15 AHI-10/hr, 02 min-82%) dream enactment behavior- improved on APAP   Ovarian torsion    Pancreatitis    Pituitary macroadenoma (HCC)    Shortness of breath     Past Surgical History:  Procedure Laterality Date   ABDOMINAL HYSTERECTOMY     APPENDECTOMY     BREAST BIOPSY Left 07/04/2022   MM LT BREAST BX W LOC DEV 1ST LESION IMAGE BX SPEC STEREO GUIDE 07/04/2022 [facility]   CHOLECYSTECTOMY     COLONOSCOPY     DILATION AND CURETTAGE OF UTERUS     ESOPHAGOGASTRODUODENOSCOPY (EGD) WITH PROPOFOL N/A 09/09/2013   Procedure: ESOPHAGOGASTRODUODENOSCOPY (EGD) WITH PROPOFOL;  Surgeon: Willis Modena, MD;  Location: [facility] ENDOSCOPY;  Service: Endoscopy;  Laterality: N/A;   EUS N/A 09/09/2013   Procedure: ESOPHAGEAL ENDOSCOPIC ULTRASOUND (EUS) RADIAL;  Surgeon: Willis Modena, MD;  Location: [facility] ENDOSCOPY;  Service: Endoscopy;  Laterality: N/A;   HERNIA REPAIR     LAPAROSCOPY Left 11/15/2013   Procedure: LAPAROSCOPY OPERATIVE WITH LEFT SALPINGO-OOPHORECTOMY/LYSIS OF ADHESIONS/REMOVAL OF LEFT OVARIAN CYST;  Surgeon: Michael Litter, MD;  Location: [facility] ORS;  Service: Gynecology;  Laterality: Left;   OOPHORECTOMY     pilonidal cyst-age 4      Current Medications: Current Meds  Medication Sig   acetaminophen (TYLENOL) 500 MG tablet Take 1,000 mg by mouth every 6 (six) hours as needed for mild pain.   B Complex-C (B-COMPLEX WITH VITAMIN C) tablet Take 1 tablet by mouth daily.   Biotin 5000 MCG CAPS Take 1 capsule by mouth daily.   BIOTIN PO Take 1 tablet by mouth daily.   bisoprolol (ZEBETA) 5 MG tablet Take 0.5 tablets (2.5 mg total) by mouth  daily.   Cholecalciferol (VITAMIN D3) 125 MCG (5000 UT) CAPS Take 2 capsules by mouth daily.   Cholecalciferol (VITAMIN D3) 2000 UNITS TABS Take 2 tablets by mouth daily.    ezetimibe (ZETIA) 10 MG tablet Take 10 mg by mouth daily.   lamoTRIgine (LAMICTAL) 200 MG tablet Take 1 tablet by mouth every evening.   levothyroxine (SYNTHROID) 88 MCG tablet Take 88 mcg by mouth daily.   loratadine (CLARITIN) 10 MG tablet Take 1 tablet by mouth as needed.   MAGNESIUM PO Take 1 tablet by mouth daily.   metroNIDAZOLE (METROCREAM) 0.75 % cream Apply 1 application topically as needed.   NIACIN PO Take 500 mg by mouth daily.    venlafaxine XR (EFFEXOR-XR) 150 MG 24 hr capsule Take 1 capsule by mouth daily.   [DISCONTINUED] atorvastatin (LIPITOR) 10 MG tablet Take 10 mg by mouth daily.   [DISCONTINUED] metoprolol succinate (TOPROL-XL) 25 MG 24 hr tablet Take 1 tablet (25 mg total) by mouth daily. Please call office to schedule an appt for further refills. Thank you     Allergies:   Vancomycin, Ibuprofen, Oxycodone-acetaminophen, Penicillins, and Toradol [ketorolac tromethamine]   Social History   Socioeconomic History   Marital status: Married    Spouse name: Not on file   Number of children: Not on file   Years of education: Not on file   Highest education level: Not on file  Occupational History   Not on file  Tobacco Use   Smoking status: Former    Types: Cigarettes   Smokeless tobacco: Never  Vaping Use   Vaping status: Never Used  Substance and Sexual Activity   Alcohol use: Yes    Alcohol/week: 2.0 standard drinks of alcohol    Types: 2 Glasses of wine per week    Comment: Just on Weekends   Drug use: No   Sexual activity: Yes    Birth control/protection: Post-menopausal  Other Topics Concern   Not on file  Social History Narrative   Not on file   Social Determinants of Health   Financial Resource Strain: Not on file  Food Insecurity: Not on file  Transportation Needs: Not on  file  Physical Activity: Not on file  Stress: Not on file  Social Connections: Not on file     Family History: The patient's family history includes Asthma in her father; Cancer - Colon in an other family member; Diabetes in her mother; Heart disease in her mother; Stroke in her father; Thyroid disease in her mother. There  is no history of Memory loss or Breast cancer.  ROS:   Please see the history of present illness.     All other systems reviewed and are negative.  EKGs/Labs/Other Studies Reviewed:    The following studies were reviewed today:   EKG:   EKG Interpretation Date/Time:  Tuesday March 03 2023 15:33:58 EDT Ventricular Rate:  70 PR Interval:  194 QRS Duration:  144 QT Interval:  458 QTC Calculation: 494 R Axis:   121  Text Interpretation: Normal sinus rhythm Right bundle branch block Septal infarct (cited on or before 02-Mar-2020) When compared with ECG of 11-Dec-2020 13:46, Questionable change in initial forces of Septal leads Confirmed by Kristeen Miss ([id number]) on 03/03/2023 3:59:18 PM   .  Recent Labs: No results found for requested labs within last 365 days.  Recent Lipid Panel    Component Value Date/Time   CHOL 218 (H) 09/06/2013 1318   TRIG 403 (H) 09/09/2013 1312   HDL 30 (L) 09/06/2013 1318   CHOLHDL 7.3 09/06/2013 1318   VLDL UNABLE TO CALCULATE IF TRIGLYCERIDE OVER 400 mg/dL 16/03/9603 5409   LDLCALC UNABLE TO CALCULATE IF TRIGLYCERIDE OVER 400 mg/dL 81/19/1478 2956    Physical Exam:     Physical Exam: Blood pressure 106/70, pulse 73, height 5\' 5"  (1.651 m), weight 158 lb 6.4 oz (71.8 kg), SpO2 95%.       GEN:  Well nourished, well developed in no acute distress HEENT: Normal NECK: No JVD; No carotid bruits LYMPHATICS: No lymphadenopathy CARDIAC: RRR , no murmurs, rubs, gallops RESPIRATORY:  Clear to auscultation without rales, wheezing or rhonchi  ABDOMEN: Soft, non-tender, non-distended MUSCULOSKELETAL:  No edema; No deformity   SKIN: Warm and dry NEUROLOGIC:  Alert and oriented x 3    ASSESSMENT:    1. Palpitations   2. Elevated cholesterol   3. Hyperlipidemia, unspecified hyperlipidemia type     PLAN:      Tachycardia:   She thinks that her tachypalpitations are better but she thinks she still having a little bit of fogginess.  She decreased her Toprol-XL slightly and noticed improvement of her mental fogginess but we will see if she feels better on bisoprolol 2.5 mg a day instead of Toprol.   2.  Hyperlipidemia: Her triglyceride level is 333.  She has a family history of coronary artery disease and stroke.  She has been started on Zetia 10 mg a day.  Will check a coronary calcium score and will follow-up after that.    Will see her in 4 months      Medication Adjustments/Labs and Tests Ordered: Current medicines are reviewed at length with the patient today.  Concerns regarding medicines are outlined above.  Orders Placed This Encounter  Procedures   CT CARDIAC SCORING (SELF PAY ONLY)   EKG 12-Lead   Meds ordered this encounter  Medications   bisoprolol (ZEBETA) 5 MG tablet    Sig: Take 0.5 tablets (2.5 mg total) by mouth daily.    Dispense:  45 tablet    Refill:  3     Patient Instructions  Medication Instructions:   STOP TAKING METOPROLOL SUCCINATE (TOPROL XL) NOW  START TAKING BISOPROLOL 2.5 MG BY MOUTH DAILY  *If you need a refill on your cardiac medications before your next appointment, please call your pharmacy*    Testing/Procedures:  CARDIAC CALCIUM SCORE (SELF PAY) AT OUR [place] LOC[facility]    Follow-Up:  3 MONTHS WITH DR. Elease Hashimoto    Podcast  Pricilla Holm  Barnie Del and [employer]   - The truth  about Ozempic, the pill and how [employer] keeps you sick   Book - Good energy   Also - good energy cookbook         Signed, Kristeen Miss, MD  03/03/2023 5:18 PM    [facility]

## 2023-03-03 ENCOUNTER — Ambulatory Visit: Payer: Federal, State, Local not specified - PPO | Attending: Cardiovascular Disease | Admitting: Cardiovascular Disease

## 2023-03-03 ENCOUNTER — Encounter: Payer: Self-pay | Admitting: Cardiovascular Disease

## 2023-03-03 VITALS — BP 106/70 | HR 73 | Ht 65.0 in | Wt 158.4 lb

## 2023-03-03 DIAGNOSIS — E7800 Pure hypercholesterolemia, unspecified: Secondary | ICD-10-CM | POA: Diagnosis not present

## 2023-03-03 DIAGNOSIS — R002 Palpitations: Secondary | ICD-10-CM | POA: Diagnosis not present

## 2023-03-03 DIAGNOSIS — E785 Hyperlipidemia, unspecified: Secondary | ICD-10-CM

## 2023-03-03 MED ORDER — BISOPROLOL FUMARATE 5 MG PO TABS: 2.5000 mg | ORAL_TABLET | Freq: Every day | ORAL | 3 refills | Status: DC

## 2023-03-03 NOTE — Patient Instructions (Addendum)
Medication Instructions:   STOP TAKING METOPROLOL SUCCINATE (TOPROL XL) NOW  START TAKING BISOPROLOL 2.5 MG BY MOUTH DAILY  *If you need a refill on your cardiac medications before your next appointment, please call your pharmacy*    Testing/Procedures:  CARDIAC CALCIUM SCORE (SELF PAY) AT OUR [place] LOC[facility]    Follow-Up:  3 MONTHS WITH DR. Elease Hashimoto    Podcast  - Lolita Patella - [place] [employer]   - The truth  about Ozempic, the pill and how [employer] keeps you sick   Book - Good energy   Also - good energy cookbook

## 2023-03-18 ENCOUNTER — Ambulatory Visit
Admit: 2023-03-18 | Discharge: 2023-03-18 | Payer: BLUE CROSS/BLUE SHIELD | Attending: Physician Assistant | Primary: Family Medicine

## 2023-03-18 VITALS — BP 150/92 | HR 82 | Temp 98.40000°F | Wt 212.0 lb

## 2023-03-18 DIAGNOSIS — J209 Acute bronchitis, unspecified: Secondary | ICD-10-CM

## 2023-03-18 MED ORDER — BENZONATATE 100 MG PO CAPS
100 | ORAL_CAPSULE | Freq: Three times a day (TID) | ORAL | 0 refills | Status: AC | PRN
Start: 2023-03-18 — End: 2023-03-25

## 2023-03-18 MED ORDER — PREDNISONE 10 MG PO TABS
10 | ORAL_TABLET | ORAL | 0 refills | Status: DC
Start: 2023-03-18 — End: 2023-08-05

## 2023-03-18 MED ORDER — AMOXICILLIN-POT CLAVULANATE 875-125 MG PO TABS
875-125 | ORAL_TABLET | Freq: Two times a day (BID) | ORAL | 0 refills | Status: AC
Start: 2023-03-18 — End: 2023-03-28

## 2023-03-18 NOTE — Progress Notes (Signed)
 03/18/23  Lauren Hull DOB: 1966-02-19 Sex: female  Age 57 y.o.      Subjective:  Chief Complaint   Patient presents with    Cough     Home covid test negative    Congestion         HPI:   HPI  Lauren Hull , 57 y.o. female presents to express care for evaluation of cough, congestion, drainage.    The patient has had the symptoms ongoing since Sunday increase sinus congestion, drainage, cough.  The patient did have some leftover Augmentin  that she started taking.  The patient does have a history of tobacco abuse.  Does have a history of bronchitis and pneumonia.  The patient is not in any apparent distress.  The patient has not had any fevers.    Home COVID test negative    The patient does have some myalgias but this seems to be more of an ongoing issue.  The patient is not currently on any steroids.        ROS:   Unless otherwise stated in this report the patient's positive and negative responses for review of systems for constitutional, eyes, ENT, cardiovascular, respiratory, gastrointestinal, neurological, GU, musculoskeletal, and integument systems and related systems to the presenting problem are either stated in the history of present illness or were not pertinent or were negative for the symptoms and/or complaints related to the presenting medical problem.  Positives and pertinent negatives as per HPI.  All others reviewed and are negative.      PMH:     Past Medical History:   Diagnosis Date    Anxiety     Arthritis     Bronchitis     Leg pain     PONV (postoperative nausea and vomiting)     with anesthesia       Past Surgical History:   Procedure Laterality Date    BACK SURGERY  2019    ECTOPIC PREGNANCY SURGERY  1986    JOINT REPLACEMENT Left 06/2019    hip    NERVE BLOCK Left 07/20/2015    transforaminal nerve, lumbar #1    NERVE BLOCK Left 08/17/2015    NERVE BLOCK Left 09/06/2015    lumbar tfnb #3    NERVE BLOCK Left 11/01/2015    medial branch block, lumbar #1    NERVE BLOCK Right  11/15/2015    MBB #2    NERVE BLOCK Left 02/06/2016    lumbar radiofrequency     TUBAL LIGATION      UPPER GASTROINTESTINAL ENDOSCOPY N/A 03/16/2020    EGD BIOPSY performed by Fairy DELENA Lunger, MD at [facility] ENDOSCOPY    VEIN SURGERY      VEIN SURGERY Left 02/15/2021    STAB PHLEBECTOMIES LEFT LOWER EXTREMITY performed by Ozell Broom, MD at [facility] OR    VEIN SURGERY Right 03/21/2021    RIGHT LOWER EXTREMITY STAB PHLEBECTOMY performed by Ozell Broom, MD at [facility] OR       History reviewed. No pertinent family history.    Medications:     Current Outpatient Medications:     amoxicillin -clavulanate (AUGMENTIN ) 875-125 MG per tablet, Take 1 tablet by mouth 2 times daily for 10 days, Disp: 20 tablet, Rfl: 0    predniSONE  (DELTASONE ) 10 MG tablet, 3 tabs once daily for 3 days, 2 tabs once daily for 3 days, 1 tab once daily for 3 days, Disp: 18 tablet, Rfl: 0    benzonatate  (TESSALON ) 100 MG  capsule, Take 1 capsule by mouth 3 times daily as needed for Cough, Disp: 21 capsule, Rfl: 0    Multiple Vitamins-Minerals (THERAPEUTIC MULTIVITAMIN-MINERALS) tablet, Take 1 tablet by mouth daily, Disp: , Rfl:     Elastic Bandages & Supports (JOBST KNEE HIGH COMPRESSION SM) MISC, Knee high compression stockings, 20-30 mm/Hg, Disp: 2 each, Rfl: 3    Omeprazole (PRILOSEC PO), Take by mouth daily, Disp: , Rfl:     acetaminophen  (TYLENOL ) 325 MG tablet, Take 2 tablets by mouth every 6 hours as needed for Pain, Disp: , Rfl:     sertraline (ZOLOFT) 50 MG tablet, take 1 tablet by mouth once daily, Disp: , Rfl: 0    Allergies:     Allergies   Allergen Reactions    Ibuprofen Shortness Of Breath and Itching       Social History:     Social History     Tobacco Use    Smoking status: Every Day     Current packs/day: 0.50     Average packs/day: 0.5 packs/day for 30.0 years (15.0 ttl pk-yrs)     Types: Cigarettes    Smokeless tobacco: Never   Vaping Use    Vaping status: Never Used   Substance Use Topics    Alcohol use: No    Drug use: No        Patient lives at home.    Physical Exam:     Vitals:    03/18/23 1541   BP: (!) 150/92   Pulse: 82   Temp: 98.4 F (36.9 C)   TempSrc: Temporal   SpO2: 95%   Weight: 96.2 kg (212 lb)       Exam:  Physical Exam  Nurse's notes and vital signs reviewed. The patient is not hypoxic.  ?  General: Alert, no acute distress, patient resting comfortably Patient is not toxic or lethargic.  Skin: Warm, intact, no pallor noted. There is no evidence of rash at this time.  Head: Normocephalic, atraumatic  Eye: Normal conjunctiva  Ears, Nose, Throat: Right tympanic membrane clear, left tympanic membrane clear. No drainage or discharge noted. No pre- or post-auricular tenderness, erythema, or swelling noted.   Denies congestion, rhinorrhea, clear drainage  Posterior oropharynx shows erythema but no evidence of tonsillar hypertrophy, or exudate. the uvula is midline. No trismus or drooling is noted.   Moist mucous membranes.  Neck: No anterior/posterior lymphadenopathy noted. No erythema, no masses, no fluctuance or induration noted. No meningeal signs.  Cardiovascular: Regular Rate and Rhythm  Respiratory: No acute distress, mild expiratory wheeze, no evidence of rhonchi or crackles. No stridor or retractions are noted.  Neurological: A&O x4, normal speech  Psychiatric: Cooperative       Testing:     No results found for this visit on 03/18/23.        Medical Decision Making:     Vital signs reviewed    Past medical history reviewed.    Allergies reviewed.    Medications reviewed.    Patient on arrival does not appear to be in any apparent distress or discomfort.  The patient has been seen and evaluated.  The patient does not appear to be toxic or lethargic.     Patient elected to hold off on the x-ray at this time which I do feel reasonable.  The patient will be treated with Augmentin , prednisone , Tessalon  Perles.  She will monitor symptoms closely.    The patient was educated on the proper dosage of motrin  and tylenol  and  the appropriate intervals of each. The patient is to increase fluid intake over the next several days. The patient is to use OTC decongestant as needed.     The patient is to return to express care or go directly to the emergency department should any of the signs or symptoms worsen. The patient is to followup with primary care physician in 2-3 days for repeat evaluation. The patient has no other questions or concerns at this time the patient will be discharged home.      Clinical Impression:   Emmelyn Schmale was seen today for cough and congestion.    Diagnoses and all orders for this visit:    Acute bronchitis, unspecified organism    Acute non-recurrent sinusitis, unspecified location    Other orders  -     amoxicillin -clavulanate (AUGMENTIN ) 875-125 MG per tablet; Take 1 tablet by mouth 2 times daily for 10 days  -     predniSONE  (DELTASONE ) 10 MG tablet; 3 tabs once daily for 3 days, 2 tabs once daily for 3 days, 1 tab once daily for 3 days  -     benzonatate  (TESSALON ) 100 MG capsule; Take 1 capsule by mouth 3 times daily as needed for Cough        The patient is to call for any concerns or return if any of the signs or symptoms worsen. The patient is to follow-up with PCP in the next 2-3 days for repeat evaluation repeat assessment or go directly to the emergency department.     SIGNATURE: Sim VEAR Moats III, PA-C    This document may have been prepared at least partially through the use of voice recognition software. Although effort is taken to assure the accuracy ofthis document, it is possible that grammatical, syntax, or spelling errors may occur.

## 2023-03-30 ENCOUNTER — Ambulatory Visit (HOSPITAL_BASED_OUTPATIENT_CLINIC_OR_DEPARTMENT_OTHER)
Admission: RE | Admit: 2023-03-30 | Discharge: 2023-03-30 | Disposition: A | Discharge status: Home / Self Care | Payer: Federal, State, Local not specified - PPO | Source: Ambulatory Visit | Attending: Cardiovascular Disease | Admitting: Cardiovascular Disease

## 2023-03-30 DIAGNOSIS — R002 Palpitations: Secondary | ICD-10-CM | POA: Insufficient documentation

## 2023-03-30 DIAGNOSIS — E7800 Pure hypercholesterolemia, unspecified: Secondary | ICD-10-CM | POA: Insufficient documentation

## 2023-03-30 DIAGNOSIS — E785 Hyperlipidemia, unspecified: Secondary | ICD-10-CM | POA: Insufficient documentation

## 2023-04-01 DIAGNOSIS — G4733 Obstructive sleep apnea (adult) (pediatric): Secondary | ICD-10-CM | POA: Diagnosis not present

## 2023-04-01 DIAGNOSIS — F515 Nightmare disorder: Secondary | ICD-10-CM | POA: Diagnosis not present

## 2023-04-06 DIAGNOSIS — G4733 Obstructive sleep apnea (adult) (pediatric): Secondary | ICD-10-CM | POA: Diagnosis not present

## 2023-04-11 ENCOUNTER — Other Ambulatory Visit: Payer: Self-pay | Admitting: Medical Genetics

## 2023-04-11 DIAGNOSIS — Z006 Encounter for examination for normal comparison and control in clinical research program: Secondary | ICD-10-CM

## 2023-04-17 ENCOUNTER — Ambulatory Visit
Admit: 2023-04-17 | Discharge: 2023-04-17 | Payer: BLUE CROSS/BLUE SHIELD | Attending: Family Medicine | Primary: Family Medicine

## 2023-04-17 ENCOUNTER — Ambulatory Visit: Admit: 2023-04-17 | Payer: BLUE CROSS/BLUE SHIELD | Primary: Family Medicine

## 2023-04-17 DIAGNOSIS — R051 Acute cough: Secondary | ICD-10-CM

## 2023-04-17 MED ORDER — METHYLPREDNISOLONE 4 MG PO TBPK
4 MG | PACK | ORAL | 0 refills | Status: DC
Start: 2023-04-17 — End: 2023-08-05

## 2023-04-17 MED ORDER — AZITHROMYCIN 250 MG PO TABS
250 | ORAL_TABLET | ORAL | 0 refills | Status: AC
Start: 2023-04-17 — End: 2023-04-27

## 2023-04-17 MED ORDER — ALBUTEROL SULFATE HFA 108 (90 BASE) MCG/ACT IN AERS
10890 (90 Base) MCG/ACT | Freq: Four times a day (QID) | RESPIRATORY_TRACT | 1 refills | Status: DC | PRN
Start: 2023-04-17 — End: 2023-08-05

## 2023-04-17 MED ORDER — CEFDINIR 300 MG PO CAPS
300 | ORAL_CAPSULE | Freq: Two times a day (BID) | ORAL | 0 refills | Status: AC
Start: 2023-04-17 — End: 2023-04-24

## 2023-04-17 NOTE — Progress Notes (Signed)
Lauren Hull (DOB:  1965/07/06) is a 57 y.o. female,Established patient, here for evaluation of the following chief complaint(s):  Cough (Patient declined testing today. Patient states she has been sick for about a month and has concern about pneumonia. ) and Congestion         Assessment & Plan  Acute bacterial sinusitis            Acute cough       Orders:    XR CHEST (2 VW); Future    azithromycin (ZITHROMAX) 250 MG tablet; 500mg  on day 1 followed by 250mg  on days 2 - 5    methylPREDNISolone (MEDROL DOSEPACK) 4 MG tablet; Take by mouth.    cefdinir (OMNICEF) 300 MG capsule; Take 1 capsule by mouth 2 times daily for 7 days    albuterol sulfate HFA (VENTOLIN HFA) 108 (90 Base) MCG/ACT inhaler; Inhale 2 puffs into the lungs 4 times daily as needed for Wheezing    Initial review of x-ray showed no acute issues.  No new changes when compared to previous from 2022.  Will treat symptomatically and she will follow-up with her PCP for further evaluation.  No follow-ups on file.       Subjective   Cough  Associated symptoms include postnasal drip, a sore throat and wheezing. Pertinent negatives include no fever, headaches or rash.     Patient presents today for concern regarding possible pneumonia.  Has had worsening congestion and cough for the last 4 weeks.  Did present to urgent care and.  Was given medication states that the symptoms did resolve however over the last 1 to 2 weeks symptoms returned.  No fever or chills.  No nausea vomiting or diarrhea.  No known sick contact or recent travel.    Review of Systems   Constitutional:  Negative for fever.   HENT:  Positive for congestion, postnasal drip and sore throat. Negative for trouble swallowing.    Eyes: Negative.    Respiratory:  Positive for cough and wheezing.    Cardiovascular: Negative.    Gastrointestinal: Negative.    Musculoskeletal:  Negative for neck pain.   Skin:  Negative for rash.   Neurological:  Negative for headaches.   Hematological:   Negative for adenopathy.   All other systems reviewed and are negative.         Current Outpatient Medications:     ciprofloxacin-dexAMETHasone (CIPRODEX) 0.3-0.1 % otic suspension, instill 4 drops INTO AFFECTED EAR(S) twice a day for 7 days, Disp: , Rfl:     azithromycin (ZITHROMAX) 250 MG tablet, 500mg  on day 1 followed by 250mg  on days 2 - 5, Disp: 6 tablet, Rfl: 0    methylPREDNISolone (MEDROL DOSEPACK) 4 MG tablet, Take by mouth., Disp: 1 kit, Rfl: 0    cefdinir (OMNICEF) 300 MG capsule, Take 1 capsule by mouth 2 times daily for 7 days, Disp: 14 capsule, Rfl: 0    predniSONE (DELTASONE) 10 MG tablet, 3 tabs once daily for 3 days, 2 tabs once daily for 3 days, 1 tab once daily for 3 days, Disp: 18 tablet, Rfl: 0    Multiple Vitamins-Minerals (THERAPEUTIC MULTIVITAMIN-MINERALS) tablet, Take 1 tablet by mouth daily, Disp: , Rfl:     Elastic Bandages & Supports (JOBST KNEE HIGH COMPRESSION SM) MISC, Knee high compression stockings, 20-30 mm/Hg, Disp: 2 each, Rfl: 3    Omeprazole (PRILOSEC PO), Take by mouth daily, Disp: , Rfl:     acetaminophen (TYLENOL) 325 MG tablet, Take 2  tablets by mouth every 6 hours as needed for Pain, Disp: , Rfl:     sertraline (ZOLOFT) 50 MG tablet, take 1 tablet by mouth once daily, Disp: , Rfl: 0   Patient Active Problem List   Diagnosis    DDD (degenerative disc disease), lumbar    Lumbar spinal stenosis    Facet arthropathy, lumbar     Chronic bilateral low back pain with left-sided sciatica    Lumbar radiculopathy    Chronic pain    Disc displacement, lumbar    Neural foraminal stenosis of lumbar spine    Osteoarthritis    RUQ pain    Varicose veins of right lower extremity with pain    Varicose veins of leg with edema, right     Past Medical History:   Diagnosis Date    Anxiety     Arthritis     Bronchitis     Leg pain     PONV (postoperative nausea and vomiting)     with anesthesia     Past Surgical History:   Procedure Laterality Date    BACK SURGERY  2019    ECTOPIC PREGNANCY  SURGERY  1986    JOINT REPLACEMENT Left 06/2019    hip    NERVE BLOCK Left 07/20/2015    transforaminal nerve, lumbar #1    NERVE BLOCK Left 08/17/2015    NERVE BLOCK Left 09/06/2015    lumbar tfnb #3    NERVE BLOCK Left 11/01/2015    medial branch block, lumbar #1    NERVE BLOCK Right 11/15/2015    MBB #2    NERVE BLOCK Left 02/06/2016    lumbar radiofrequency     TUBAL LIGATION      UPPER GASTROINTESTINAL ENDOSCOPY N/A 03/16/2020    EGD BIOPSY performed by Loreli Slot, MD at [facility] ENDOSCOPY    VEIN SURGERY      VEIN SURGERY Left 02/15/2021    STAB PHLEBECTOMIES LEFT LOWER EXTREMITY performed by Jinny Blossom, MD at [facility] OR    VEIN SURGERY Right 03/21/2021    RIGHT LOWER EXTREMITY STAB PHLEBECTOMY performed by Jinny Blossom, MD at [facility] OR     Social History     Socioeconomic History    Marital status: Single     Spouse name: Not on file    Number of children: Not on file    Years of education: Not on file    Highest education level: Not on file   Occupational History    Not on file   Tobacco Use    Smoking status: Every Day     Current packs/day: 0.50     Average packs/day: 0.5 packs/day for 30.0 years (15.0 ttl pk-yrs)     Types: Cigarettes    Smokeless tobacco: Never   Vaping Use    Vaping status: Never Used   Substance and Sexual Activity    Alcohol use: No    Drug use: No    Sexual activity: Not on file   Other Topics Concern    Not on file   Social History Narrative    Not on file     Social Determinants of Health     Financial Resource Strain: Not on file   Food Insecurity: Not on file   Transportation Needs: Not on file   Physical Activity: Not on file   Stress: Not on file   Social Connections: Not on file   Intimate Partner Violence: Not on file   Housing  Stability: Not on file     No family history on file.   There are no preventive care reminders to display for this patient.  There are no preventive care reminders to display for this patient.   Diabetes Management   Topic Date Due    Lipids  Never  done      Health Maintenance Due   Topic    DTaP/Tdap/Td vaccine (1 - Tdap)    Shingles vaccine (1 of 2)      Health Maintenance   Topic Date Due    Depression Screen  Never done    HIV screen  Never done    Hepatitis C screen  Never done    Hepatitis B vaccine (1 of 3 - 19+ 3-dose series) Never done    DTaP/Tdap/Td vaccine (1 - Tdap) Never done    Cervical cancer screen  Never done    Lipids  Never done    Breast cancer screen  Never done    Colorectal Cancer Screen  Never done    Shingles vaccine (1 of 2) Never done    Pneumococcal 0-64 years Vaccine (2 of 2 - PCV) 05/26/2021    Flu vaccine (1) Never done    COVID-19 Vaccine (3 - 2023-24 season) 02/15/2023    Hepatitis A vaccine  Aged Out    Hib vaccine  Aged Out    Polio vaccine  Aged Out    Meningococcal (ACWY) vaccine  Aged Out      There are no preventive care reminders to display for this patient.   There are no preventive care reminders to display for this patient.     BP 110/76   Pulse 77   Temp 96.8 F (36 C)   Ht 1.702 m (5\' 7" )   Wt 97.9 kg (215 lb 12.8 oz)   SpO2 96%   BMI 33.80 kg/m     Objective   Physical Exam  Vitals reviewed.   Constitutional:       Appearance: She is well-developed. She is ill-appearing.   HENT:      Head: Normocephalic and atraumatic.      Right Ear: External ear normal.      Left Ear: External ear normal.      Nose: Nose normal.   Eyes:      Conjunctiva/sclera: Conjunctivae normal.      Pupils: Pupils are equal, round, and reactive to light.   Cardiovascular:      Rate and Rhythm: Normal rate and regular rhythm.      Heart sounds: Normal heart sounds.   Pulmonary:      Effort: Pulmonary effort is normal.      Breath sounds: Decreased breath sounds and wheezing present.   Abdominal:      General: Bowel sounds are normal.      Palpations: Abdomen is soft.   Musculoskeletal:         General: Normal range of motion.      Cervical back: Normal range of motion and neck supple.   Skin:     General: Skin is warm and dry.       Findings: No erythema.   Neurological:      Mental Status: She is alert and oriented to person, place, and time.      Cranial Nerves: No cranial nerve deficit.   Psychiatric:         Behavior: Behavior normal.         Judgment: Judgment normal.  An electronic signature was used to authenticate this note.    --Joeseph Amor Braleigh Massoud, DO

## 2023-04-29 ENCOUNTER — Other Ambulatory Visit (HOSPITAL_COMMUNITY)
Admission: RE | Admit: 2023-04-29 | Discharge: 2023-04-29 | Disposition: A | Discharge status: Home / Self Care | Payer: Self-pay | Source: Ambulatory Visit | Attending: Oncology | Admitting: Oncology

## 2023-04-29 DIAGNOSIS — Z006 Encounter for examination for normal comparison and control in clinical research program: Secondary | ICD-10-CM | POA: Insufficient documentation

## 2023-05-11 ENCOUNTER — Other Ambulatory Visit: Payer: Self-pay | Admitting: Family Medicine

## 2023-05-11 DIAGNOSIS — Z1231 Encounter for screening mammogram for malignant neoplasm of breast: Secondary | ICD-10-CM

## 2023-05-12 LAB — GENECONNECT MOLECULAR SCREEN: Genetic Analysis Overall Interpretation: NEGATIVE

## 2023-05-26 DIAGNOSIS — G4733 Obstructive sleep apnea (adult) (pediatric): Secondary | ICD-10-CM | POA: Diagnosis not present

## 2023-06-02 ENCOUNTER — Telehealth: Payer: Self-pay | Admitting: Cardiovascular Disease

## 2023-06-02 NOTE — Telephone Encounter (Signed)
Pt c/o medication issue:  1. Name of Medication: Bisoprolol  2. How are you currently taking this medication (dosage and times per day)?  1 time a day  3. Are you having a reaction (difficulty breathing--STAT)?   4. What is your medication issue? Patient says she would like to start back taking Metoprolol- she said this other medicine is not working for her

## 2023-06-02 NOTE — Telephone Encounter (Signed)
Spoke with patient and she stated she would like to switch back to metoprolol. She does not feel bisoprolol is really helping. She feels metoprolol worked better for her.  Will forward to provider

## 2023-06-04 MED ORDER — METOPROLOL SUCCINATE ER 25 MG PO TB24: 25.0000 mg | ORAL_TABLET | Freq: Every day | ORAL | 3 refills | Status: DC

## 2023-06-04 NOTE — Telephone Encounter (Signed)
Called patient.  Informed okay with Dr. Elease Hashimoto to switch back to Toprol XL 25 mg daily.  Sent prescription to [facility].  Discontinued bisoprolol.  Pt grateful for assistance.

## 2023-06-12 ENCOUNTER — Ambulatory Visit
Admission: RE | Admit: 2023-06-12 | Discharge: 2023-06-12 | Disposition: A | Discharge status: Home / Self Care | Payer: Federal, State, Local not specified - PPO | Source: Ambulatory Visit | Attending: Family Medicine | Admitting: Family Medicine

## 2023-06-12 DIAGNOSIS — Z1231 Encounter for screening mammogram for malignant neoplasm of breast: Secondary | ICD-10-CM | POA: Diagnosis not present

## 2023-06-29 ENCOUNTER — Ambulatory Visit: Payer: Federal, State, Local not specified - PPO | Admitting: Cardiovascular Disease

## 2023-08-04 DIAGNOSIS — G4733 Obstructive sleep apnea (adult) (pediatric): Secondary | ICD-10-CM | POA: Diagnosis not present

## 2023-08-05 ENCOUNTER — Ambulatory Visit: Admit: 2023-08-05 | Payer: BLUE CROSS/BLUE SHIELD | Primary: Family Medicine

## 2023-08-05 ENCOUNTER — Ambulatory Visit
Admit: 2023-08-05 | Discharge: 2023-08-05 | Payer: BLUE CROSS/BLUE SHIELD | Attending: Physician Assistant | Primary: Family Medicine

## 2023-08-05 VITALS — BP 128/78 | HR 84 | Temp 98.70000°F | Resp 20 | Ht 67.01 in | Wt 212.0 lb

## 2023-08-05 DIAGNOSIS — R059 Cough, unspecified: Secondary | ICD-10-CM

## 2023-08-05 DIAGNOSIS — J209 Acute bronchitis, unspecified: Secondary | ICD-10-CM

## 2023-08-05 MED ORDER — BENZONATATE 100 MG PO CAPS
100 | ORAL_CAPSULE | Freq: Three times a day (TID) | ORAL | 0 refills | Status: AC | PRN
Start: 2023-08-05 — End: 2023-08-12

## 2023-08-05 MED ORDER — PREDNISONE 10 MG PO TABS
10 | ORAL_TABLET | ORAL | 0 refills | Status: AC
Start: 2023-08-05 — End: ?

## 2023-08-05 MED ORDER — CEFDINIR 300 MG PO CAPS
300 | ORAL_CAPSULE | Freq: Two times a day (BID) | ORAL | 0 refills | Status: AC
Start: 2023-08-05 — End: 2023-08-15

## 2023-08-05 MED ORDER — ALBUTEROL SULFATE HFA 108 (90 BASE) MCG/ACT IN AERS
10890 | Freq: Four times a day (QID) | RESPIRATORY_TRACT | 0 refills | Status: AC | PRN
Start: 2023-08-05 — End: ?

## 2023-08-05 MED ORDER — AZITHROMYCIN 250 MG PO TABS
250 | ORAL_TABLET | ORAL | 0 refills | Status: AC
Start: 2023-08-05 — End: 2023-08-15

## 2023-08-05 NOTE — Progress Notes (Signed)
08/05/23  Lauren Hull DOB: 1966/04/11 Sex: female  Age 58 y.o.      Subjective:  Chief Complaint   Patient presents with    Cough    Chest Congestion    Headache         HPI:     History of Present Illness  The patient is a 58 year old female who presents for evaluation of cough, congestion, and drainage.    She reports experiencing a general feeling of malaise, accompanied by a cough, nasal congestion, and postnasal drainage. She has been suffering from a headache for the past two days and has noticed blood in her nasal secretions upon blowing her nose. Her primary care physician expressed concern over a low oxygen saturation level recorded earlier today, which prompted a recommendation for hospital admission. However, she declined this advice and upon returning home, she rechecked her oxygen saturation level and found it to be within normal limits at 96%. She was tested for influenza this morning, with negative results. No radiographic imaging was performed. She does not use inhalers except when ill. She has no history of thromboembolic events. She has not received the influenza vaccine this season but has previously been vaccinated against pneumonia. She is under the care of Dr. Remi Deter. She recalls a similar episode in the past where she was prescribed two antibiotics, resulting in symptom resolution within two days.    SOCIAL HISTORY  She admits to smoking since Monday.    IMMUNIZATIONS  She has received the pneumonia vaccine.            ROS:   Unless otherwise stated in this report the patient's positive and negative responses for review of systems for constitutional, eyes, ENT, cardiovascular, respiratory, gastrointestinal, neurological, GU, musculoskeletal, and integument systems and related systems to the presenting problem are either stated in the history of present illness or were not pertinent or were negative for the symptoms and/or complaints related to the presenting medical problem.   Positives and pertinent negatives as per HPI.  All others reviewed and are negative.      PMH:     Past Medical History:   Diagnosis Date    Anxiety     Arthritis     Bronchitis     Leg pain     PONV (postoperative nausea and vomiting)     with anesthesia       Past Surgical History:   Procedure Laterality Date    BACK SURGERY  2019    ECTOPIC PREGNANCY SURGERY  1986    JOINT REPLACEMENT Left 06/2019    hip    NERVE BLOCK Left 07/20/2015    transforaminal nerve, lumbar #1    NERVE BLOCK Left 08/17/2015    NERVE BLOCK Left 09/06/2015    lumbar tfnb #3    NERVE BLOCK Left 11/01/2015    medial branch block, lumbar #1    NERVE BLOCK Right 11/15/2015    MBB #2    NERVE BLOCK Left 02/06/2016    lumbar radiofrequency     TUBAL LIGATION      UPPER GASTROINTESTINAL ENDOSCOPY N/A 03/16/2020    EGD BIOPSY performed by Loreli Slot, MD at [facility] ENDOSCOPY    VEIN SURGERY      VEIN SURGERY Left 02/15/2021    STAB PHLEBECTOMIES LEFT LOWER EXTREMITY performed by Jinny Blossom, MD at [facility] OR    VEIN SURGERY Right 03/21/2021    RIGHT LOWER EXTREMITY STAB PHLEBECTOMY performed by Jinny Blossom, MD  at [facility] OR       History reviewed. No pertinent family history.    Medications:     Current Outpatient Medications:     cefdinir (OMNICEF) 300 MG capsule, Take 1 capsule by mouth 2 times daily for 10 days, Disp: 20 capsule, Rfl: 0    azithromycin (ZITHROMAX) 250 MG tablet, 500mg  on day 1 followed by 250mg  on days 2 - 5, Disp: 6 tablet, Rfl: 0    predniSONE (DELTASONE) 10 MG tablet, 3 tabs once daily for 3 days, 2 tabs once daily for 3 days, 1 tab once daily for 3 days, Disp: 18 tablet, Rfl: 0    benzonatate (TESSALON) 100 MG capsule, Take 1 capsule by mouth 3 times daily as needed for Cough, Disp: 21 capsule, Rfl: 0    albuterol sulfate HFA (PROVENTIL;VENTOLIN;PROAIR) 108 (90 Base) MCG/ACT inhaler, Inhale 2 puffs into the lungs 4 times daily as needed for Wheezing, Disp: 18 g, Rfl: 0    Allergies:     Allergies   Allergen Reactions     Ibuprofen Shortness Of Breath and Itching       Social History:     Social History     Tobacco Use    Smoking status: Every Day     Current packs/day: 0.50     Average packs/day: 0.5 packs/day for 30.0 years (15.0 ttl pk-yrs)     Types: Cigarettes    Smokeless tobacco: Never   Vaping Use    Vaping status: Never Used   Substance Use Topics    Alcohol use: No    Drug use: No       Patient lives at home.    Physical Exam:     Vitals:    08/05/23 1250   BP: 128/78   Pulse: 84   Resp: 20   Temp: 98.7 F (37.1 C)   SpO2: 96%   Weight: 96.2 kg (212 lb)   Height: 1.702 m (5' 7.01")       Exam:  Physical Exam  Nurse's notes and vital signs reviewed. The patient is not hypoxic.  ?  General: Alert, no acute distress, patient resting comfortably Patient is not toxic or lethargic.  Skin: Warm, intact, no pallor noted. There is no evidence of rash at this time.  Head: Normocephalic, atraumatic  Eye: Normal conjunctiva  Ears, Nose, Throat: Right tympanic membrane clear, left tympanic membrane clear. No drainage or discharge noted. No pre- or post-auricular tenderness, erythema, or swelling noted.   Mild congestion  Posterior oropharynx shows no erythema, tonsillar hypertrophy, or exudate. the uvula is midline. No trismus or drooling is noted.   Moist mucous membranes.  Neck: No anterior/posterior lymphadenopathy noted. No erythema, no masses, no fluctuance or induration noted. No meningeal signs.  Cardiovascular: Regular Rate and Rhythm  Respiratory: No acute distress, wheezing and rhonchi noted throughout. No stridor or retractions are noted.  Neurological: A&O x4, normal speech  Psychiatric: Cooperative       Testing:     No results found for this visit on 08/05/23.        Medical Decision Making:     Vital signs reviewed    Past medical history reviewed.    Allergies reviewed.    Medications reviewed.    Patient on arrival does not appear to be in any apparent distress or discomfort.  The patient has been seen and evaluated.   The patient does not appear to be toxic or lethargic.     Patient  was sent for chest x-ray that did not show any evidence of acute cardiopulmonary process.    We did ambulate the patient and she started off at 93% drops down to 91% but she states that she is not short of breath.    The patient does not want to go to the emergency department that was recommended by her PCP.  The patient will be treated with azithromycin, cefdinir, prednisone, Tessalon Perles and albuterol.  The patient will follow-up with PCP.  Call with any questions.  Would recommend monitoring pulse ox closely and if it goes into the 80s she needs to go to the emergency department department for further evaluation.    The patient was educated on the proper dosage of motrin and tylenol and the appropriate intervals of each. The patient is to increase fluid intake over the next several days. The patient is to use OTC decongestant as needed.     The patient is to return to express care or go directly to the emergency department should any of the signs or symptoms worsen. The patient is to followup with primary care physician in 2-3 days for repeat evaluation. The patient has no other questions or concerns at this time the patient will be discharged home.      Clinical Impression:   Lauren Hull" was seen today for cough, chest congestion and headache.    Diagnoses and all orders for this visit:    Acute bronchitis, unspecified organism    Cough, unspecified type  -     XR CHEST (2 VW); Future    Tobacco abuse    Nasal congestion    Other orders  -     cefdinir (OMNICEF) 300 MG capsule; Take 1 capsule by mouth 2 times daily for 10 days  -     azithromycin (ZITHROMAX) 250 MG tablet; 500mg  on day 1 followed by 250mg  on days 2 - 5  -     predniSONE (DELTASONE) 10 MG tablet; 3 tabs once daily for 3 days, 2 tabs once daily for 3 days, 1 tab once daily for 3 days  -     benzonatate (TESSALON) 100 MG capsule; Take 1 capsule by mouth 3 times daily as needed  for Cough  -     albuterol sulfate HFA (PROVENTIL;VENTOLIN;PROAIR) 108 (90 Base) MCG/ACT inhaler; Inhale 2 puffs into the lungs 4 times daily as needed for Wheezing        The patient is to call for any concerns or return if any of the signs or symptoms worsen. The patient is to follow-up with PCP in the next 2-3 days for repeat evaluation repeat assessment or go directly to the emergency department.     SIGNATURE: Alben Spittle III, PA-C    This document may have been prepared at least partially through the use of voice recognition software. Although effort is taken to assure the accuracy ofthis document, it is possible that grammatical, syntax, or spelling errors may occur.     Tobacco use can lead to tobacco/nicotine dependence and serious health problems. Greater than three minutes discussing quitting smoking greatly reduces the risk of developing smoking-related diseases. Lowered risk for lung cancer and many other types of cancer. Reduced risk for heart disease, stroke, and peripheral vascular disease (narrowing of the blood vessels outside your heart). Reduced heart disease risk within 1 to 2 years of quitting. Reduced respiratory symptoms, such as coughing, wheezing, and shortness of breath. While these symptoms may not disappear,  they do not continue to progress at the same rate among people who quit compared with those who continue to smoke. Reduced risk of developing some lung diseases (such as chronic obstructive pulmonary disease, also known as COPD, one of the leading causes of death in the [place]). Tobacco/nicotine dependence is a condition that often requires repeated treatments, but there are helpful treatments and resources for quitting.       **This report was transcribed using voice recognition software. The patient (or guardian, if applicable) and other individuals in attendance with the patient were advised that if Artificial Intelligence would be utilized during this visit to record  and process the conversation to generate a clinical note they would be informed prior to start of the visit. The patient (or guardian, if applicable) and other individuals in attendance at the appointment consented to the use of AI if was utilized, including the recording.  Every effort was made to ensure accuracy; however, inadvertent computerized transcription errors may be present.

## 2023-08-05 NOTE — Other (Signed)
X-ray did not show any evidence of acute process continue with the medications as prescribed if not improving Go directly to the emergency department.  Monitor pulse ox.

## 2023-08-13 ENCOUNTER — Other Ambulatory Visit: Payer: Self-pay | Admitting: Student

## 2023-08-13 DIAGNOSIS — R140 Abdominal distension (gaseous): Secondary | ICD-10-CM | POA: Diagnosis not present

## 2023-08-13 DIAGNOSIS — R141 Gas pain: Secondary | ICD-10-CM | POA: Diagnosis not present

## 2023-08-13 DIAGNOSIS — R109 Unspecified abdominal pain: Secondary | ICD-10-CM | POA: Diagnosis not present

## 2023-08-13 DIAGNOSIS — R1011 Right upper quadrant pain: Secondary | ICD-10-CM | POA: Diagnosis not present

## 2023-08-20 ENCOUNTER — Ambulatory Visit
Admission: RE | Admit: 2023-08-20 | Discharge: 2023-08-20 | Disposition: A | Discharge status: Home / Self Care | Payer: Federal, State, Local not specified - PPO | Source: Ambulatory Visit | Attending: Student | Admitting: Student

## 2023-08-20 DIAGNOSIS — R109 Unspecified abdominal pain: Secondary | ICD-10-CM

## 2023-08-24 ENCOUNTER — Other Ambulatory Visit: Payer: Self-pay | Admitting: Student

## 2023-08-24 DIAGNOSIS — R109 Unspecified abdominal pain: Secondary | ICD-10-CM

## 2023-08-26 DIAGNOSIS — F319 Bipolar disorder, unspecified: Secondary | ICD-10-CM | POA: Diagnosis not present

## 2023-08-26 DIAGNOSIS — E039 Hypothyroidism, unspecified: Secondary | ICD-10-CM | POA: Diagnosis not present

## 2023-08-31 ENCOUNTER — Ambulatory Visit
Admission: RE | Admit: 2023-08-31 | Discharge: 2023-08-31 | Disposition: A | Discharge status: Home / Self Care | Source: Ambulatory Visit | Attending: Student | Admitting: Student

## 2023-08-31 DIAGNOSIS — R109 Unspecified abdominal pain: Secondary | ICD-10-CM

## 2023-08-31 DIAGNOSIS — R1084 Generalized abdominal pain: Secondary | ICD-10-CM | POA: Diagnosis not present

## 2023-08-31 MED ORDER — IOPAMIDOL (ISOVUE-300) INJECTION 61%
100.0000 mL | Freq: Once | INTRAVENOUS | Status: AC | PRN
  Administered 2023-08-31: 100 mL via INTRAVENOUS

## 2023-09-22 ENCOUNTER — Other Ambulatory Visit: Payer: Self-pay

## 2023-09-22 ENCOUNTER — Emergency Department (HOSPITAL_BASED_OUTPATIENT_CLINIC_OR_DEPARTMENT_OTHER)
Admission: EM | Admit: 2023-09-22 | Discharge: 2023-09-22 | Disposition: A | Discharge status: Home / Self Care | Attending: Emergency Medicine | Admitting: Emergency Medicine

## 2023-09-22 ENCOUNTER — Encounter (HOSPITAL_BASED_OUTPATIENT_CLINIC_OR_DEPARTMENT_OTHER): Payer: Self-pay

## 2023-09-22 ENCOUNTER — Emergency Department (HOSPITAL_BASED_OUTPATIENT_CLINIC_OR_DEPARTMENT_OTHER): Admitting: Radiology

## 2023-09-22 DIAGNOSIS — Z79899 Other long term (current) drug therapy: Secondary | ICD-10-CM | POA: Diagnosis not present

## 2023-09-22 DIAGNOSIS — R072 Precordial pain: Secondary | ICD-10-CM | POA: Insufficient documentation

## 2023-09-22 DIAGNOSIS — R079 Chest pain, unspecified: Secondary | ICD-10-CM | POA: Diagnosis not present

## 2023-09-22 DIAGNOSIS — E039 Hypothyroidism, unspecified: Secondary | ICD-10-CM | POA: Diagnosis not present

## 2023-09-22 DIAGNOSIS — R42 Dizziness and giddiness: Secondary | ICD-10-CM | POA: Insufficient documentation

## 2023-09-22 LAB — BASIC METABOLIC PANEL WITH GFR
Anion gap: 8 (ref 5–15)
BUN: 15 mg/dL (ref 6–20)
CO2: 27 mmol/L (ref 22–32)
Calcium: 9.8 mg/dL (ref 8.9–10.3)
Chloride: 103 mmol/L (ref 98–111)
Creatinine, Ser: 0.88 mg/dL (ref 0.44–1.00)
GFR, Estimated: >60 mL/min (ref >60)
Glucose, Bld: 111 mg/dL — ABNORMAL HIGH (ref 70–99)
Potassium: 4.1 mmol/L (ref 3.5–5.1)
Sodium: 138 mmol/L (ref 135–145)

## 2023-09-22 LAB — CBC
HCT: 41.4 % (ref 36.0–46.0)
Hemoglobin: 14.1 g/dL (ref 12.0–15.0)
MCH: 29.8 pg (ref 26.0–34.0)
MCHC: 34.1 g/dL (ref 30.0–36.0)
MCV: 87.5 fL (ref 80.0–100.0)
Platelets: 232 10*3/uL (ref 150–400)
RBC: 4.73 MIL/uL (ref 3.87–5.11)
RDW: 12 % (ref 11.5–15.5)
WBC: 6.8 10*3/uL (ref 4.0–10.5)
nRBC: 0 % (ref 0.0–0.2)

## 2023-09-22 LAB — TROPONIN I (HIGH SENSITIVITY): Troponin I (High Sensitivity): <2 ng/L (ref <18)

## 2023-09-22 MED ORDER — MECLIZINE HCL 25 MG PO TABS: 25.0000 mg | ORAL_TABLET | Freq: Three times a day (TID) | ORAL | 0 refills | Status: AC | PRN

## 2023-09-22 NOTE — ED Provider Notes (Signed)
 [place] EMERGENCY DEPARTMENT AT [facility] Provider Note   CSN: [id number] Arrival date & time: 09/22/23  1005     History  Chief Complaint  Patient presents with   Dizziness   Chest Pain    Alyssa Holmes is a 58 y.o. female.   Dizziness Associated symptoms: chest pain   Chest Pain Associated symptoms: dizziness    Patient is a 58 year old female presents ED today with complaints of intermittent central sternal pressure and vertigo for the last 24 hours.  States that she was at work today where she began to feel the spinning sensation and was accompanied with chest pressure and mild shortness of breath.  Reports that she had forgotten to take her antidepressant pill this morning.  Previous medical history of MVP, bipolar disorder, hypothyroidism, palpitations, chronic headaches, anxiety, pituitary microadenoma.  Denies fever, cough, congestion, chest pain, abdominal pain, nausea, vomiting, diarrhea, dysuria, vaginal discharge, lower leg swelling, recent illness, trauma.     Home Medications Prior to Admission medications   Medication Sig Start Date End Date Taking? Authorizing Provider  meclizine (ANTIVERT) 25 MG tablet Take 1 tablet (25 mg total) by mouth 3 (three) times daily as needed for dizziness. 09/22/23  Yes Lunette Stands, PA-C  acetaminophen (TYLENOL) 500 MG tablet Take 1,000 mg by mouth every 6 (six) hours as needed for mild pain.    [provider]  B Complex-C (B-COMPLEX WITH VITAMIN C) tablet Take 1 tablet by mouth daily.    [provider]  Biotin 5000 MCG CAPS Take 1 capsule by mouth daily.    [provider]  BIOTIN PO Take 1 tablet by mouth daily.    [provider]  Cholecalciferol (VITAMIN D3) 125 MCG (5000 UT) CAPS Take 2 capsules by mouth daily.    [provider]  Cholecalciferol (VITAMIN D3) 2000 UNITS TABS Take 2 tablets by mouth daily.     [provider]  ezetimibe (ZETIA) 10 MG  tablet Take 10 mg by mouth daily.    [provider]  lamoTRIgine (LAMICTAL) 200 MG tablet Take 1 tablet by mouth every evening. 09/25/18   [provider]  levothyroxine (SYNTHROID) 88 MCG tablet Take 88 mcg by mouth daily. 05/13/20   [provider]  loratadine (CLARITIN) 10 MG tablet Take 1 tablet by mouth as needed.    [provider]  MAGNESIUM PO Take 1 tablet by mouth daily.    [provider]  metoprolol succinate (TOPROL-XL) 25 MG 24 hr tablet Take 1 tablet (25 mg total) by mouth daily. 06/04/23   Nahser, Deloris Ping, MD  metroNIDAZOLE (METROCREAM) 0.75 % cream Apply 1 application topically as needed. 06/22/20   [provider]  NIACIN PO Take 500 mg by mouth daily.     [provider]  venlafaxine XR (EFFEXOR-XR) 150 MG 24 hr capsule Take 1 capsule by mouth daily. 01/26/19   [provider]      Allergies    Vancomycin, Ibuprofen, Oxycodone-acetaminophen, Penicillins, and Toradol [ketorolac tromethamine]    Review of Systems   Review of Systems  Cardiovascular:  Positive for chest pain.  Neurological:  Positive for dizziness.  All other systems reviewed and are negative.   Physical Exam Updated Vital Signs BP 128/71 (BP Location: Right Arm)   Pulse 66   Temp 97.8 F (36.6 C) (Oral)   Resp 18   Ht 5\' 5"  (1.651 m)   Wt 67.1 kg   SpO2 100%  BMI 24.63 kg/m  Physical Exam Vitals and nursing note reviewed.  Constitutional:      General: She is not in acute distress.    Appearance: Normal appearance. She is not ill-appearing.  HENT:     Head: Normocephalic and atraumatic.     Right Ear: Tympanic membrane, ear canal and external ear normal.     Left Ear: Tympanic membrane and external ear normal.     Ears:     Comments: Noted have some mild irritation to the left ear canal with no swelling or drainage.    Nose: No congestion.     Mouth/Throat:     Mouth: Mucous membranes are moist.     Pharynx:  Oropharynx is clear. No oropharyngeal exudate or posterior oropharyngeal erythema.  Eyes:     General: No scleral icterus.       Right eye: No discharge.        Left eye: No discharge.     Extraocular Movements: Extraocular movements intact.     Conjunctiva/sclera: Conjunctivae normal.     Pupils: Pupils are equal, round, and reactive to light.     Comments: No nystagmus  Cardiovascular:     Rate and Rhythm: Normal rate and regular rhythm.     Pulses: Normal pulses.     Heart sounds: Normal heart sounds. No murmur heard.    No friction rub. No gallop.  Pulmonary:     Effort: Pulmonary effort is normal. No respiratory distress.     Breath sounds: No decreased breath sounds, wheezing, rhonchi or rales.  Abdominal:     General: Abdomen is flat. There is no distension.     Palpations: Abdomen is soft.     Tenderness: There is no abdominal tenderness. There is no right CVA tenderness, left CVA tenderness or guarding.  Musculoskeletal:        General: No tenderness or deformity.     Cervical back: Normal range of motion and neck supple. No rigidity or tenderness.     Right lower leg: No tenderness. No edema.     Left lower leg: No tenderness. No edema.  Skin:    General: Skin is warm and dry.     Findings: No ecchymosis, erythema, lesion or rash.  Neurological:     General: No focal deficit present.     Mental Status: She is alert and oriented to person, place, and time. Mental status is at baseline.     Cranial Nerves: No cranial nerve deficit.     Sensory: No sensory deficit.     Motor: No weakness.     Coordination: Coordination normal.     Gait: Gait normal.  Psychiatric:        Mood and Affect: Mood normal.     ED Results / Procedures / Treatments   Labs (all labs ordered are listed, but only abnormal results are displayed) Labs Reviewed  BASIC METABOLIC PANEL WITH GFR - Abnormal; Notable for the following components:      Result Value   Glucose, Bld 111 (*)    All  other components within normal limits  CBC  TROPONIN I (HIGH SENSITIVITY)    EKG EKG Interpretation Date/Time:  Tuesday September 22 2023 10:15:23 EDT Ventricular Rate:  65 PR Interval:  210 QRS Duration:  136 QT Interval:  442 QTC Calculation: 459 R Axis:   103  Text Interpretation: Sinus rhythm with 1st degree A-V block Confirmed by Virgina Norfolk [phone]) on 09/22/2023 12:25:33 PM  Radiology DG  Chest 2 View Result Date: 09/22/2023 CLINICAL DATA:  chest pain EXAM: CHEST - 2 VIEW COMPARISON:  12/11/2020 FINDINGS: Lungs are clear.  No pneumothorax. Heart size and mediastinal contours are within normal limits. No effusion. Visualized bones unremarkable. IMPRESSION: No acute cardiopulmonary disease. Electronically Signed   By: Corlis Leak M.D.   On: 09/22/2023 10:43    Procedures Procedures    Medications Ordered in ED Medications - No data to display  ED Course/ Medical Decision Making/ A&P                             Geneva (Revised) Score: 0, Geneva Score Interpretation: Low Risk Group: 7-9% incidence of pulmonary embolism from several studies   Medical Decision Making Amount and/or Complexity of Data Reviewed Labs: ordered. Radiology: ordered.   This patient is a 58 year old female who presents to the ED for concern of 24-hour of intermittent vertigo and chest pressure.  Patient currently asymptomatic at this time.  On exam, patient is noted to be in no acute afebrile, speaking full sentences, nontachypneic, nontachycardic, alert and x 4, unremarkable imaging neuroexam with normal strength with flexion and extension to both upper and lower extremities, normal sensation bilaterally with no sensory deficits, no ataxia, no apraxia, no aphasia, no facial asymmetry.  TMs normal bilaterally.  Left ear canal did show some mild irritation with no swelling or drainage, patient endorses using Q-tips. Oropharynx unremarkable.  LCTAB.  Normal heart sounds RRR, no M/C/R/G.  No abdominal  tenderness noted palpation.  No lower leg swelling.  Unremarkable physical exam.   Labs were all unremarkable.  With negative atypical chest pain workup. Do not believe that CT imaging is warranted at this time with negative neuroexam.  Believe that symptoms most akin to peripheral vertigo as episodes are positional dependent and intermittent.  Will prescribe meclizine as well as follow-up with ENT if her symptoms persist.  Provided strict turn to ED precautions that if any neurological symptoms present, she would need to return for further imaging at that time.  Patient vital signs have remained stable throughout the course of patient's time in the ED. Low suspicion for any other emergent pathology at this time. I believe this patient is safe to be discharged. Provided strict return to ER precautions. Patient expressed agreement and understanding of plan. All questions were answered.  Differential diagnoses prior to evaluation: The emergent differential diagnosis includes, but is not limited to,  BPPV, thyroiditis, gastroenteritis, Ramsay Hunt syndrome, Mnire's disease, CVA, anemia, anxiety . This is not an exhaustive differential.   Past Medical History / Co-morbidities / Social History: MVP, bipolar disorder, hypothyroidism, palpitations, chronic headaches, anxiety, pituitary microadenoma.  Additional history: Chart reviewed. Pertinent results include:   Seen for palpitations on 03/03/2023 and was placed on Toprol for tachycardia and to help prevent memory issues that she was experiencing with metoprolol.   CT of the abdomen pelvis on 09/03/2023 which showed a 9 mm nodule on the spleen with accessory splenule.  CT chest was done on 03/30/2023 for cardiac scoring and which showed to have coronary calcium score of 0, minimal aortic atherosclerosis  Noted to have an MRI in 2022 of the brain which showed normal MRI of pituitary gland  Lab Tests/Imaging studies: I personally interpreted  labs/imaging and the pertinent results include:   CBC unremarkable BMP unremarkable Troponin unremarkable Chest x-ray unremarkable I agree with the radiologist interpretation.  Cardiac monitoring: EKG obtained and interpreted by  myself and attending physician which shows: Sinus rhythm with first-degree AV block   EKG Interpretation Date/Time:  Tuesday September 22 2023 10:15:23 EDT Ventricular Rate:  65 PR Interval:  210 QRS Duration:  136 QT Interval:  442 QTC Calculation: 459 R Axis:   103  Text Interpretation: Sinus rhythm with 1st degree A-V block Confirmed by Virgina Norfolk [phone]) on 09/22/2023 12:25:33 PM          Medications: I ordered medication including meclizine.  I have reviewed the patients home medicines and have made adjustments as needed.  Disposition: After consideration of the diagnostic results and the patients response to treatment, I feel that the patient would benefit from discharge after above.   emergency department workup does not suggest an emergent condition requiring admission or immediate intervention beyond what has been performed at this time. The plan is: Follow-up with ENT, return for new or worsening symptoms. The patient is safe for discharge and has been instructed to return immediately for worsening symptoms, change in symptoms or any other concerns.  Final Clinical Impression(s) / ED Diagnoses Final diagnoses:  Vertigo    Rx / DC Orders ED Discharge Orders          Ordered    meclizine (ANTIVERT) 25 MG tablet  3 times daily PRN        09/22/23 1248              Lunette Stands, [place] 09/22/23 1249    Virgina Norfolk, DO 09/22/23 1302

## 2023-09-22 NOTE — ED Notes (Signed)
Sent urine specimen to the lab

## 2023-09-22 NOTE — ED Triage Notes (Signed)
 Patient arrives POV with complaints of worsening dizziness x2 days and central chest pain that started today. Rates her pain a 4/10.

## 2023-09-22 NOTE — Discharge Instructions (Signed)
 You were seen today for vertigo and chest pressure.  Your cardiac workup today was reassuring and your imaging was also reassuring.  Suspect that this is a peripheral cause of vertigo that you are experiencing today causing the spinning sensation that you been feeling.  I will prescribe meclizine for you to begin to take for this.  I have also given instructions on how to perform the Epley maneuver which can also help relieve symptoms.  I am attaching the ENTs information to this AVS for you to be able to reach out to them if you have had continued symptoms.  Return to the ED if you begin to have any new or worsening symptoms including fever, worsening headache, chest pain, shortness of breath, nausea and vomiting particularly in the morning, constant vertigo that is not relieved.

## 2023-09-29 DIAGNOSIS — L720 Epidermal cyst: Secondary | ICD-10-CM | POA: Diagnosis not present

## 2023-10-26 DIAGNOSIS — R140 Abdominal distension (gaseous): Secondary | ICD-10-CM | POA: Diagnosis not present

## 2023-10-26 DIAGNOSIS — R634 Abnormal weight loss: Secondary | ICD-10-CM | POA: Diagnosis not present

## 2023-10-26 DIAGNOSIS — R110 Nausea: Secondary | ICD-10-CM | POA: Diagnosis not present

## 2023-10-26 DIAGNOSIS — K5900 Constipation, unspecified: Secondary | ICD-10-CM | POA: Diagnosis not present

## 2023-10-26 DIAGNOSIS — R142 Eructation: Secondary | ICD-10-CM | POA: Diagnosis not present

## 2023-10-26 DIAGNOSIS — R109 Unspecified abdominal pain: Secondary | ICD-10-CM | POA: Diagnosis not present

## 2023-10-30 DIAGNOSIS — M9901 Segmental and somatic dysfunction of cervical region: Secondary | ICD-10-CM | POA: Diagnosis not present

## 2023-10-30 DIAGNOSIS — M9904 Segmental and somatic dysfunction of sacral region: Secondary | ICD-10-CM | POA: Diagnosis not present

## 2023-10-30 DIAGNOSIS — M9903 Segmental and somatic dysfunction of lumbar region: Secondary | ICD-10-CM | POA: Diagnosis not present

## 2023-10-30 DIAGNOSIS — M531 Cervicobrachial syndrome: Secondary | ICD-10-CM | POA: Diagnosis not present

## 2023-10-30 DIAGNOSIS — M9902 Segmental and somatic dysfunction of thoracic region: Secondary | ICD-10-CM | POA: Diagnosis not present

## 2023-11-02 DIAGNOSIS — J019 Acute sinusitis, unspecified: Secondary | ICD-10-CM | POA: Diagnosis not present

## 2023-11-02 DIAGNOSIS — J40 Bronchitis, not specified as acute or chronic: Secondary | ICD-10-CM | POA: Diagnosis not present

## 2023-12-16 DIAGNOSIS — M9902 Segmental and somatic dysfunction of thoracic region: Secondary | ICD-10-CM | POA: Diagnosis not present

## 2023-12-16 DIAGNOSIS — M531 Cervicobrachial syndrome: Secondary | ICD-10-CM | POA: Diagnosis not present

## 2023-12-16 DIAGNOSIS — M9903 Segmental and somatic dysfunction of lumbar region: Secondary | ICD-10-CM | POA: Diagnosis not present

## 2023-12-16 DIAGNOSIS — M9901 Segmental and somatic dysfunction of cervical region: Secondary | ICD-10-CM | POA: Diagnosis not present

## 2024-01-27 DIAGNOSIS — M9902 Segmental and somatic dysfunction of thoracic region: Secondary | ICD-10-CM | POA: Diagnosis not present

## 2024-01-27 DIAGNOSIS — M542 Cervicalgia: Secondary | ICD-10-CM | POA: Diagnosis not present

## 2024-01-27 DIAGNOSIS — M546 Pain in thoracic spine: Secondary | ICD-10-CM | POA: Diagnosis not present

## 2024-01-27 DIAGNOSIS — M9901 Segmental and somatic dysfunction of cervical region: Secondary | ICD-10-CM | POA: Diagnosis not present

## 2024-01-29 DIAGNOSIS — M9901 Segmental and somatic dysfunction of cervical region: Secondary | ICD-10-CM | POA: Diagnosis not present

## 2024-01-29 DIAGNOSIS — M542 Cervicalgia: Secondary | ICD-10-CM | POA: Diagnosis not present

## 2024-01-29 DIAGNOSIS — M546 Pain in thoracic spine: Secondary | ICD-10-CM | POA: Diagnosis not present

## 2024-01-29 DIAGNOSIS — M9902 Segmental and somatic dysfunction of thoracic region: Secondary | ICD-10-CM | POA: Diagnosis not present

## 2024-02-02 DIAGNOSIS — M542 Cervicalgia: Secondary | ICD-10-CM | POA: Diagnosis not present

## 2024-02-02 DIAGNOSIS — M9902 Segmental and somatic dysfunction of thoracic region: Secondary | ICD-10-CM | POA: Diagnosis not present

## 2024-02-02 DIAGNOSIS — M546 Pain in thoracic spine: Secondary | ICD-10-CM | POA: Diagnosis not present

## 2024-02-02 DIAGNOSIS — M9901 Segmental and somatic dysfunction of cervical region: Secondary | ICD-10-CM | POA: Diagnosis not present

## 2024-02-03 DIAGNOSIS — M546 Pain in thoracic spine: Secondary | ICD-10-CM | POA: Diagnosis not present

## 2024-02-03 DIAGNOSIS — M9902 Segmental and somatic dysfunction of thoracic region: Secondary | ICD-10-CM | POA: Diagnosis not present

## 2024-02-03 DIAGNOSIS — M9901 Segmental and somatic dysfunction of cervical region: Secondary | ICD-10-CM | POA: Diagnosis not present

## 2024-02-03 DIAGNOSIS — M542 Cervicalgia: Secondary | ICD-10-CM | POA: Diagnosis not present

## 2024-02-08 DIAGNOSIS — M9901 Segmental and somatic dysfunction of cervical region: Secondary | ICD-10-CM | POA: Diagnosis not present

## 2024-02-08 DIAGNOSIS — M542 Cervicalgia: Secondary | ICD-10-CM | POA: Diagnosis not present

## 2024-02-08 DIAGNOSIS — M9902 Segmental and somatic dysfunction of thoracic region: Secondary | ICD-10-CM | POA: Diagnosis not present

## 2024-02-08 DIAGNOSIS — M546 Pain in thoracic spine: Secondary | ICD-10-CM | POA: Diagnosis not present

## 2024-02-09 DIAGNOSIS — M546 Pain in thoracic spine: Secondary | ICD-10-CM | POA: Diagnosis not present

## 2024-02-09 DIAGNOSIS — M9902 Segmental and somatic dysfunction of thoracic region: Secondary | ICD-10-CM | POA: Diagnosis not present

## 2024-02-09 DIAGNOSIS — M542 Cervicalgia: Secondary | ICD-10-CM | POA: Diagnosis not present

## 2024-02-09 DIAGNOSIS — M9901 Segmental and somatic dysfunction of cervical region: Secondary | ICD-10-CM | POA: Diagnosis not present

## 2024-02-16 DIAGNOSIS — M9901 Segmental and somatic dysfunction of cervical region: Secondary | ICD-10-CM | POA: Diagnosis not present

## 2024-02-16 DIAGNOSIS — E039 Hypothyroidism, unspecified: Secondary | ICD-10-CM | POA: Diagnosis not present

## 2024-02-16 DIAGNOSIS — M9902 Segmental and somatic dysfunction of thoracic region: Secondary | ICD-10-CM | POA: Diagnosis not present

## 2024-02-16 DIAGNOSIS — D649 Anemia, unspecified: Secondary | ICD-10-CM | POA: Diagnosis not present

## 2024-02-16 DIAGNOSIS — M546 Pain in thoracic spine: Secondary | ICD-10-CM | POA: Diagnosis not present

## 2024-02-16 DIAGNOSIS — Z0000 Encounter for general adult medical examination without abnormal findings: Secondary | ICD-10-CM | POA: Diagnosis not present

## 2024-02-16 DIAGNOSIS — M542 Cervicalgia: Secondary | ICD-10-CM | POA: Diagnosis not present

## 2024-02-16 DIAGNOSIS — K861 Other chronic pancreatitis: Secondary | ICD-10-CM | POA: Diagnosis not present

## 2024-02-16 DIAGNOSIS — Z23 Encounter for immunization: Secondary | ICD-10-CM | POA: Diagnosis not present

## 2024-02-16 DIAGNOSIS — Z131 Encounter for screening for diabetes mellitus: Secondary | ICD-10-CM | POA: Diagnosis not present

## 2024-02-16 DIAGNOSIS — E785 Hyperlipidemia, unspecified: Secondary | ICD-10-CM | POA: Diagnosis not present

## 2024-02-17 DIAGNOSIS — M9902 Segmental and somatic dysfunction of thoracic region: Secondary | ICD-10-CM | POA: Diagnosis not present

## 2024-02-17 DIAGNOSIS — M546 Pain in thoracic spine: Secondary | ICD-10-CM | POA: Diagnosis not present

## 2024-02-17 DIAGNOSIS — M9901 Segmental and somatic dysfunction of cervical region: Secondary | ICD-10-CM | POA: Diagnosis not present

## 2024-02-17 DIAGNOSIS — M542 Cervicalgia: Secondary | ICD-10-CM | POA: Diagnosis not present

## 2024-02-23 DIAGNOSIS — M546 Pain in thoracic spine: Secondary | ICD-10-CM | POA: Diagnosis not present

## 2024-02-23 DIAGNOSIS — M9902 Segmental and somatic dysfunction of thoracic region: Secondary | ICD-10-CM | POA: Diagnosis not present

## 2024-02-23 DIAGNOSIS — M542 Cervicalgia: Secondary | ICD-10-CM | POA: Diagnosis not present

## 2024-02-23 DIAGNOSIS — M9901 Segmental and somatic dysfunction of cervical region: Secondary | ICD-10-CM | POA: Diagnosis not present

## 2024-03-02 DIAGNOSIS — M542 Cervicalgia: Secondary | ICD-10-CM | POA: Diagnosis not present

## 2024-03-02 DIAGNOSIS — M9901 Segmental and somatic dysfunction of cervical region: Secondary | ICD-10-CM | POA: Diagnosis not present

## 2024-03-02 DIAGNOSIS — M9902 Segmental and somatic dysfunction of thoracic region: Secondary | ICD-10-CM | POA: Diagnosis not present

## 2024-03-02 DIAGNOSIS — M546 Pain in thoracic spine: Secondary | ICD-10-CM | POA: Diagnosis not present

## 2024-03-09 DIAGNOSIS — M546 Pain in thoracic spine: Secondary | ICD-10-CM | POA: Diagnosis not present

## 2024-03-09 DIAGNOSIS — M542 Cervicalgia: Secondary | ICD-10-CM | POA: Diagnosis not present

## 2024-03-09 DIAGNOSIS — M9902 Segmental and somatic dysfunction of thoracic region: Secondary | ICD-10-CM | POA: Diagnosis not present

## 2024-03-09 DIAGNOSIS — M9901 Segmental and somatic dysfunction of cervical region: Secondary | ICD-10-CM | POA: Diagnosis not present

## 2024-03-15 DIAGNOSIS — M9902 Segmental and somatic dysfunction of thoracic region: Secondary | ICD-10-CM | POA: Diagnosis not present

## 2024-03-15 DIAGNOSIS — M9901 Segmental and somatic dysfunction of cervical region: Secondary | ICD-10-CM | POA: Diagnosis not present

## 2024-03-15 DIAGNOSIS — M542 Cervicalgia: Secondary | ICD-10-CM | POA: Diagnosis not present

## 2024-03-15 DIAGNOSIS — M546 Pain in thoracic spine: Secondary | ICD-10-CM | POA: Diagnosis not present

## 2024-03-22 DIAGNOSIS — M9901 Segmental and somatic dysfunction of cervical region: Secondary | ICD-10-CM | POA: Diagnosis not present

## 2024-03-22 DIAGNOSIS — M9902 Segmental and somatic dysfunction of thoracic region: Secondary | ICD-10-CM | POA: Diagnosis not present

## 2024-03-22 DIAGNOSIS — M546 Pain in thoracic spine: Secondary | ICD-10-CM | POA: Diagnosis not present

## 2024-03-22 DIAGNOSIS — M542 Cervicalgia: Secondary | ICD-10-CM | POA: Diagnosis not present

## 2024-04-05 DIAGNOSIS — M542 Cervicalgia: Secondary | ICD-10-CM | POA: Diagnosis not present

## 2024-04-05 DIAGNOSIS — M9901 Segmental and somatic dysfunction of cervical region: Secondary | ICD-10-CM | POA: Diagnosis not present

## 2024-04-05 DIAGNOSIS — M546 Pain in thoracic spine: Secondary | ICD-10-CM | POA: Diagnosis not present

## 2024-04-05 DIAGNOSIS — M9902 Segmental and somatic dysfunction of thoracic region: Secondary | ICD-10-CM | POA: Diagnosis not present

## 2024-04-06 DIAGNOSIS — G2581 Restless legs syndrome: Secondary | ICD-10-CM | POA: Diagnosis not present

## 2024-04-06 DIAGNOSIS — F319 Bipolar disorder, unspecified: Secondary | ICD-10-CM | POA: Diagnosis not present

## 2024-04-06 DIAGNOSIS — F515 Nightmare disorder: Secondary | ICD-10-CM | POA: Diagnosis not present

## 2024-04-06 DIAGNOSIS — G4733 Obstructive sleep apnea (adult) (pediatric): Secondary | ICD-10-CM | POA: Diagnosis not present

## 2024-04-19 DIAGNOSIS — M9902 Segmental and somatic dysfunction of thoracic region: Secondary | ICD-10-CM | POA: Diagnosis not present

## 2024-04-19 DIAGNOSIS — M546 Pain in thoracic spine: Secondary | ICD-10-CM | POA: Diagnosis not present

## 2024-04-19 DIAGNOSIS — M542 Cervicalgia: Secondary | ICD-10-CM | POA: Diagnosis not present

## 2024-04-19 DIAGNOSIS — M9901 Segmental and somatic dysfunction of cervical region: Secondary | ICD-10-CM | POA: Diagnosis not present

## 2024-04-29 DIAGNOSIS — G4733 Obstructive sleep apnea (adult) (pediatric): Secondary | ICD-10-CM | POA: Diagnosis not present

## 2024-05-04 ENCOUNTER — Telehealth: Payer: Self-pay | Admitting: Cardiovascular Disease

## 2024-05-04 DIAGNOSIS — M546 Pain in thoracic spine: Secondary | ICD-10-CM | POA: Diagnosis not present

## 2024-05-04 DIAGNOSIS — M9901 Segmental and somatic dysfunction of cervical region: Secondary | ICD-10-CM | POA: Diagnosis not present

## 2024-05-04 DIAGNOSIS — M542 Cervicalgia: Secondary | ICD-10-CM | POA: Diagnosis not present

## 2024-05-04 DIAGNOSIS — M9902 Segmental and somatic dysfunction of thoracic region: Secondary | ICD-10-CM | POA: Diagnosis not present

## 2024-05-04 MED ORDER — METOPROLOL SUCCINATE ER 25 MG PO TB24: 25.0000 mg | ORAL_TABLET | Freq: Every day | ORAL | 0 refills | Status: DC

## 2024-05-04 NOTE — Telephone Encounter (Signed)
 Refill sent under Dr. Ginette name since she is DOD #2 today.  Pt aware to call and schedule a follow-up appointment.

## 2024-05-04 NOTE — Telephone Encounter (Signed)
*  STAT* If patient is at the pharmacy, call can be transferred to refill team.   1. Which medications need to be refilled? (please list name of each medication and dose if known)   metoprolol  succinate (TOPROL -XL) 25 MG 24 hr tablet   Take 1 tablet (25 mg total) by mouth daily.   2. Would you like to learn more about the convenience, safety, & potential cost savings by using the [facility]? No     3. Are you open to using the [facility] No  4. Which pharmacy/location (including street and city if local pharmacy) is medication to be sent to? [id number] - [place], [place] - [address] [place] [place]    5. Do they need a 30 day or 90 day supply? 90 Day Supply  Pt is currently out of medication

## 2024-05-05 DIAGNOSIS — K219 Gastro-esophageal reflux disease without esophagitis: Secondary | ICD-10-CM | POA: Diagnosis not present

## 2024-05-05 DIAGNOSIS — K2950 Unspecified chronic gastritis without bleeding: Secondary | ICD-10-CM | POA: Diagnosis not present

## 2024-05-05 DIAGNOSIS — R1013 Epigastric pain: Secondary | ICD-10-CM | POA: Diagnosis not present

## 2024-05-18 ENCOUNTER — Encounter: Payer: Self-pay | Admitting: Internal Medicine

## 2024-05-18 ENCOUNTER — Ambulatory Visit: Attending: Internal Medicine | Admitting: Internal Medicine

## 2024-05-18 VITALS — BP 119/60 | HR 91 | Ht 65.0 in | Wt 153.4 lb

## 2024-05-18 DIAGNOSIS — R079 Chest pain, unspecified: Secondary | ICD-10-CM

## 2024-05-18 DIAGNOSIS — M546 Pain in thoracic spine: Secondary | ICD-10-CM | POA: Diagnosis not present

## 2024-05-18 DIAGNOSIS — E785 Hyperlipidemia, unspecified: Secondary | ICD-10-CM

## 2024-05-18 DIAGNOSIS — M9901 Segmental and somatic dysfunction of cervical region: Secondary | ICD-10-CM | POA: Diagnosis not present

## 2024-05-18 DIAGNOSIS — I341 Nonrheumatic mitral (valve) prolapse: Secondary | ICD-10-CM | POA: Diagnosis not present

## 2024-05-18 DIAGNOSIS — R002 Palpitations: Secondary | ICD-10-CM | POA: Diagnosis not present

## 2024-05-18 DIAGNOSIS — M9902 Segmental and somatic dysfunction of thoracic region: Secondary | ICD-10-CM | POA: Diagnosis not present

## 2024-05-18 DIAGNOSIS — M542 Cervicalgia: Secondary | ICD-10-CM | POA: Diagnosis not present

## 2024-05-18 DIAGNOSIS — E7800 Pure hypercholesterolemia, unspecified: Secondary | ICD-10-CM | POA: Diagnosis not present

## 2024-05-18 MED ORDER — METOPROLOL SUCCINATE ER 25 MG PO TB24: 25.0000 mg | ORAL_TABLET | Freq: Every day | ORAL | 4 refills | Status: AC

## 2024-05-18 NOTE — Progress Notes (Signed)
 Cardiology Office Note   Date:  05/18/2024  ID:  Myndi, Wamble [birth year], MRN [id number] PCP: Lanis Thresa JAYSON DEVONNA  [facility] Providers Cardiologist:  Emeline FORBES Calender, DO     History of Present Illness Alyssa Holmes is a 58 y.o. female who is a previous patient of Dr. Alveta with past medical history of mitral valve prolapse, OSA, hypothyroidism, dyslipidemia, bipolar disorder, palpitations consistent with PVCs who presents today for follow-up.  At her last visit with Dr. Alveta on 03/03/2023 her metoprolol  succinate was changed to bisoprolol  2.5 mg daily and a coronary calcium score was 0.  She did not have improvement with the bisoprolol  and was switched back to metoprolol  succinate 25 mg and has been doing well on this medication.  Requesting refills of the metoprolol .  She states that she occasionally gets exertional chest discomfort and shortness of breath with at least moderate exertion.  She had a coronary calcium score that was 0 yesterday.  Says that her chest discomfort is reproducible with laying flat as well and was started on pantoprazole  and sucralfate  recently with significant improvement in symptoms.  Symptoms have been chronic and not changing in quality.  Denies tobacco use and has occasional alcohol use.    ROS:  Review of Systems  All other systems reviewed and are negative.   Physical Exam  Physical Exam Vitals and nursing note reviewed.  Constitutional:      Appearance: Normal appearance.  HENT:     Head: Normocephalic and atraumatic.  Eyes:     Conjunctiva/sclera: Conjunctivae normal.  Neck:     Vascular: No carotid bruit.  Cardiovascular:     Rate and Rhythm: Normal rate and regular rhythm.  Pulmonary:     Effort: Pulmonary effort is normal.     Breath sounds: Normal breath sounds.  Musculoskeletal:        General: No swelling or tenderness.  Skin:    Coloration: Skin is not jaundiced or pale.  Neurological:      Mental Status: She is alert.     VS:  BP 119/60   Pulse 91   Ht 5' 5 (1.651 m)   Wt 153 lb 6.4 oz (69.6 kg)   SpO2 97%   BMI 25.53 kg/m         Wt Readings from Last 3 Encounters:  05/18/24 153 lb 6.4 oz (69.6 kg)  09/22/23 148 lb (67.1 kg)  03/03/23 158 lb 6.4 oz (71.8 kg)     EKG Interpretation Date/Time:  Wednesday May 18 2024 08:24:13 EST Ventricular Rate:  91 PR Interval:  198 QRS Duration:  138 QT Interval:  412 QTC Calculation: 506 R Axis:   105  Text Interpretation: Normal sinus rhythm Right bundle branch block When compared with ECG of 22-Sep-2023 10:15, No significant change since Confirmed by Calender Emeline [phone]) on 05/18/2024 8:27:24 AM    Studies Reviewed   Echocardiogram 02/07/2019:   1. The left ventricle has normal systolic function with an ejection  fraction of 60-65%. The cavity size was normal. There is mildly increased  left ventricular wall thickness. Left ventricular diastolic parameters  were normal. No evidence of left  ventricular regional wall motion abnormalities.   2. The right ventricle has normal systolic function. The cavity was  normal. There is no increase in right ventricular wall thickness.   3. The mitral valve is abnormal. There is systolic bowing of the mitral  leaflets, without true prolapse. Mild thickening of the mitral  valve  leaflet.   4. The tricuspid valve is grossly normal.   5. The aortic valve is tricuspid. No stenosis of the aortic valve.   6. The aorta is normal unless otherwise noted.   7. The inferior vena cava was normal in size with <50% respiratory  variability.   8. The average left ventricular global longitudinal strain is -23.5 %.    Risk Assessment/Calculations             ASCVD risk score: The ASCVD Risk score (Arnett DK, et al., 2019) failed to calculate for the following reasons:   Cannot find a previous HDL lab   Cannot find a previous total cholesterol lab   ASSESSMENT  Mitral valve  prolapse most recent echo in 2020 with bowing of leaflets without true prolapse. Palpitations suspected secondary to PVCs without definitive diagnosis currently on metoprolol  succinate 25 mg daily.  No monitor on file. Right bundle branch block Atypical chest discomfort, likely noncardiac chest discomfort is worse with exertion and relieved with rest but also is worse with laying flat and has been improving after starting on GERD medications.  Had a coronary calcium score of 0 one year ago.  Symptoms have not been changing.  Plan  Echocardiogram to follow-up on the mitral valve and evaluate for any wall motion abnormalities Refill patient's metoprolol  succinate 25 mg If chest discomfort persists or echocardiogram is concerning for wall motion abnormalities then would consider pursuing a stress echocardiogram versus coronary CTA  Follow up: 1 year          Signed, Emeline FORBES Calender, DO

## 2024-05-18 NOTE — Patient Instructions (Signed)
 Medication Instructions:  Metoprolol  succinate refill   Testing/Procedures: Your physician has requested that you have an echocardiogram. Echocardiography is a painless test that uses sound waves to create images of your heart. It provides your doctor with information about the size and shape of your heart and how well your heart's chambers and valves are working. This procedure takes approximately one hour. There are no restrictions for this procedure. Please do NOT wear cologne, perfume, aftershave, or lotions (deodorant is allowed). Please arrive 15 minutes prior to your appointment time.  Please note: We ask at that you not bring children with you during ultrasound (echo/ vascular) testing. Due to room size and safety concerns, children are not allowed in the ultrasound rooms during exams. Our front office staff cannot provide observation of children in our lobby area while testing is being conducted. An adult accompanying a patient to their appointment will only be allowed in the ultrasound room at the discretion of the ultrasound technician under special circumstances. We apologize for any inconvenience.   Follow-Up: At [facility], you and your health needs are our priority.  As part of our continuing mission to provide you with exceptional heart care, our providers are all part of one team.  This team includes your primary Cardiologist (physician) and Advanced Practice Providers or APPs (Physician Assistants and Nurse Practitioners) who all work together to provide you with the care you need, when you need it.  Your next appointment:   1 year(s)  Provider:   Emeline FORBES Calender, DO    We recommend signing up for the patient portal called MyChart.  Sign up information is provided on this After Visit Summary.  MyChart is used to connect with patients for Virtual Visits (Telemedicine).  Patients are able to view lab/test results, encounter notes, upcoming appointments, etc.  Non-urgent  messages can be sent to your provider as well.   To learn more about what you can do with MyChart, go to [email or web address].

## 2024-06-01 DIAGNOSIS — M546 Pain in thoracic spine: Secondary | ICD-10-CM | POA: Diagnosis not present

## 2024-06-01 DIAGNOSIS — M9902 Segmental and somatic dysfunction of thoracic region: Secondary | ICD-10-CM | POA: Diagnosis not present

## 2024-06-01 DIAGNOSIS — M9901 Segmental and somatic dysfunction of cervical region: Secondary | ICD-10-CM | POA: Diagnosis not present

## 2024-06-01 DIAGNOSIS — M542 Cervicalgia: Secondary | ICD-10-CM | POA: Diagnosis not present

## 2024-06-13 ENCOUNTER — Other Ambulatory Visit: Payer: Self-pay | Admitting: Family Medicine

## 2024-06-13 DIAGNOSIS — Z1231 Encounter for screening mammogram for malignant neoplasm of breast: Secondary | ICD-10-CM

## 2024-06-27 ENCOUNTER — Ambulatory Visit (HOSPITAL_COMMUNITY)
Admission: RE | Admit: 2024-06-27 | Discharge: 2024-06-27 | Disposition: A | Discharge status: Home / Self Care | Source: Ambulatory Visit | Attending: Internal Medicine | Admitting: Internal Medicine

## 2024-06-27 DIAGNOSIS — I341 Nonrheumatic mitral (valve) prolapse: Secondary | ICD-10-CM | POA: Diagnosis present

## 2024-06-27 LAB — ECHOCARDIOGRAM COMPLETE
Area-P 1/2: 3.99 cm2
S' Lateral: 2.74 cm

## 2024-06-28 ENCOUNTER — Ambulatory Visit: Payer: Self-pay | Admitting: Internal Medicine

## 2024-06-29 ENCOUNTER — Ambulatory Visit

## 2024-07-06 ENCOUNTER — Ambulatory Visit
Admission: RE | Admit: 2024-07-06 | Discharge: 2024-07-06 | Disposition: A | Source: Ambulatory Visit | Attending: Family Medicine | Admitting: Family Medicine

## 2024-07-06 DIAGNOSIS — Z1231 Encounter for screening mammogram for malignant neoplasm of breast: Secondary | ICD-10-CM
